# Patient Record
Sex: Female | Born: 1965 | Race: White | Hispanic: No | Marital: Married | Smoking: Former smoker
Health system: Southern US, Community
[De-identification: ages and names within clinical notes are randomized; demographics above are authoritative.]

## PROBLEM LIST (undated history)

## (undated) DIAGNOSIS — M1990 Unspecified osteoarthritis, unspecified site: Secondary | ICD-10-CM

## (undated) DIAGNOSIS — E559 Vitamin D deficiency, unspecified: Secondary | ICD-10-CM

## (undated) DIAGNOSIS — R1900 Intra-abdominal and pelvic swelling, mass and lump, unspecified site: Secondary | ICD-10-CM

## (undated) DIAGNOSIS — C801 Malignant (primary) neoplasm, unspecified: Secondary | ICD-10-CM

## (undated) DIAGNOSIS — G43909 Migraine, unspecified, not intractable, without status migrainosus: Secondary | ICD-10-CM

## (undated) DIAGNOSIS — J302 Other seasonal allergic rhinitis: Secondary | ICD-10-CM

## (undated) DIAGNOSIS — R140 Abdominal distension (gaseous): Secondary | ICD-10-CM

## (undated) DIAGNOSIS — K5900 Constipation, unspecified: Secondary | ICD-10-CM

## (undated) DIAGNOSIS — N6310 Unspecified lump in the right breast, unspecified quadrant: Secondary | ICD-10-CM

## (undated) DIAGNOSIS — Z9889 Other specified postprocedural states: Secondary | ICD-10-CM

## (undated) DIAGNOSIS — T7840XA Allergy, unspecified, initial encounter: Secondary | ICD-10-CM

## (undated) DIAGNOSIS — R112 Nausea with vomiting, unspecified: Secondary | ICD-10-CM

## (undated) HISTORY — PX: AUGMENTATION MAMMAPLASTY: SUR837

## (undated) HISTORY — PX: BREAST SURGERY: SHX581

## (undated) HISTORY — DX: Unspecified lump in the right breast, unspecified quadrant: N63.10

## (undated) HISTORY — PX: APPENDECTOMY: SHX54

## (undated) HISTORY — DX: Allergy, unspecified, initial encounter: T78.40XA

## (undated) HISTORY — DX: Malignant (primary) neoplasm, unspecified: C80.1

---

## 2012-04-23 HISTORY — PX: BREAST BIOPSY: SHX20

## 2012-07-29 ENCOUNTER — Ambulatory Visit: Payer: Self-pay | Admitting: Obstetrics & Gynecology

## 2012-07-31 ENCOUNTER — Encounter: Payer: Self-pay | Admitting: General Surgery

## 2012-07-31 ENCOUNTER — Ambulatory Visit (INDEPENDENT_AMBULATORY_CARE_PROVIDER_SITE_OTHER): Payer: PRIVATE HEALTH INSURANCE | Admitting: General Surgery

## 2012-07-31 VITALS — BP 100/60 | HR 76 | Resp 16 | Ht 68.0 in | Wt 127.0 lb

## 2012-07-31 DIAGNOSIS — N63 Unspecified lump in unspecified breast: Secondary | ICD-10-CM

## 2012-07-31 DIAGNOSIS — R920 Mammographic microcalcification found on diagnostic imaging of breast: Secondary | ICD-10-CM

## 2012-07-31 NOTE — Patient Instructions (Addendum)
Advised to have biopsy of right breast in our office at a later date. Pt had another appointment today

## 2012-07-31 NOTE — Progress Notes (Signed)
Patient ID: Sheryl Pratt, female   DOB: 09-16-1965, 47 y.o.   MRN: [id number]  Chief Complaint  Patient presents with  . Other    new patient mammogram follow up     HPI Sheryl Pratt is a 47 y.o. female who presents as a new patient to our office. She is following up from a mammogram that was done at [facility] on 07/23/12 with additional views at [facility] of the right breast on 07/29/12 with birad category 4. No prior problems with her breasts. Had breast argumentation surgery 18 years ago. Has bilateral saline implants  HPI  History reviewed. No pertinent past medical history.  Past Surgical History  Procedure Laterality Date  . Appendectomy    . Breast surgery  18 years ago    augmentation.    Family History  Problem Relation Age of Onset  . Cancer Brother     pancreatic    Social History History  Substance Use Topics  . Smoking status: Current Every Day Smoker -- 1.00 packs/day  . Smokeless tobacco: Never Used  . Alcohol Use: No    No Known Allergies  No current outpatient prescriptions on file.   No current facility-administered medications for this visit.    Review of Systems Review of Systems  Constitutional: Negative.   Respiratory: Negative.   Cardiovascular: Negative.     Blood pressure 100/60, pulse 76, resp. rate 16, height 5\' 8"  (1.727 m), weight 127 lb (57.607 kg), last menstrual period 07/02/2012.  Physical Exam Physical Exam  Constitutional: She appears well-developed and well-nourished.  Eyes: Conjunctivae are normal. No scleral icterus.  Neck: Trachea normal. No mass and no thyromegaly present.  Cardiovascular: Normal rate, regular rhythm, normal heart sounds and normal pulses.   No murmur heard. Pulmonary/Chest: Effort normal and breath sounds normal. Right breast exhibits no inverted nipple, no mass, no nipple discharge, no skin change and no tenderness. Left breast exhibits no inverted nipple, no mass, no nipple discharge, no skin change and no  tenderness. Breasts are symmetrical.  Both implants appear to be intact on physical exam.   Abdominal: Soft. Normal appearance and bowel sounds are normal. There is no tenderness. A hernia is present.  Small umbilical hernia noted on exam. Partially reducible.   Lymphadenopathy:    She has no cervical adenopathy.    She has no axillary adenopathy.    Data Reviewed Mammogram shoiws  Cluster of clacifications in right breast uoq and a nodule lateral right breast. US showed a cyst lateral right breast and a lobulated mass at 11 o'cl.  Assessment    Feel core biopsy of the mas in right breast with clip placement and subsequent mammogram would be reasonable. Location of calcifications and the mass seen on [place] appears to be same.     Plan    Core biopsy right breast mass.        Koraima Albertsen G 08/01/2012, 7:06 AM

## 2012-08-01 ENCOUNTER — Encounter: Payer: Self-pay | Admitting: General Surgery

## 2012-08-06 ENCOUNTER — Ambulatory Visit (INDEPENDENT_AMBULATORY_CARE_PROVIDER_SITE_OTHER): Payer: PRIVATE HEALTH INSURANCE | Admitting: General Surgery

## 2012-08-06 ENCOUNTER — Encounter: Payer: Self-pay | Admitting: General Surgery

## 2012-08-06 VITALS — BP 98/64 | HR 78 | Resp 16 | Ht 68.0 in | Wt 128.0 lb

## 2012-08-06 DIAGNOSIS — N63 Unspecified lump in unspecified breast: Secondary | ICD-10-CM

## 2012-08-06 DIAGNOSIS — N6310 Unspecified lump in the right breast, unspecified quadrant: Secondary | ICD-10-CM | POA: Insufficient documentation

## 2012-08-06 NOTE — Progress Notes (Signed)
Patient ID: Sheryl Pratt, female   DOB: 07-30-1965, 47 y.o.   MRN: [id number]  Chief Complaint  Patient presents with  . Procedure    right breast core biopsy    HPI Sheryl Pratt is a 47 y.o. female who presents today for a right breast core biopsy.  HPI  Past Medical History  Diagnosis Date  . Breast mass, right     Past Surgical History  Procedure Laterality Date  . Appendectomy    . Breast surgery  18 years ago    augmentation.    Family History  Problem Relation Age of Onset  . Cancer Brother     pancreatic    Social History History  Substance Use Topics  . Smoking status: Current Every Day Smoker -- 1.00 packs/day  . Smokeless tobacco: Never Used  . Alcohol Use: No    No Known Allergies  No current outpatient prescriptions on file.   No current facility-administered medications for this visit.    Review of Systems Review of Systems  Constitutional: Negative.   Respiratory: Negative.   Cardiovascular: Negative.     Blood pressure 98/64, pulse 78, resp. rate 16, height 5\' 8"  (1.727 m), weight 128 lb (58.06 kg), last menstrual period 07/02/2012.  Physical Exam Physical Exam  Data Reviewed    Assessment    Right breast mass     Plan    Biopsy performed.        Sheryl Pratt G 08/06/2012, 1:02 PM

## 2012-08-06 NOTE — Patient Instructions (Addendum)
CARE AFTER BREAST BIOPSY  1. Leave the dressing on that your doctor applied after surgery. It is waterproof. You may bathe, shower and/or swim. The dressing will probably remain intact until your return office visit. If the dressing comes off, you will see small strips of tape against your skin on the incision. Do not remove these strips.  2. You may want to use a gauze,cloth or similar protection in your bra to prevent rubbing against your dressing and incision. This is not necessary, but you may feel more comfortable doing so.  3. It is recommended that you wear a bra day and night to give support to the breast. This will prevent the weight of the breast from pulling on the incision.  4. Your breast will feel hard and lumpy under the incision. Do not be alarmed. This is the underlying stitching of tissue. Softening of this tissue will occur in time.  5. Make sure you call the office and schedule an appointment in one week after your surgery. The office phone number is [phone]. The nurses at [facility] may have already done this for you.  6. You will notice about a week after your office visit that the strips of the tape on your incision will begin to loosen. These may then be removed.  7. Report to your doctor any of the following:  * Severe pain not relieved by your pain medication  *Redness of the incision  * Drainage from the incision  *Fever greater than 101 degrees  Patient to have a unilateral right breast diagnostic mammogram in one month. This has been arranged at [facility] for 09-09-12 at 10 am. She is aware of date, time, and instructions.

## 2012-08-07 ENCOUNTER — Encounter: Payer: Self-pay | Admitting: General Surgery

## 2012-08-07 ENCOUNTER — Telehealth: Payer: Self-pay | Admitting: *Deleted

## 2012-08-07 LAB — PATHOLOGY

## 2012-08-07 NOTE — Telephone Encounter (Signed)
Left message on cell  Number.

## 2012-08-07 NOTE — Telephone Encounter (Signed)
Message copied by Currie Paris on Thu Aug 07, 2012  5:21 PM ------      Message from: Kieth Brightly      Created: Thu Aug 07, 2012  3:49 PM       Please let pt pt know the pathology was normal. Arrange for 2 month f/u ------

## 2012-08-11 ENCOUNTER — Encounter: Payer: Self-pay | Admitting: General Surgery

## 2012-08-20 ENCOUNTER — Telehealth: Payer: Self-pay | Admitting: *Deleted

## 2012-08-20 NOTE — Telephone Encounter (Signed)
Patient called stating insurance will not pay for mammogram. This patient wants to know if she can cancel mammogram (scheduled for 09-09-12) if it was not necessary. Patient's office visit follow up is scheduled for 09-17-12. Please advise.

## 2012-08-25 ENCOUNTER — Encounter: Payer: Self-pay | Admitting: General Surgery

## 2012-09-17 ENCOUNTER — Ambulatory Visit: Payer: PRIVATE HEALTH INSURANCE | Admitting: General Surgery

## 2012-09-24 ENCOUNTER — Encounter: Payer: Self-pay | Admitting: *Deleted

## 2013-04-07 ENCOUNTER — Ambulatory Visit: Payer: Self-pay | Admitting: Obstetrics & Gynecology

## 2013-04-07 LAB — CBC
MCH: 32.5 pg (ref 26.0–34.0)
MCHC: 33.9 g/dL (ref 32.0–36.0)
MCV: 96 fL (ref 80–100)
RDW: 13.3 % (ref 11.5–14.5)
WBC: 7.6 10*3/uL (ref 3.6–11.0)

## 2013-04-14 ENCOUNTER — Ambulatory Visit: Payer: Self-pay | Admitting: Obstetrics & Gynecology

## 2013-04-23 HISTORY — PX: VAGINAL HYSTERECTOMY: SUR661

## 2013-05-27 LAB — PATHOLOGY REPORT

## 2013-09-08 ENCOUNTER — Telehealth: Payer: Self-pay | Admitting: *Deleted

## 2013-09-08 NOTE — Telephone Encounter (Signed)
Called pt and left message to notify of balance after insurance payment

## 2014-02-22 ENCOUNTER — Encounter: Payer: Self-pay | Admitting: General Surgery

## 2014-08-14 NOTE — Op Note (Signed)
PATIENT NAME:  Sheryl Pratt, Sheryl Pratt MR#:  [id number] DATE OF BIRTH:  Jan 02, 1966  DATE OF PROCEDURE:  04/14/2013  PREOPERATIVE DIAGNOSIS:  Pelvic organ prolapse with uterine prolapse.  POSTOPERATIVE DIAGNOSIS:  Pelvic organ prolapse with uterine prolapse.  PROCEDURES PERFORMED: 1.  Total vaginal hysterectomy.  2.  Modified McCall culdoplasty.   SURGEON: Glean Salen, MD  ANESTHESIA: Spinal.   ESTIMATED BLOOD LOSS: 50 mL  COMPLICATIONS: None.   FINDINGS: Uterine prolapse with normal ovaries.   DISPOSITION: Recovery room stable.   TECHNIQUE: The patient is prepped and draped in the usual sterile fashion after adequate anesthesia is obtained in the dorsal lithotomy position. Foley catheter is inserted. The tenaculum is used to grasp the cervix with gentle traction applied. The circumference of the cervix is infiltrated with 1% lidocaine with epinephrine and then is incised with Bovie electrocautery. Posterior peritoneum is then dissected and penetrated and a long weighted speculum is placed. The uterosacral ligaments are clamped, transected and suture ligated and then sutured to the vaginal cuff. The uterine arteries are clamped, transected and suture ligated. Anterior peritoneum is penetrated with a retractor placed. The remaining portion of the broad ligament complex and adnexa is then sequentially clamped, transected and suture ligated until the uterus is completely amputated. Excellent hemostasis is noted and there are no apparent injuries to ureter or bowel or bladder.   The peritoneum is closed with a 1 Vicryl suture in a pursestring fashion incorporating the anterior peritoneum, posterior peritoneum and peritoneum along the sidewall near the adnexa. An additional Ethibond suture is placed in the posterior peritoneum to plicate the uterosacral ligaments. The vaginal mucosa is then closed using a 2-0 Vicryl suture in a running locking fashion. The vaginal cavity is irrigated and a sponge with  AVC cream applied, is placed vaginally and for overnight use. Foley catheter is also left in. The patient goes to the recovery room in stable condition. All sponge, instrument and needle counts are correct.   ____________________________ R. Barnett Applebaum, MD rph:ce D: 04/14/2013 15:16:10 ET T: 04/14/2013 15:29:50 ET JOB#: [id number]  cc: Glean Salen, MD, <Dictator> Gae Dry MD ELECTRONICALLY SIGNED 04/29/2013 7:17

## 2019-02-04 ENCOUNTER — Encounter: Payer: Self-pay | Admitting: Family Medicine

## 2019-02-04 ENCOUNTER — Ambulatory Visit (INDEPENDENT_AMBULATORY_CARE_PROVIDER_SITE_OTHER): Payer: 59 | Admitting: Family Medicine

## 2019-02-04 ENCOUNTER — Telehealth: Payer: Self-pay | Admitting: Family Medicine

## 2019-02-04 ENCOUNTER — Other Ambulatory Visit: Payer: Self-pay

## 2019-02-04 VITALS — BP 115/70 | HR 67 | Temp 97.8°F | Resp 16 | Ht 67.0 in | Wt 144.6 lb

## 2019-02-04 DIAGNOSIS — Z90710 Acquired absence of both cervix and uterus: Secondary | ICD-10-CM | POA: Diagnosis not present

## 2019-02-04 DIAGNOSIS — F17200 Nicotine dependence, unspecified, uncomplicated: Secondary | ICD-10-CM | POA: Insufficient documentation

## 2019-02-04 DIAGNOSIS — Z7689 Persons encountering health services in other specified circumstances: Secondary | ICD-10-CM | POA: Diagnosis not present

## 2019-02-04 DIAGNOSIS — Z789 Other specified health status: Secondary | ICD-10-CM | POA: Diagnosis not present

## 2019-02-04 DIAGNOSIS — Z23 Encounter for immunization: Secondary | ICD-10-CM

## 2019-02-04 DIAGNOSIS — Z9049 Acquired absence of other specified parts of digestive tract: Secondary | ICD-10-CM

## 2019-02-04 MED ORDER — ZOSTER VAC RECOMB ADJUVANTED 50 MCG/0.5ML IM SUSR: 0.5000 mL | Freq: Once | INTRAMUSCULAR | 0 refills | Status: AC

## 2019-02-04 NOTE — Telephone Encounter (Signed)
Error

## 2019-02-04 NOTE — Progress Notes (Signed)
New patient office visit note:  Impression and Recommendations:    1. Establishing care with new doctor, encounter for   2. S/P hysterectomy-without oophrectomy   3. Hx of appendectomy   4. Vegan diet   5. Smoker- 10 pack yr or less   6. Need for viral immunization     Encounter to Establish Care with New Doctor - Extensive discussion held with patient regarding establishing as a new patient.  Discussed policies and practices here at the clinic, and answered all questions about care team and health management during appointment.  - Discussed need for patient to continue to obtain management and screenings with all established specialists.  Educated patient at length about the critical importance of keeping health maintenance up to date.  - Discussed need to obtain baseline fasting lab work.  - Discussed need for immunizations with patient during appointment today. - Influenza vaccination declined in office today, but patient indicates interest in obtaining elsewhere.  - Extensive education provided regarding immunizations and screenings.  - Participated in lengthy conversation and all questions were answered.   Female Health -  recommended keeping follow-up with OBGYN. - Patient understands if she is interested in pursuing hormone replacement- she will need to obtain from GYN    Smoking Cessation Counseling - Current Smoker, 10 Pack-Years - Told pt to think seriously about quitting smoking completely!   - Told pt it is very important for his/her health and well being.   - Smoking cessation instruction/ counseling given of at least 5 minutes:  counseled patient on the dangers of tobacco use and reviewed strategies to maximize success  - Discussed with patient that there are multiple treatments to aid in quitting smoking, however I explained none will work unless pt really wants to quit  - Told to call 1-800-QUIT-NOW 347-210-3997) for free smoking cessation  counseling and support, or pt can go online to www.heart.org - the American Heart Association website and search "quit smoking ".   - To replace smoking habit with other stress relief activities, encouraged patient to engage in meditation, and utilize assistance such as free meditation apps available for the phone.   Health Counseling & Preventative Health Maintenance - Advised patient to continue working toward exercising to improve overall mental, physical, and emotional health.    - Reviewed the "spokes of the wheel" of mood and health management.  Stressed the importance of ongoing prudent habits, including regular exercise, appropriate sleep hygiene, healthful dietary habits, and prayer/meditation to relax.  - Encouraged patient to engage in daily physical activity, especially a formal exercise routine.  Recommended that the patient eventually strive for at least 150 minutes of moderate cardiovascular activity per week according to guidelines established by the [facility].   - Healthy dietary habits encouraged, including low-carb, and high amounts of lean protein in diet.   - Patient should also consume adequate amounts of water.  - Health counseling performed.  All questions answered.   Education and routine counseling performed. Handouts provided.   Recommendations - Return in near future for CPE and full fasting lab work same day. - Discussed that all historical records should be sent to clinic as advised.   Meds ordered this encounter  Medications  . Zoster Vaccine Adjuvanted [facility]) injection    Sig: Inject 0.5 mLs into the muscle once for 1 dose.    Dispense:  0.5 mL    Refill:  0   Gross side effects, risk and benefits, and  alternatives of medications discussed with patient.  Patient is aware that all medications have potential side effects and we are unable to predict every side effect or drug-drug interaction that may occur.  Expresses verbal understanding and consents to  current therapy plan and treatment regimen.  Return for f/up for CPE and FBW same day next available .  Please see AVS handed out to patient at the end of our visit for further patient instructions/ counseling done pertaining to today's office visit.    Note:  This document was prepared using Dragon voice recognition software and may include unintentional dictation errors.   This document serves as a record of services personally performed by [name], DO. It was created on her behalf by Toni Amend, a trained medical scribe. The creation of this record is based on the scribe's personal observations and the provider's statements to them.   I have reviewed the above medical documentation for accuracy and completeness and I concur.  [name], DO 02/07/2019 2:46 PM       ---------------------------------------------------------------------------------------------------------------------------------------------------------------------------------------------    Subjective:    Chief complaint:   Chief Complaint  Patient presents with  . New Patient (Initial Visit)     HPI: Sheryl Pratt is a pleasant 53 y.o. female who presents to [facility] today to review their medical history with me and establish care.   I asked the patient to review their chronic problem list with me to ensure everything was updated and accurate.    All recent office visits with other providers, any medical records that patient brought in etc  - I reviewed today.     We asked pt to get [place] their medical records from [facility] providers/ specialists that they had seen within the past 3-5 years- if they are in private practice and/or do not work for [employer], [facility], [facility], [facility] or [employer] owned practice.  Told them to call their specialists to clarify this if they are not sure.    Reason for Establishing Care: notes doesn't normally go to the doctor.  Indicates  she's never really felt like she needed a doctor.  Social History Recently moved from [place].  Married to husband [name], monogamous. Married for 31 years, together prior to that. 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  Worked outside the home until her sister "got really sick, then sister's husband got myasthenia gravis and also got really sick."  Patient states she stays with them a lot now.  "They didn't have children, so I'm kind of all they've got."  Notes has been taking care of them for four years and worries about her sister and BIL a lot.  "They rely on me quite a bit.  They live in a huge [employer], with all this stuff they never use, and I don't think they want to face how serious it is."  "They don't want to listen to me and that's my biggest stress.  I mean I don't have to do it, but you know."  States her sister is 34 years older than her and "kind of like a mother figure to me and raised me."  "My children are like her grandchildren."  Has two kids, son and daughter. No grandchildren, but has "grand puppies."  Diet Is a vegan. Notes when she's at home, she's 100% vegan. Says she does not consume dairy.  Says "I'm an outside person,  I garden all the time, in sunlight a lot." Has a vegetable garden and is into flowers, "I do it all."  Took caffeine out of her diet but still doesn't sleep that well.  Cigarette Smoking - has quit many times Current smoker; started at age 29. Notes "I've been an on and off smoker all these years." Thinks she's smoking two packs per week these days. Thinks she smokes six cigarettes per day, quarter pack. 10 pack-year history.  Smoked a pack per day when she was "really young."  Quits for years at a time and starts back "because I love 'em." Mostly a quarter pack per day for many years.  Has used several methods to quit, including Wellbutrin, the patch. Only took  Wellbutrin 2-3 days and quit because she didn't sleep. Has not tried Chantix in the past.  Alcohol Use Drinks occasionally.  Family History Has a sister with PSP (progressive supranuclear palsy). Sister is wheelchair bound, has high blood pressure now. Is completely in a wheelchair and requires people to feed her now. Notes "was more of a health nut than me, a beautiful woman with a lot of energy."  Notes several siblings; "there are four girls, and there were three boys, but one has passed away of pancreatic cancer in 1993-08-10."  Notes brother who died at age 30 was a Insurance underwriter.  Past Medical History Never been told she has diabetes, high blood pressure, high cholesterol.  Notes "I've been fortunate, I just haven't been sick."  Occasionally takes ibuprofen or tylenol for headache.  Has seasonal allergies and "sometimes takes a benadryl, but that's all I take."  Has not been having regular lab work.  Hasn't been to the doctor since 2015-08-11.  Notes codeine has made her nauseous in the past.  - Female Health Had hysterectomy but still has ovaries.  Notes she wanted to keep her ovaries because "I really didn't want to have to take hormone replacement."  "I really don't like taking medicine."  Used to follow up with OBGYN. Last pap smear obtained in 2015-08-11.   - Sleep & Mood Says "I've never slept okay."  Notes typically can go to sleep fine, but only sleeps two hours at a time.  Says "if I slept six hours, that would be a good night for me; but I've always been that way in my adult life, I don't remember anything different."  Notes "my mother was that way, not a big sleeper either."  Confirms she does feel some anxiety and depression about her sister's health.  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, "I'm pretty physical," and "sometimes if I'm in the house for a couple of days and not getting  exercise, I feel a little stressed, but I do Pilates then."  Says she doesn't exercise "unless I have to."  - Thinning Hair Notes she's losing hair and states "I'm getting like, spots on my head."  States "it's really thin."  Says she takes Biotin but "I don't think it's helping much."    Wt Readings from Last 3 Encounters:  02/04/19 144 lb 9.6 oz (65.6 kg)  08/06/12 128 lb (58.1 kg)  07/31/12 127 lb (57.6 kg)   BP Readings from Last 3 Encounters:  02/04/19 115/70  08/06/12 98/64  07/31/12 100/60   Pulse Readings from Last 3 Encounters:  02/04/19 67  08/06/12 78  07/31/12 76   BMI Readings  from Last 3 Encounters:  02/04/19 22.65 kg/m  08/06/12 19.46 kg/m  07/31/12 19.31 kg/m    Patient Care Team    Relationship Specialty Notifications Start End  [name], DO PCP - General Family Medicine  02/04/19   Christene Lye, MD  General Surgery  07/31/12   [facility]    02/04/19     Patient Active Problem List   Diagnosis Date Noted  . Hx of appendectomy 02/04/2019  . Status post hysterectomy with oophorectomy-2015 02/04/2019  . Vegan diet 02/04/2019  . Smoker- 10 pack yr or less 02/04/2019  . Breast mass, right   . Breast microcalcification, mammographic 07/31/2012  . Lump or mass in breast 07/31/2012       As reported by pt:  Past Medical History:  Diagnosis Date  . Allergy   . Breast mass, right      Past Surgical History:  Procedure Laterality Date  . ABDOMINAL HYSTERECTOMY  2015  . APPENDECTOMY    . BREAST SURGERY  18 years ago   augmentation.     Family History  Problem Relation Age of Onset  . Cancer Brother        pancreatic     Social History   Substance and Sexual Activity  Drug Use No     Social History   Substance and Sexual Activity  Alcohol Use No     Social History   Tobacco Use  Smoking Status Current Every Day Smoker  . Packs/day: 1.00  Smokeless Tobacco Never Used     Current  Meds  Medication Sig  . Ascorbic Acid (VITAMIN C) 100 MG tablet Take 100 mg by mouth daily.  . Biotin 10 MG CAPS Take 1 capsule by mouth daily.  . cyanocobalamin 100 MCG tablet Take 100 mcg by mouth daily.  . Multiple Vitamin (MULTIVITAMIN) tablet Take 1 tablet by mouth daily.    Allergies: Codeine and Wellbutrin [bupropion]   Review of Systems  Constitutional: Negative for chills, diaphoresis, fever, malaise/fatigue and weight loss.  HENT: Negative for congestion, sore throat and tinnitus.   Eyes: Negative for blurred vision, double vision and photophobia.  Respiratory: Negative for cough and wheezing.   Cardiovascular: Negative for chest pain and palpitations.  Gastrointestinal: Negative for blood in stool, diarrhea, nausea and vomiting.  Genitourinary: Negative for dysuria, frequency and urgency.  Musculoskeletal: Negative for joint pain and myalgias.  Skin: Negative for itching and rash.  Neurological: Negative for dizziness, focal weakness, weakness and headaches.  Endo/Heme/Allergies: Negative for environmental allergies and polydipsia. Does not bruise/bleed easily.  Psychiatric/Behavioral: Negative for depression and memory loss. The patient is not nervous/anxious and does not have insomnia.         Objective:   Blood pressure 115/70, pulse 67, temperature 97.8 F (36.6 C), resp. rate 16, height 5\' 7"  (1.702 m), weight 144 lb 9.6 oz (65.6 kg), last menstrual period 07/02/2012, SpO2 99 %. Body mass index is 22.65 kg/m. General: Well Developed, well nourished, and in no acute distress.  Neuro: Alert and oriented x3, extra-ocular muscles intact, sensation grossly intact.  HEENT:[place]/AT, PERRLA, neck supple, No carotid bruits Skin: no gross rashes  Cardiac: Regular rate and rhythm Respiratory: Essentially clear to auscultation bilaterally. Not using accessory muscles, speaking in full sentences.  Abdominal: not grossly distended Musculoskeletal: Ambulates w/o diff, FROM *  4 ext.  Vasc: less 2 sec cap RF, warm and pink  Psych:  No HI/SI, judgement and insight good, Euthymic mood. Full Affect.  No results found for this or any previous visit (from the past 2160 hour(s)).

## 2019-02-04 NOTE — Patient Instructions (Signed)
For meditation, look into the Calm App and Headspace App for the phone. You may also search for stress relief meditations on YouTube and online.  Please realize, EXERCISE IS MEDICINE!  -  American Heart Association [facility]) guidelines for exercise : If you are in good health, without any medical conditions, you should engage in 150-300 minutes of moderate intensity aerobic activity per week.  This means you should be huffing and puffing throughout your workout.   Engaging in regular exercise will improve brain function and memory, as well as improve mood, boost immune system and help with weight management.  As well as the other, more well-known effects of exercise such as decreasing blood sugar levels, decreasing blood pressure,  and decreasing bad cholesterol levels/ increasing good cholesterol levels.     -  The AHA strongly endorses consumption of a diet that contains a variety of foods from all the food categories with an emphasis on fruits and vegetables; fat-free and low-fat dairy products; cereal and grain products; legumes and nuts; and fish, poultry, and/or extra lean meats.    Excessive food intake, especially of foods high in saturated and trans fats, sugar, and salt, should be avoided.    Adequate water intake of roughly 1/2 of your weight in pounds, should equal the ounces of water per day you should drink.  So for instance, if you're 200 pounds, that would be 100 ounces of water per day.         Mediterranean Diet  Why follow it? Research shows  Those who follow the Mediterranean diet have a reduced risk of heart disease   The diet is associated with a reduced incidence of Parkinson's and Alzheimer's diseases  People following the diet may have longer life expectancies and lower rates of chronic diseases   The Dietary Guidelines for Americans recommends the Mediterranean diet as an eating plan to promote health and prevent disease  What Is the Mediterranean Diet?   Healthy  eating plan based on typical foods and recipes of Mediterranean-style cooking  The diet is primarily a plant based diet; these foods should make up a majority of meals   Starches - Plant based foods should make up a majority of meals - They are an important sources of vitamins, minerals, energy, antioxidants, and fiber - Choose whole grains, foods high in fiber and minimally processed items  - Typical grain sources include wheat, oats, barley, corn, Shan rice, bulgar, farro, millet, polenta, couscous  - Various types of beans include chickpeas, lentils, fava beans, black beans, white beans   Fruits  Veggies - Large quantities of antioxidant rich fruits & veggies; 6 or more servings  - Vegetables can be eaten raw or lightly drizzled with oil and cooked  - Vegetables common to the traditional Mediterranean Diet include: artichokes, arugula, beets, broccoli, brussel sprouts, cabbage, carrots, celery, collard greens, cucumbers, eggplant, kale, leeks, lemons, lettuce, mushrooms, okra, onions, peas, peppers, potatoes, pumpkin, radishes, rutabaga, shallots, spinach, sweet potatoes, turnips, zucchini - Fruits common to the Mediterranean Diet include: apples, apricots, avocados, cherries, clementines, dates, figs, grapefruits, grapes, melons, nectarines, oranges, peaches, pears, pomegranates, strawberries, tangerines  Fats - Replace butter and margarine with healthy oils, such as olive oil, canola oil, and tahini  - Limit nuts to no more than a handful a day  - Nuts include walnuts, almonds, pecans, pistachios, pine nuts  - Limit or avoid candied, honey roasted or heavily salted nuts - Olives are central to the Mediterranean diet - can  be eaten whole or used in a variety of dishes   Meats Protein - Limiting red meat: no more than a few times a month - When eating red meat: choose lean cuts and keep the portion to the size of deck of cards - Eggs: approx. 0 to 4 times a week  - Fish and lean poultry: at  least 2 a week  - Healthy protein sources include, chicken, [place], lean beef, lamb - Increase intake of seafood such as tuna, salmon, trout, mackerel, shrimp, scallops - Avoid or limit high fat processed meats such as sausage and bacon  Dairy - Include moderate amounts of low fat dairy products  - Focus on healthy dairy such as fat free yogurt, skim milk, low or reduced fat cheese - Limit dairy products higher in fat such as whole or 2% milk, cheese, ice cream  Alcohol - Moderate amounts of red wine is ok  - No more than 5 oz daily for women (all ages) and men older than age 80  - No more than 10 oz of wine daily for men younger than 5  Other - Limit sweets and other desserts  - Use herbs and spices instead of salt to flavor foods  - Herbs and spices common to the traditional Mediterranean Diet include: basil, bay leaves, chives, cloves, cumin, fennel, garlic, lavender, marjoram, mint, oregano, parsley, pepper, rosemary, sage, savory, sumac, tarragon, thyme   Its not just a diet, its a lifestyle:   The Mediterranean diet includes lifestyle factors typical of those in the region   Foods, drinks and meals are best eaten with others and savored  Daily physical activity is important for overall good health  This could be strenuous exercise like running and aerobics  This could also be more leisurely activities such as walking, housework, yard-work, or taking the stairs  Moderation is the key; a balanced and healthy diet accommodates most foods and drinks  Consider portion sizes and frequency of consumption of certain foods   Meal Ideas & Options:   Breakfast:  o Whole wheat toast or whole wheat English muffins with peanut butter & hard boiled egg o Steel cut oats topped with apples & cinnamon and skim milk  o Fresh fruit: banana, strawberries, melon, berries, peaches  o Smoothies: strawberries, bananas, greek yogurt, peanut butter o Low fat greek yogurt with blueberries and  granola  o Egg white omelet with spinach and mushrooms o Breakfast couscous: whole wheat couscous, apricots, skim milk, cranberries   Sandwiches:  o Hummus and grilled vegetables (peppers, zucchini, squash) on whole wheat bread   o Grilled chicken on whole wheat pita with lettuce, tomatoes, cucumbers or tzatziki  o Tuna salad on whole wheat bread: tuna salad made with greek yogurt, olives, red peppers, capers, green onions o Garlic rosemary lamb pita: lamb sauted with garlic, rosemary, salt & pepper; add lettuce, cucumber, greek yogurt to pita - flavor with lemon juice and black pepper   Seafood:  o Mediterranean grilled salmon, seasoned with garlic, basil, parsley, lemon juice and black pepper o Shrimp, lemon, and spinach whole-grain pasta salad made with low fat greek yogurt  o Seared scallops with lemon orzo  o Seared tuna steaks seasoned salt, pepper, coriander topped with tomato mixture of olives, tomatoes, olive oil, minced garlic, parsley, green onions and cappers   Meats:  o Herbed greek chicken salad with kalamata olives, cucumber, feta  o Red bell peppers stuffed with spinach, bulgur, lean ground beef (or lentils) &  topped with feta   o Kebabs: skewers of chicken, tomatoes, onions, zucchini, squash  o [place] burgers: made with red onions, mint, dill, lemon juice, feta cheese topped with roasted red peppers  Vegetarian o Cucumber salad: cucumbers, artichoke hearts, celery, red onion, feta cheese, tossed in olive oil & lemon juice  o Hummus and whole grain pita points with a greek salad (lettuce, tomato, feta, olives, cucumbers, red onion) o Lentil soup with celery, carrots made with vegetable broth, garlic, salt and pepper  o Tabouli salad: parsley, bulgur, mint, scallions, cucumbers, tomato, radishes, lemon juice, olive oil, salt and pepper.

## 2019-03-10 ENCOUNTER — Other Ambulatory Visit: Payer: 59

## 2019-03-10 ENCOUNTER — Ambulatory Visit (INDEPENDENT_AMBULATORY_CARE_PROVIDER_SITE_OTHER): Payer: 59 | Admitting: Family Medicine

## 2019-03-10 ENCOUNTER — Other Ambulatory Visit: Payer: Self-pay

## 2019-03-10 ENCOUNTER — Encounter: Payer: Self-pay | Admitting: Family Medicine

## 2019-03-10 VITALS — BP 96/66 | HR 76 | Temp 97.7°F | Resp 10 | Ht 67.0 in | Wt 140.7 lb

## 2019-03-10 DIAGNOSIS — Z0000 Encounter for general adult medical examination without abnormal findings: Secondary | ICD-10-CM | POA: Diagnosis not present

## 2019-03-10 DIAGNOSIS — Z1231 Encounter for screening mammogram for malignant neoplasm of breast: Secondary | ICD-10-CM | POA: Diagnosis not present

## 2019-03-10 DIAGNOSIS — L659 Nonscarring hair loss, unspecified: Secondary | ICD-10-CM

## 2019-03-10 DIAGNOSIS — Z719 Counseling, unspecified: Secondary | ICD-10-CM

## 2019-03-10 DIAGNOSIS — Z23 Encounter for immunization: Secondary | ICD-10-CM

## 2019-03-10 DIAGNOSIS — Z1211 Encounter for screening for malignant neoplasm of colon: Secondary | ICD-10-CM

## 2019-03-10 NOTE — Progress Notes (Signed)
Impression and Recommendations:    1. Encounter for wellness examination   2. Health education/counseling   3. Encounter for screening mammogram for malignant neoplasm of breast   4. Need for influenza vaccination   5. Screening for colon cancer   6. Hair loss disorder      1) Anticipatory Guidance: Discussed importance of wearing a seatbelt while driving, not texting while driving; sunscreen when outside along with yearly skin surveillance; eating a well balanced and modest diet; physical activity at least 25 minutes per day or 150 min/ week of moderate to intense activity.  - Prudent home skin surveillance discussed with patient today.  Advised patient to watch for the A,B,C,D's of skin surveillance. - Ambulatory referral to dermatology placed today due to concerns of hair loss.  Patient desires dermatology referral for treatment of balding spots.  2) Immunizations / Screenings / Labs:   All immunizations and screenings that patient agrees to, are up-to-date per recommendations or will be updated today.  Patient understands the needs for q 42mo dental and yearly vision screens which pt will schedule independently. Obtain CBC, CMP, HgA1c, Lipid panel, TSH and vit D when fasting if not already done recently.   - Fasting blood work obtained today.  - Per patient, last mammogram likely in 2014. - Need for mammogram.  Referral placed today. - Reviewed recommendation for yearly mammogram.  - Per patient, has never had a colonoscopy. - Reviewed recommendations with patient today. - Discussed colonoscopy as gold standard, vs option of ColoGuard. - Patient prefers ColoGuard.  Order placed today and information provided.  - Need for TDAP vaccination.  Per pt, has been 22 years since last time she obtained the TDAP vaccination because "I got a high fever and I ached and felt so sick." - TDAP information sheet provided to patient today.  - Per patient, obtained shingles vaccination  recently, Thursday. - Advised patient to obtain second round of vaccination as scheduled.  - Reviewed need for low-risk Hep C/HIV screen.  - Extensive education provided today regarding need for immunizations and screenings.  Lengthy conversation held and all questions answered.  - Need for influenza vaccination today.  3) Weight:   Discussed goal of losing even 5-10% of current body weight which would improve overall feelings of well being and improve objective health data significantly.   Improve nutrient density of diet through increasing intake of fruits and vegetables and decreasing saturated/trans fats, white flour products and refined sugar products.    Orders Placed This Encounter  Procedures  . MM Digital Diagnostic Bilat  . Flu Vaccine QUAD 6+ mos PF IM (Fluarix Quad PF)  . Cologuard  . Ambulatory referral to Dermatology    Return for f/up near future for discussion of labs via [facility] 1st time per pt preference.   Gross side effects, risk and benefits, and alternatives of medications discussed with patient.  Patient is aware that all medications have potential side effects and we are unable to predict every side effect or drug-drug interaction that may occur.  Expresses verbal understanding and consents to current therapy plan and treatment regimen.  F-up preventative CPE in 1 year- reminded pt again, this is in addition to any chronic care visits.   Please see orders placed and AVS handed out to patient at the end of our visit for further patient instructions/ counseling done pertaining to today's office visit.  This document serves as a record of services personally performed by Sheryl Dance, DO.  It was created on her behalf by Sheryl Pratt, a trained medical scribe. The creation of this record is based on the scribe's personal observations and the provider's statements to them.   This case required medical decision making of at least moderate complexity. The above  documentation has been reviewed to be accurate and was completed by Sheryl Pratt, D.O.    Subjective:    I, Sheryl Pratt, am serving as scribe for Dr. Mellody Pratt.  Chief Complaint  Patient presents with  . Annual Exam    HPI: Sheryl Pratt is a 53 y.o. female who presents to [facility] today a yearly health maintenance exam.  Health Maintenance Summary Reviewed and updated, unless pt declines services.  Colonoscopy:  Has not had a colonoscopy; states "I don't like it."  "I just don't like being put to sleep." Tobacco History Reviewed:   Y; current every day smoker 0.5 ppd.  CT scan for screening lung CA:   n/a Alcohol:    No concerns, no excessive use Exercise Habits:  meeting AHA guidelines STD concerns:   none Drug Use:   None Birth control method:  S/p hysterectomy. Menses regular:  S/p hysterectomy. Lumps or breast concerns:      No, has breat implants b/l Breast or Colon Cancer Family History:      No No h/o melanoma in family  - Eye Health Had her eyes checked a little over a year ago.  - Dental Health Last had her teeth cleaned a couple of weeks ago.  - Female Health Followed up with OBGYN in [place].  Notes still has her ovaries, but does not have a cervix.  Says "he told me once every five years that I should go get checked."  Thinks her last mammogram was 2014.  Notes "I haven't had a doctor and haven't been to a doctor in about seven years."  - Dermatological Health Notes gets skin tags sometimes and "puts Vitamin C on them and they fall off."  Notes she orders a naked brand liquid Vitamin C online.  Says she puts it on scratches and other skin concerns to reduce/eliminate them.  Per patient, her hair is falling out in "kinda like patches," and she is concerned about this.    Immunization History  Administered Date(s) Administered  . Influenza,inj,Quad PF,6+ Mos 03/10/2019  . Zoster Recombinat (Shingrix)  03/04/2019    Health Maintenance  Topic Date Due  . Hepatitis C Screening  1965/05/21  . HIV Screening  10/27/1978  . TETANUS/TDAP  10/27/1982  . PAP SMEAR-Modifier  10/26/1984  . MAMMOGRAM  07/30/2014  . INFLUENZA VACCINE  11/22/2018  . COLONOSCOPY  Discontinued     Wt Readings from Last 3 Encounters:  03/10/19 140 lb 11.2 oz (63.8 kg)  02/04/19 144 lb 9.6 oz (65.6 kg)  08/06/12 128 lb (58.1 kg)   BP Readings from Last 3 Encounters:  03/10/19 96/66  02/04/19 115/70  08/06/12 98/64   Pulse Readings from Last 3 Encounters:  03/10/19 76  02/04/19 67  08/06/12 78     Past Medical History:  Diagnosis Date  . Allergy   . Breast mass, right       Past Surgical History:  Procedure Laterality Date  . ABDOMINAL HYSTERECTOMY  2015  . APPENDECTOMY    . BREAST SURGERY  18 years ago   augmentation.      Family History  Problem Relation Age of Onset  . Cancer Brother  pancreatic      Social History   Substance and Sexual Activity  Drug Use No  ,   Social History   Substance and Sexual Activity  Alcohol Use No  ,   Social History   Tobacco Use  Smoking Status Current Every Day Smoker  . Packs/day: 0.50  Smokeless Tobacco Never Used  ,   Social History   Substance and Sexual Activity  Sexual Activity Yes  . Birth control/protection: None    Current Outpatient Medications on File Prior to Visit  Medication Sig Dispense Refill  . Ascorbic Acid (VITAMIN C) 100 MG tablet Take 100 mg by mouth daily.    . Biotin 10 MG CAPS Take 1 capsule by mouth daily.    . cyanocobalamin 100 MCG tablet Take 100 mcg by mouth daily.    . Multiple Vitamin (MULTIVITAMIN) tablet Take 1 tablet by mouth daily.     No current facility-administered medications on file prior to visit.     Allergies: Codeine and Wellbutrin [bupropion]  Review of Systems: General:   Denies fever, chills, unexplained weight loss.  Optho/Auditory:   Denies visual changes, blurred  vision/LOV Respiratory:   Denies SOB, DOE more than baseline levels.   Cardiovascular:   Denies chest pain, palpitations, new onset peripheral edema  Gastrointestinal:   Denies nausea, vomiting, diarrhea.  Genitourinary: Denies dysuria, freq/ urgency, flank pain or discharge from genitals.  Endocrine:     Denies hot or cold intolerance, polyuria, polydipsia. Musculoskeletal:   Denies unexplained myalgias, joint swelling, unexplained arthralgias, gait problems.  Skin:  Denies rash, suspicious lesions Neurological:     Denies dizziness, unexplained weakness, numbness  Psychiatric/Behavioral:   Denies mood changes, suicidal or homicidal ideations, hallucinations    Objective:    Blood pressure 96/66, pulse 76, temperature 97.7 F (36.5 C), temperature source Oral, resp. rate 10, height 5\' 7"  (1.702 m), weight 140 lb 11.2 oz (63.8 kg), last menstrual period 07/02/2012, SpO2 99 %. Body mass index is 22.04 kg/m. General Appearance:    Alert, cooperative, no distress, appears stated age  Head:    Normocephalic, without obvious abnormality, atraumatic  Eyes:    PERRL, conjunctiva/corneas clear, EOM's intact, fundi    benign, both eyes  Ears:    Normal TM's and external ear canals, both ears  Nose:   Nares normal, septum midline, mucosa normal, no drainage    or sinus tenderness  Throat:   Lips w/o lesion, mucosa moist, and tongue normal; teeth and   gums normal  Neck:   Supple, symmetrical, trachea midline, no adenopathy;    thyroid:  no enlargement/tenderness/nodules; no carotid   bruit or JVD  Back:     Symmetric, no curvature, ROM normal, no CVA tenderness  Lungs:     Clear to auscultation bilaterally, respirations unlabored, no       Wh/ R/ R  Chest Wall:    No tenderness or gross deformity; normal excursion   Heart:    Regular rate and rhythm, S1 and S2 normal, no murmur, rub   or gallop  Breast Exam:     Bilateral implants present but no masses appreciated, normal palpation; no  tenderness, masses, or nipple abnormality b/l; no d/c  Abdomen:     Soft, non-tender, bowel sounds active all four quadrants, NO   G/R/R, no masses, no organomegaly  Genitalia:    Ext genitalia: without lesion, no rash or discharge, No         tenderness;  Cervix: s/p  hysterectomy, no cervix present;        Adnexa:  No tenderness or palpable masses   Rectal:     Deferred- cologuard this yr  Extremities:   Extremities normal, atraumatic, no cyanosis or gross edema  Pulses:   2+ and symmetric all extremities  Skin:   Warm, dry, Skin color, texture, turgor normal, no obvious rashes or lesions Psych: No HI/SI, judgement and insight good, Euthymic mood. Full Affect.  Neurologic:   CNII-XII intact, normal strength, sensation and reflexes    Throughout

## 2019-03-10 NOTE — Patient Instructions (Addendum)
Preventive Care for Adults, Female  A healthy lifestyle and preventive care can promote health and wellness. Preventive health guidelines for women include the following key practices.   A routine yearly physical is a good way to check with your health care provider about your health and preventive screening. It is a chance to share any concerns and updates on your health and to receive a thorough exam.   Visit your dentist for a routine exam and preventive care every 6 months. Brush your teeth twice a day and floss once a day. Good oral hygiene prevents tooth decay and gum disease.   The frequency of eye exams is based on your age, health, family medical history, use of contact lenses, and other factors. Follow your health care provider's recommendations for frequency of eye exams.   Eat a healthy diet. Foods like vegetables, fruits, whole grains, low-fat dairy products, and lean protein foods contain the nutrients you need without too many calories. Decrease your intake of foods high in solid fats, added sugars, and salt. Eat the right amount of calories for you.Get information about a proper diet from your health care provider, if necessary.   Regular physical exercise is one of the most important things you can do for your health. Most adults should get at least 150 minutes of moderate-intensity exercise (any activity that increases your heart rate and causes you to sweat) each week. In addition, most adults need muscle-strengthening exercises on 2 or more days a week.   Maintain a healthy weight. The body mass index (BMI) is a screening tool to identify possible weight problems. It provides an estimate of body fat based on height and weight. Your health care provider can find your BMI, and can help you achieve or maintain a healthy weight.For adults 20 years and older:   - A BMI below 18.5 is considered underweight.   - A BMI of 18.5 to 24.9 is normal.   - A BMI of 25 to 29.9 is  considered overweight.   - A BMI of 30 and above is considered obese.   Maintain normal blood lipids and cholesterol levels by exercising and minimizing your intake of trans and saturated fats.  Eat a balanced diet with plenty of fruit and vegetables. Blood tests for lipids and cholesterol should begin at age 20 and be repeated every 5 years minimum.  If your lipid or cholesterol levels are high, you are over 40, or you are at high risk for heart disease, you may need your cholesterol levels checked more frequently.Ongoing high lipid and cholesterol levels should be treated with medicines if diet and exercise are not working.   If you smoke, find out from your health care provider how to quit. If you do not use tobacco, do not start.   Lung cancer screening is recommended for adults aged 55-80 years who are at high risk for developing lung cancer because of a history of smoking. A yearly low-dose CT scan of the lungs is recommended for people who have at least a 30-pack-year history of smoking and are a current smoker or have quit within the past 15 years. A pack year of smoking is smoking an average of 1 pack of cigarettes a day for 1 year (for example: 1 pack a day for 30 years or 2 packs a day for 15 years). Yearly screening should continue until the smoker has stopped smoking for at least 15 years. Yearly screening should be stopped for people who develop a   health problem that would prevent them from having lung cancer treatment.   If you are pregnant, do not drink alcohol. If you are breastfeeding, be very cautious about drinking alcohol. If you are not pregnant and choose to drink alcohol, do not have more than 1 drink per day. One drink is considered to be 12 ounces (355 mL) of beer, 5 ounces (148 mL) of wine, or 1.5 ounces (44 mL) of liquor.   Avoid use of street drugs. Do not share needles with anyone. Ask for help if you need support or instructions about stopping the use of  drugs.   High blood pressure causes heart disease and increases the risk of stroke. Your blood pressure should be checked at least yearly.  Ongoing high blood pressure should be treated with medicines if weight loss and exercise do not work.   If you are 55-79 years old, ask your health care provider if you should take aspirin to prevent strokes.   Diabetes screening involves taking a blood sample to check your fasting blood sugar level. This should be done once every 3 years, after age 45, if you are within normal weight and without risk factors for diabetes. Testing should be considered at a younger age or be carried out more frequently if you are overweight and have at least 1 risk factor for diabetes.   Breast cancer screening is essential preventive care for women. You should practice "breast self-awareness."  This means understanding the normal appearance and feel of your breasts and may include breast self-examination.  Any changes detected, no matter how small, should be reported to a health care provider.  Women in their 20s and 30s should have a clinical breast exam (CBE) by a health care provider as part of a regular health exam every 1 to 3 years.  After age 40, women should have a CBE every year.  Starting at age 40, women should consider having a mammogram (breast X-ray test) every year.  Women who have a family history of breast cancer should talk to their health care provider about genetic screening.  Women at a high risk of breast cancer should talk to their health care providers about having an MRI and a mammogram every year.   -Breast cancer gene (BRCA)-related cancer risk assessment is recommended for women who have family members with BRCA-related cancers. BRCA-related cancers include breast, ovarian, tubal, and peritoneal cancers. Having family members with these cancers may be associated with an increased risk for harmful changes (mutations) in the breast cancer genes BRCA1 and  BRCA2. Results of the assessment will determine the need for genetic counseling and BRCA1 and BRCA2 testing.   The Pap test is a screening test for cervical cancer. A Pap test can show cell changes on the cervix that might become cervical cancer if left untreated. A Pap test is a procedure in which cells are obtained and examined from the lower end of the uterus (cervix).   - Women should have a Pap test starting at age 21.   - Between ages 21 and 29, Pap tests should be repeated every 2 years.   - Beginning at age 30, you should have a Pap test every 3 years as long as the past 3 Pap tests have been normal.   - Some women have medical problems that increase the chance of getting cervical cancer. Talk to your health care provider about these problems. It is especially important to talk to your health care provider if a   new problem develops soon after your last Pap test. In these cases, your health care provider may recommend more frequent screening and Pap tests.   - The above recommendations are the same for women who have or have not gotten the vaccine for human papillomavirus (HPV).   - If you had a hysterectomy for a problem that was not cancer or a condition that could lead to cancer, then you no longer need Pap tests. Even if you no longer need a Pap test, a regular exam is a good idea to make sure no other problems are starting.   - If you are between ages 65 and 70 years, and you have had normal Pap tests going back 10 years, you no longer need Pap tests. Even if you no longer need a Pap test, a regular exam is a good idea to make sure no other problems are starting.   - If you have had past treatment for cervical cancer or a condition that could lead to cancer, you need Pap tests and screening for cancer for at least 20 years after your treatment.   - If Pap tests have been discontinued, risk factors (such as a new sexual partner) need to be reassessed to determine if screening should  be resumed.   - The HPV test is an additional test that may be used for cervical cancer screening. The HPV test looks for the virus that can cause the cell changes on the cervix. The cells collected during the Pap test can be tested for HPV. The HPV test could be used to screen women aged 30 years and older, and should be used in women of any age who have unclear Pap test results. After the age of 30, women should have HPV testing at the same frequency as a Pap test.   Colorectal cancer can be detected and often prevented. Most routine colorectal cancer screening begins at the age of 50 years and continues through age 75 years. However, your health care provider may recommend screening at an earlier age if you have risk factors for colon cancer. On a yearly basis, your health care provider may provide home test kits to check for hidden blood in the stool.  Use of a small camera at the end of a tube, to directly examine the colon (sigmoidoscopy or colonoscopy), can detect the earliest forms of colorectal cancer. Talk to your health care provider about this at age 50, when routine screening begins. Direct exam of the colon should be repeated every 5 -10 years through age 75 years, unless early forms of pre-cancerous polyps or small growths are found.   People who are at an increased risk for hepatitis B should be screened for this virus. You are considered at high risk for hepatitis B if:  -You were born in a country where hepatitis B occurs often. Talk with your health care provider about which countries are considered high risk.  - Your parents were born in a high-risk country and you have not received a shot to protect against hepatitis B (hepatitis B vaccine).  - You have HIV or AIDS.  - You use needles to inject street drugs.  - You live with, or have sex with, someone who has Hepatitis B.  - You get hemodialysis treatment.  - You take certain medicines for conditions like cancer, organ  transplantation, and autoimmune conditions.   Hepatitis C blood testing is recommended for all people born from 1945 through 1965 and any individual   with known risks for hepatitis C.   Practice safe sex. Use condoms and avoid high-risk sexual practices to reduce the spread of sexually transmitted infections (STIs). STIs include gonorrhea, chlamydia, syphilis, trichomonas, herpes, HPV, and human immunodeficiency virus (HIV). Herpes, HIV, and HPV are viral illnesses that have no cure. They can result in disability, cancer, and death. Sexually active women aged 25 years and younger should be checked for chlamydia. Older women with new or multiple partners should also be tested for chlamydia. Testing for other STIs is recommended if you are sexually active and at increased risk.   Osteoporosis is a disease in which the bones lose minerals and strength with aging. This can result in serious bone fractures or breaks. The risk of osteoporosis can be identified using a bone density scan. Women ages 65 years and over and women at risk for fractures or osteoporosis should discuss screening with their health care providers. Ask your health care provider whether you should take a calcium supplement or vitamin D to There are also several preventive steps women can take to avoid osteoporosis and resulting fractures or to keep osteoporosis from worsening. -->Recommendations include:  Eat a balanced diet high in fruits, vegetables, calcium, and vitamins.  Get enough calcium. The recommended total intake of is 1,200 mg daily; for best absorption, if taking supplements, divide doses into 250-500 mg doses throughout the day. Of the two types of calcium, calcium carbonate is best absorbed when taken with food but calcium citrate can be taken on an empty stomach.  Get enough vitamin D. NAMS and the National Osteoporosis Foundation recommend at least 1,000 IU per day for women age 50 and over who are at risk of vitamin D  deficiency. Vitamin D deficiency can be caused by inadequate sun exposure (for example, those who live in northern latitudes).  Avoid alcohol and smoking. Heavy alcohol intake (more than 7 drinks per week) increases the risk of falls and hip fracture and women smokers tend to lose bone more rapidly and have lower bone mass than nonsmokers. Stopping smoking is one of the most important changes women can make to improve their health and decrease risk for disease.  Be physically active every day. Weight-bearing exercise (for example, fast walking, hiking, jogging, and weight training) may strengthen bones or slow the rate of bone loss that comes with aging. Balancing and muscle-strengthening exercises can reduce the risk of falling and fracture.  Consider therapeutic medications. Currently, several types of effective drugs are available. Healthcare providers can recommend the type most appropriate for each woman.  Eliminate environmental factors that may contribute to accidents. Falls cause nearly 90% of all osteoporotic fractures, so reducing this risk is an important bone-health strategy. Measures include ample lighting, removing obstructions to walking, using nonskid rugs on floors, and placing mats and/or grab bars in showers.  Be aware of medication side effects. Some common medicines make bones weaker. These include a type of steroid drug called glucocorticoids used for arthritis and asthma, some antiseizure drugs, certain sleeping pills, treatments for endometriosis, and some cancer drugs. An overactive thyroid gland or using too much thyroid hormone for an underactive thyroid can also be a problem. If you are taking these medicines, talk to your doctor about what you can do to help protect your bones.reduce the rate of osteoporosis.    Menopause can be associated with physical symptoms and risks. Hormone replacement therapy is available to decrease symptoms and risks. You should talk to your  health care provider   about whether hormone replacement therapy is right for you.   Use sunscreen. Apply sunscreen liberally and repeatedly throughout the day. You should seek shade when your shadow is shorter than you. Protect yourself by wearing long sleeves, pants, a wide-brimmed hat, and sunglasses year round, whenever you are outdoors.   Once a month, do a whole body skin exam, using a mirror to look at the skin on your back. Tell your health care provider of new moles, moles that have irregular borders, moles that are larger than a pencil eraser, or moles that have changed in shape or color.   -Stay current with required vaccines (immunizations).   Influenza vaccine. All adults should be immunized every year.  Tetanus, diphtheria, and acellular pertussis (Td, Tdap) vaccine. Pregnant women should receive 1 dose of Tdap vaccine during each pregnancy. The dose should be obtained regardless of the length of time since the last dose. Immunization is preferred during the 27th 36th week of gestation. An adult who has not previously received Tdap or who does not know her vaccine status should receive 1 dose of Tdap. This initial dose should be followed by tetanus and diphtheria toxoids (Td) booster doses every 10 years. Adults with an unknown or incomplete history of completing a 3-dose immunization series with Td-containing vaccines should begin or complete a primary immunization series including a Tdap dose. Adults should receive a Td booster every 10 years.  Varicella vaccine. An adult without evidence of immunity to varicella should receive 2 doses or a second dose if she has previously received 1 dose. Pregnant females who do not have evidence of immunity should receive the first dose after pregnancy. This first dose should be obtained before leaving the health care facility. The second dose should be obtained 4 8 weeks after the first dose.  Human papillomavirus (HPV) vaccine. Females aged 13 26  years who have not received the vaccine previously should obtain the 3-dose series. The vaccine is not recommended for use in pregnant females. However, pregnancy testing is not needed before receiving a dose. If a female is found to be pregnant after receiving a dose, no treatment is needed. In that case, the remaining doses should be delayed until after the pregnancy. Immunization is recommended for any person with an immunocompromised condition through the age of 26 years if she did not get any or all doses earlier. During the 3-dose series, the second dose should be obtained 4 8 weeks after the first dose. The third dose should be obtained 24 weeks after the first dose and 16 weeks after the second dose.  Zoster vaccine. One dose is recommended for adults aged 60 years or older unless certain conditions are present.  Measles, mumps, and rubella (MMR) vaccine. Adults born before 1957 generally are considered immune to measles and mumps. Adults born in 1957 or later should have 1 or more doses of MMR vaccine unless there is a contraindication to the vaccine or there is laboratory evidence of immunity to each of the three diseases. A routine second dose of MMR vaccine should be obtained at least 28 days after the first dose for students attending postsecondary schools, health care workers, or international travelers. People who received inactivated measles vaccine or an unknown type of measles vaccine during 1963 1967 should receive 2 doses of MMR vaccine. People who received inactivated mumps vaccine or an unknown type of mumps vaccine before 1979 and are at high risk for mumps infection should consider immunization with 2 doses of   MMR vaccine. For females of childbearing age, rubella immunity should be determined. If there is no evidence of immunity, females who are not pregnant should be vaccinated. If there is no evidence of immunity, females who are pregnant should delay immunization until after pregnancy.  Unvaccinated health care workers born before 1957 who lack laboratory evidence of measles, mumps, or rubella immunity or laboratory confirmation of disease should consider measles and mumps immunization with 2 doses of MMR vaccine or rubella immunization with 1 dose of MMR vaccine.  Pneumococcal 13-valent conjugate (PCV13) vaccine. When indicated, a person who is uncertain of her immunization history and has no record of immunization should receive the PCV13 vaccine. An adult aged 19 years or older who has certain medical conditions and has not been previously immunized should receive 1 dose of PCV13 vaccine. This PCV13 should be followed with a dose of pneumococcal polysaccharide (PPSV23) vaccine. The PPSV23 vaccine dose should be obtained at least 8 weeks after the dose of PCV13 vaccine. An adult aged 19 years or older who has certain medical conditions and previously received 1 or more doses of PPSV23 vaccine should receive 1 dose of PCV13. The PCV13 vaccine dose should be obtained 1 or more years after the last PPSV23 vaccine dose.  Pneumococcal polysaccharide (PPSV23) vaccine. When PCV13 is also indicated, PCV13 should be obtained first. All adults aged 65 years and older should be immunized. An adult younger than age 65 years who has certain medical conditions should be immunized. Any person who resides in a nursing home or long-term care facility should be immunized. An adult smoker should be immunized. People with an immunocompromised condition and certain other conditions should receive both PCV13 and PPSV23 vaccines. People with human immunodeficiency virus (HIV) infection should be immunized as soon as possible after diagnosis. Immunization during chemotherapy or radiation therapy should be avoided. Routine use of PPSV23 vaccine is not recommended for American Indians, [place] Natives, or people younger than 65 years unless there are medical conditions that require PPSV23 vaccine. When indicated,  people who have unknown immunization and have no record of immunization should receive PPSV23 vaccine. One-time revaccination 5 years after the first dose of PPSV23 is recommended for people aged 19 64 years who have chronic kidney failure, nephrotic syndrome, asplenia, or immunocompromised conditions. People who received 1 2 doses of PPSV23 before age 65 years should receive another dose of PPSV23 vaccine at age 65 years or later if at least 5 years have passed since the previous dose. Doses of PPSV23 are not needed for people immunized with PPSV23 at or after age 65 years.  Meningococcal vaccine. Adults with asplenia or persistent complement component deficiencies should receive 2 doses of quadrivalent meningococcal conjugate (MenACWY-D) vaccine. The doses should be obtained at least 2 months apart. Microbiologists working with certain meningococcal bacteria, military recruits, people at risk during an outbreak, and people who travel to or live in countries with a high rate of meningitis should be immunized. A first-year college student up through age 21 years who is living in a residence hall should receive a dose if she did not receive a dose on or after her 16th birthday. Adults who have certain high-risk conditions should receive one or more doses of vaccine.  Hepatitis A vaccine. Adults who wish to be protected from this disease, have certain high-risk conditions, work with hepatitis A-infected animals, work in hepatitis A research labs, or travel to or work in countries with a high rate of hepatitis A should be   immunized. Adults who were previously unvaccinated and who anticipate close contact with an international adoptee during the first 60 days after arrival in the [place] from a country with a high rate of hepatitis A should be immunized.  Hepatitis B vaccine.  Adults who wish to be protected from this disease, have certain high-risk conditions, may be exposed to blood or other infectious  body fluids, are household contacts or sex partners of hepatitis B positive people, are clients or workers in certain care facilities, or travel to or work in countries with a high rate of hepatitis B should be immunized.  Haemophilus influenzae type b (Hib) vaccine. A previously unvaccinated person with asplenia or sickle cell disease or having a scheduled splenectomy should receive 1 dose of Hib vaccine. Regardless of previous immunization, a recipient of a hematopoietic stem cell transplant should receive a 3-dose series 6 12 months after her successful transplant. Hib vaccine is not recommended for adults with HIV infection.  Preventive Services / Frequency Ages 19 to 39years  Blood pressure check.** / Every 1 to 2 years.  Lipid and cholesterol check.** / Every 5 years beginning at age 20.  Clinical breast exam.** / Every 3 years for women in their 20s and 30s.  BRCA-related cancer risk assessment.** / For women who have family members with a BRCA-related cancer (breast, ovarian, tubal, or peritoneal cancers).  Pap test.** / Every 2 years from ages 21 through 29. Every 3 years starting at age 30 through age 65 or 70 with a history of 3 consecutive normal Pap tests.  HPV screening.** / Every 3 years from ages 30 through ages 65 to 70 with a history of 3 consecutive normal Pap tests.  Hepatitis C blood test.** / For any individual with known risks for hepatitis C.  Skin self-exam. / Monthly.  Influenza vaccine. / Every year.  Tetanus, diphtheria, and acellular pertussis (Tdap, Td) vaccine.** / Consult your health care provider. Pregnant women should receive 1 dose of Tdap vaccine during each pregnancy. 1 dose of Td every 10 years.  Varicella vaccine.** / Consult your health care provider. Pregnant females who do not have evidence of immunity should receive the first dose after pregnancy.  HPV vaccine. / 3 doses over 6 months, if 26 and younger. The vaccine is not recommended for use in  pregnant females. However, pregnancy testing is not needed before receiving a dose.  Measles, mumps, rubella (MMR) vaccine.** / You need at least 1 dose of MMR if you were born in 1957 or later. You may also need a 2nd dose. For females of childbearing age, rubella immunity should be determined. If there is no evidence of immunity, females who are not pregnant should be vaccinated. If there is no evidence of immunity, females who are pregnant should delay immunization until after pregnancy.  Pneumococcal 13-valent conjugate (PCV13) vaccine.** / Consult your health care provider.  Pneumococcal polysaccharide (PPSV23) vaccine.** / 1 to 2 doses if you smoke cigarettes or if you have certain conditions.  Meningococcal vaccine.** / 1 dose if you are age 19 to 19 years and a first-year college student living in a residence hall, or have one of several medical conditions, you need to get vaccinated against meningococcal disease. You may also need additional booster doses.  Hepatitis A vaccine.** / Consult your health care provider.  Hepatitis B vaccine.** / Consult your health care provider.  Haemophilus influenzae type b (Hib) vaccine.** / Consult your health care provider.  Ages 40 to 64years    Blood pressure check.** / Every 1 to 2 years.  Lipid and cholesterol check.** / Every 5 years beginning at age 20 years.  Lung cancer screening. / Every year if you are aged 55 80 years and have a 30-pack-year history of smoking and currently smoke or have quit within the past 15 years. Yearly screening is stopped once you have quit smoking for at least 15 years or develop a health problem that would prevent you from having lung cancer treatment.  Clinical breast exam.** / Every year after age 40 years.  BRCA-related cancer risk assessment.** / For women who have family members with a BRCA-related cancer (breast, ovarian, tubal, or peritoneal cancers).  Mammogram.** / Every year beginning at age 40  years and continuing for as long as you are in good health. Consult with your health care provider.  Pap test.** / Every 3 years starting at age 30 years through age 65 or 70 years with a history of 3 consecutive normal Pap tests.  HPV screening.** / Every 3 years from ages 30 years through ages 65 to 70 years with a history of 3 consecutive normal Pap tests.  Fecal occult blood test (FOBT) of stool. / Every year beginning at age 50 years and continuing until age 75 years. You may not need to do this test if you get a colonoscopy every 10 years.  Flexible sigmoidoscopy or colonoscopy.** / Every 5 years for a flexible sigmoidoscopy or every 10 years for a colonoscopy beginning at age 50 years and continuing until age 75 years.  Hepatitis C blood test.** / For all people born from 1945 through 1965 and any individual with known risks for hepatitis C.  Skin self-exam. / Monthly.  Influenza vaccine. / Every year.  Tetanus, diphtheria, and acellular pertussis (Tdap/Td) vaccine.** / Consult your health care provider. Pregnant women should receive 1 dose of Tdap vaccine during each pregnancy. 1 dose of Td every 10 years.  Varicella vaccine.** / Consult your health care provider. Pregnant females who do not have evidence of immunity should receive the first dose after pregnancy.  Zoster vaccine.** / 1 dose for adults aged 60 years or older.  Measles, mumps, rubella (MMR) vaccine.** / You need at least 1 dose of MMR if you were born in 1957 or later. You may also need a 2nd dose. For females of childbearing age, rubella immunity should be determined. If there is no evidence of immunity, females who are not pregnant should be vaccinated. If there is no evidence of immunity, females who are pregnant should delay immunization until after pregnancy.  Pneumococcal 13-valent conjugate (PCV13) vaccine.** / Consult your health care provider.  Pneumococcal polysaccharide (PPSV23) vaccine.** / 1 to 2 doses if  you smoke cigarettes or if you have certain conditions.  Meningococcal vaccine.** / Consult your health care provider.  Hepatitis A vaccine.** / Consult your health care provider.  Hepatitis B vaccine.** / Consult your health care provider.  Haemophilus influenzae type b (Hib) vaccine.** / Consult your health care provider.  Ages 65 years and over  Blood pressure check.** / Every 1 to 2 years.  Lipid and cholesterol check.** / Every 5 years beginning at age 20 years.  Lung cancer screening. / Every year if you are aged 55 80 years and have a 30-pack-year history of smoking and currently smoke or have quit within the past 15 years. Yearly screening is stopped once you have quit smoking for at least 15 years or develop a health problem that   would prevent you from having lung cancer treatment.  Clinical breast exam.** / Every year after age 40 years.  BRCA-related cancer risk assessment.** / For women who have family members with a BRCA-related cancer (breast, ovarian, tubal, or peritoneal cancers).  Mammogram.** / Every year beginning at age 40 years and continuing for as long as you are in good health. Consult with your health care provider.  Pap test.** / Every 3 years starting at age 30 years through age 65 or 70 years with 3 consecutive normal Pap tests. Testing can be stopped between 65 and 70 years with 3 consecutive normal Pap tests and no abnormal Pap or HPV tests in the past 10 years.  HPV screening.** / Every 3 years from ages 30 years through ages 65 or 70 years with a history of 3 consecutive normal Pap tests. Testing can be stopped between 65 and 70 years with 3 consecutive normal Pap tests and no abnormal Pap or HPV tests in the past 10 years.  Fecal occult blood test (FOBT) of stool. / Every year beginning at age 50 years and continuing until age 75 years. You may not need to do this test if you get a colonoscopy every 10 years.  Flexible sigmoidoscopy or colonoscopy.** /  Every 5 years for a flexible sigmoidoscopy or every 10 years for a colonoscopy beginning at age 50 years and continuing until age 75 years.  Hepatitis C blood test.** / For all people born from 1945 through 1965 and any individual with known risks for hepatitis C.  Osteoporosis screening.** / A one-time screening for women ages 65 years and over and women at risk for fractures or osteoporosis.  Skin self-exam. / Monthly.  Influenza vaccine. / Every year.  Tetanus, diphtheria, and acellular pertussis (Tdap/Td) vaccine.** / 1 dose of Td every 10 years.  Varicella vaccine.** / Consult your health care provider.  Zoster vaccine.** / 1 dose for adults aged 60 years or older.  Pneumococcal 13-valent conjugate (PCV13) vaccine.** / Consult your health care provider.  Pneumococcal polysaccharide (PPSV23) vaccine.** / 1 dose for all adults aged 65 years and older.  Meningococcal vaccine.** / Consult your health care provider.  Hepatitis A vaccine.** / Consult your health care provider.  Hepatitis B vaccine.** / Consult your health care provider.  Haemophilus influenzae type b (Hib) vaccine.** / Consult your health care provider. ** Family history and personal history of risk and conditions may change your health care provider's recommendations. Document Released: 06/05/2001 Document Revised: 01/28/2013  [employer] Patient Information 2014 [employer]   EXERCISE AND DIET:  We recommended that you start or continue a regular exercise program for good health. Regular exercise means any activity that makes your heart beat faster and makes you sweat.  We recommend exercising at least 30 minutes per day at least 3 days a week, preferably 5.  We also recommend a diet low in fat and sugar / carbohydrates.  Inactivity, poor dietary choices and obesity can cause diabetes, heart attack, stroke, and kidney damage, among others.     ALCOHOL AND SMOKING:  Women should limit their alcohol intake to no  more than 7 drinks/beers/glasses of wine (combined, not each!) per week. Moderation of alcohol intake to this level decreases your risk of breast cancer and liver damage.  ( And of course, no recreational drugs are part of a healthy lifestyle.)  Also, you should not be smoking at all or even being exposed to second hand smoke. Most people know smoking can   smoking can cause cancer, and various heart and lung diseases, but did you know it also contributes to weakening of your bones?  Aging of your skin?  Yellowing of your teeth and nails?   CALCIUM AND VITAMIN D:  Adequate intake of calcium and Vitamin D are recommended.  The recommendations for exact amounts of these supplements seem to change often, but generally speaking 600 mg of calcium (either carbonate or citrate) and 800 units of Vitamin D per day seems prudent. Certain women may benefit from higher intake of Vitamin D.  If you are among these women, your doctor will have told you during your visit.     PAP SMEARS:  Pap smears, to check for cervical cancer or precancers,  have traditionally been done yearly, although recent scientific advances have shown that most women can have pap smears less often.  However, every woman still should have a physical exam from her gynecologist or primary care physician every year. It will include a breast check, inspection of the vulva and vagina to check for abnormal growths or skin changes, a visual exam of the cervix, and then an exam to evaluate the size and shape of the uterus and ovaries.  And after 55 years of age, a rectal exam is indicated to check for rectal cancers. We will also provide age appropriate advice regarding health maintenance, like when you should have certain vaccines, screening for sexually transmitted diseases, bone density testing, colonoscopy, mammograms, etc.    MAMMOGRAMS:  All women over 45 years old should have a yearly mammogram. Many facilities now offer a "3D" mammogram, which may cost  around $50 extra out of pocket. If possible,  we recommend you accept the option to have the 3D mammogram performed.  It both reduces the number of women who will be called back for extra views which then turn out to be normal, and it is better than the routine mammogram at detecting truly abnormal areas.     COLONOSCOPY:  Colonoscopy to screen for colon cancer is recommended for all women at age 37.  We know, you hate the idea of the prep.  We agree, BUT, having colon cancer and not knowing it is worse!!  Colon cancer so often starts as a polyp that can be seen and removed at colonscopy, which can quite literally save your life!  And if your first colonoscopy is normal and you have no family history of colon cancer, most women don't have to have it again for 10 years.  Once every ten years, you can do something that may end up saving your life, right?  We will be happy to help you get it scheduled when you are ready.  Be sure to check your insurance coverage so you understand how much it will cost.  It may be covered as a preventative service at no cost, but you should check your particular policy.      Factors that increase risk for colorectal cancer:  - Family history of colorectal cancer - Having colorectal cancer in a family member increases your risk of cancer if the family member is a first-degree relative (a parent, brother or sister, or child), if several family members are affected, or if the cancers occurred at an early age (eg, before age 70 years).  - Prior colorectal cancer or polyps - People who have previously had colorectal cancer have an increased risk of developing a new colorectal cancer. People who have had adenomatous polyps before the age of  82 years are also at increased risk for developing colorectal cancer.   - Increasing age - Although the average person has a 4.5 percent lifetime risk of developing colorectal cancer, 90 percent of these cancers occur in people older than 55  years of age. Risk increases with age throughout life.  - Lifestyle factors - Several lifestyle factors increase the risk of colorectal cancer, including:  A diet high in fat and red or processed meat and low in fiber  A sedentary lifestyle  Cigarette smoking  Alcohol use  Obesity   Large increase in risk -- Some conditions greatly increase the risk of colorectal cancer. Such as: Familial adenomatous polyposis -- Familial adenomatous polyposis (FAP) is an uncommon inherited condition. Nearly 100 percent of people with this condition will develop colorectal cancer during their lifetime, and most of these cancers occur before the age of 89 years. FAP causes hundreds of polyps to develop throughout the colon beginning in adolescence.   Hereditary nonpolyposis colon cancer -- Hereditary nonpolyposis colon cancer (HNPCC, also called Lynch syndrome) is another inherited condition associated with an increased risk of colorectal cancer. It is slightly more common than FAP, but is still uncommon, accounting for less than 1 in 20 cases of colorectal cancer.  About 29 percent of people with HNPCC will experience colorectal cancer by the age of 39. Cancer also tends to occur at younger ages. People with HNPCC are also at risk for other types of cancer, including cancer of the uterus, stomach, bladder, kidney, and ovary.   There are other rarer inherited conditions that increase risk of colorectal cancer, including MUTYH-associated polyposis, hamartomatous polyposis, Peutz-Jeghers syndrome, and juvenile polyposis syndrome.  Inflammatory bowel disease -- People with Crohn disease of the colon or ulcerative colitis have an increased risk of colorectal cancer. The amount of increased risk depends upon the amount of inflamed colon and the duration of disease; pancolitis (inflammation of the entire colon) and colitis of 10 years' duration or longer are associated with the greatest risk for colorectal cancer. The  risk of colon cancer is not increased in people with irritable bowel disease.  Factors that may decrease risk -- Aspirin, ibuprofen, and related nonsteroidal antiinflammatory medications may decrease the risk of developing colorectal cancer.     COLON CANCER SCREENING TESTS  Several tests available for colorectal cancer screening can detect precancerous polyps (adenomas) and can lead to cancer prevention and/or detect cancers at an early, more treatable stage.  Guidelines from expert groups recommend that you and your health care provider discuss the available options and choose a testing strategy that makes sense for you. Some experts believe that tests that detect precancerous polyps are preferable; these include colonoscopy, computed tomography colonography (CTC), flexible sigmoidoscopy, and the newer stool DNA test [1]. Other experts believe that being screened with any test, including stool tests that detect blood, is more important than which test is used.  Colonoscopy -- Colonoscopy allows a clinician to see the lining of the rectum and the entire colon   Procedure - Colonoscopy requires that you prepare by cleaning out your entire colon and rectum. This usually involves consuming a liquid medication that causes temporary diarrhea. You are usually given a mild sedative drug before the procedure. During colonoscopy, a thin, lighted tube is used to directly view the lining of the rectum and the entire colon. Biopsies (small samples of tissue) can be taken during the procedure. Polyps and some cancers can be removed during this procedure.  Effectiveness -  Colonoscopy detects most small polyps and almost all large polyps and cancers and substantially reduces the risk of developing and dying from colorectal cancer [2].  Risks and disadvantages - The risks of colonoscopy, while small, are greater than those of other screening tests. Colonoscopy may lead to serious bleeding or a tear of the intestinal  wall in some individuals (about 1 in 1,000). Because the procedure usually requires sedation, you must be accompanied home after the procedure and you should not return to work or other activities on the same day.   Sigmoidoscopy -- Sigmoidoscopy allows a clinician to directly view the lining of the rectum and the lower part of the colon (the descending colon). This area accounts for about one-half of the total area of the rectum and colon.   Procedure - Sigmoidoscopy requires that you prepare by cleaning out the lower bowel. This usually involves consuming a clear liquid diet and using an enema shortly before the examination. Most people do not need sedative drugs and are able to return to work or other activities the same day. During the procedure, a thin, lighted tube is advanced into the rectum and through the left side of the colon to check for polyps and cancer; the procedure may cause mild cramping. Biopsies (small samples of tissue) can be taken during sigmoidoscopy. Sigmoidoscopy may be performed in a doctor's office.  Effectiveness - Sigmoidoscopy can identify polyps and cancers in the descending colon and rectum with a high degree of accuracy. Studies have shown that sigmoidoscopy reduces the incidence of colorectal cancer and overall mortality.   Risks and disadvantages - The risks of sigmoidoscopy are small. The procedure could create a small tear in the intestinal wall in about 2 per every 10,000 people; death from this complication is rare. A major disadvantage of sigmoidoscopy is that it cannot detect polyps or cancers that are located in the right side (eg, the cecum, ascending colon hepatic flexure, or some of the transverse colon), which are more common in older women.  Additional testing - Having polyps or cancers in the lower colon increases the likelihood that there are polyps or cancer in the remaining part of the colon. Thus, if sigmoidoscopy reveals polyps or cancer, colonoscopy is  recommended to view the entire length of the colon.   CT colonography ("virtual colonoscopy") -- Computed tomography colonography (CTC, sometimes called "virtual colonoscopy") is a test that uses a CT scanner to take images of the entire bowel. These images are in two- and three-dimensions and are reconstructed to allow a radiologist to determine if polyps or cancers are present (picture   1). The major advantages of CTC are that it does not require sedation, it is noninvasive, the entire bowel can be examined, and abnormal areas (adenomas) can be detected about as well as with traditional (optical) colonoscopy.  2) There are several disadvantages of CTC:   - Like traditional colonoscopy, CTC usually it requires a bowel prep to clean out the colon.   - If an abnormal area is found with CTC, a traditional colonoscopy will be needed to see the area and take a tissue sample (biopsy).  - CTC may detect abnormalities other than polyps or cancer in the colon/rectum. Many of these incidental findings will require further testing that could lead to unnecessary harm.  --->  CTC is usually not covered by health insurance plans in the [place].  - CTC, like many other imaging tests, exposes patients to radiation which may have long-term risks.  Stool tests -- Colorectal cancers often release microscopic amounts of blood and abnormal DNA into the stool. Stool tests can detect blood or abnormal DNA markers.   Two types of tests, called guaiac tests (Hemoccult SENSA) and immunochemical tests, evaluate the stool for blood, which may be present if there is bleeding from a colon cancer (or other source of blood).   With guaiac testing, you collect two samples of stool from three consecutive bowel movements, which you apply to home collection cards. You mail the cards back to the health care provider. You should avoid drugs that irritate the stomach, such as aspirin and nonsteroidal antiinflammatory drugs  (NSAIDs), before collecting the stool.  With immunochemical testing, you use a long-handled tool to collect the specimen according to the manufacturer's instructions. You apply the brush to a card, and then mail the card to a laboratory. You do not have to change your diet or stop any medications with this test. Immunochemical testing is more convenient and somewhat better able to find cancer than guaiac testing, but the test kit is a bit more expensive.   Stool testing for blood, when performed once per year, reduces the risk of dying from colorectal cancer by one-third or more [4]. However, because polyps seldom bleed, stool testing for blood is less likely to detect polyps than other screening tests. In addition, only 2 to 5 percent of people with a positive stool test actually have colorectal cancer. If the stool test is positive, your entire colon should be examined with colonoscopy.   A DNA test ( Cologuard test)  is yet another option and is done every three years. This test looks for specific DNA markers that may signify the presence of a colon cancer, and it also looks for blood in the stool. An entire bowel movement needs to be collected and shipped to the laboratory for testing. An abnormal test should be followed up by colonoscopy.  Fecal occult blood test and sigmoidoscopy -- Combined screening with a fecal immunochemical test and sigmoidoscopy is a possible screening strategy and may be more effective than either test done alone. The sensitive fecal occult blood test can be used in place of the immunochemical test if necessary.    COLON CANCER SCREENING PLANS  The colon cancer screening plan that is right for you depends upon your risk of colorectal cancer.  Average risk of colorectal cancer -- People with an average risk of colorectal cancer should begin screening at age 56. Any one of the following screening strategies is recommended:  - Colonoscopy every 10 years - Computed  tomographic colonography (CTC) every five years - Flexible sigmoidoscopy every five years, with or without an immunochemical stool test - Stool testing every year (for guaiac and immunochemical occult blood tests) - Stool testing every three years using a DNA assay and a collection of a full bowel movement  - For those at increased risk of colorectal cancer -- Screening plans for people with an increased risk may entail screening at a younger age, more frequent screening, and/or the use of more sensitive screening tests (usually colonoscopy). The optimal screening plan depends upon the reason for increased risk.   Family history of colorectal cancer  - People who have one first-degree relative (parent, brother, sister, or child) with colorectal cancer or adenomatous polyps at a young age (before the age of 20 years), or two first-degree relatives diagnosed at any age, should begin screening for colon cancer earlier, typically at age 72, or 98  years younger than the earliest diagnosis in their family, whichever comes first.  Screening usually involves colonoscopy every five years if the test results are normal.  - People who have one first-degree relative (parent, brother, sister, or child) who has experienced colorectal cancer or adenomatous polyps at age 22 or later, or two or more second-degree relatives (grandparent, aunt, uncle) with colorectal cancer should begin screening by colonoscopy earlier at age 89, but screening should be repeated as for average-risk people.  - People with a second-degree relative (grandparent, aunt, or uncle) or third-degree relative (great-grandparent or cousin) with colorectal cancer are considered to have an average risk of colorectal cancer (see 'Average risk of colorectal cancer' above).  -Some people have known genetically-based colon cancer syndromes in their family, such as familial adenomatous polyposis (FAP) or hereditary nonpolyposis colon cancer (HNPCC).  These less common conditions require aggressive screening and preventive treatments, and individuals with these conditions in their family should be managed by a clinician with clinical expertise in these syndromes.  - Inflammatory bowel disease -- People with Ulcerative colitis or Crohn disease have an increased risk of colon cancer. The best screening plan depends upon how much of the colon is affected and how long you have had the disease.  This will be determined by your gastroenterologist.       https://www.cdc.gov/vaccines/hcp/vis/vis-statements/tdap.pdf">  Tdap Vaccine (Tetanus, Diphtheria and Pertussis): What You Need to Know 1. Why get vaccinated? Tetanus, diphtheria and pertussis are very serious diseases. Tdap vaccine can protect [place] from these diseases. And, Tdap vaccine given to pregnant women can protect newborn babies against pertussis.Marland Kitchen TETANUS (Lockjaw) is rare in the [place] today. It causes painful muscle tightening and stiffness, usually all over the body.  It can lead to tightening of muscles in the head and neck so you can't open your mouth, swallow, or sometimes even breathe. Tetanus kills about 1 out of 10 people who are infected even after receiving the best medical care. DIPHTHERIA is also rare in the [place] today. It can cause a thick coating to form in the back of the throat.  It can lead to breathing problems, heart failure, paralysis, and death. PERTUSSIS (Whooping Cough) causes severe coughing spells, which can cause difficulty breathing, vomiting and disturbed sleep.  It can also lead to weight loss, incontinence, and rib fractures. Up to 2 in 100 adolescents and 5 in 100 adults with pertussis are hospitalized or have complications, which could include pneumonia or death. These diseases are caused by bacteria. Diphtheria and pertussis are spread from person to person through secretions from coughing or sneezing. Tetanus enters the body through cuts,  scratches, or wounds. Before vaccines, as many as 200,000 cases of diphtheria, 200,000 cases of pertussis, and hundreds of cases of tetanus, were reported in the [place] each year. Since vaccination began, reports of cases for tetanus and diphtheria have dropped by about 99% and for pertussis by about 80%. 2. Tdap vaccine Tdap vaccine can protect adolescents and adults from tetanus, diphtheria, and pertussis. One dose of Tdap is routinely given at age 34 or 65. People who did not get Tdap at that age should get it as soon as possible. Tdap is especially important for healthcare professionals and anyone having close contact with a baby younger than 12 months. Pregnant women should get a dose of Tdap during every pregnancy, to protect the newborn from pertussis. Infants are most at risk for severe, life-threatening complications from pertussis. Another vaccine, called Td, protects against  tetanus and diphtheria, but not pertussis. A Td booster should be given every 10 years. Tdap may be given as one of these boosters if you have never gotten Tdap before. Tdap may also be given after a severe cut or burn to prevent tetanus infection. Your doctor or the person giving you the vaccine can give you more information. Tdap may safely be given at the same time as other vaccines. 3. Some people should not get this vaccine  A person who has ever had a life-threatening allergic reaction after a previous dose of any diphtheria, tetanus or pertussis containing vaccine, OR has a severe allergy to any part of this vaccine, should not get Tdap vaccine. Tell the person giving the vaccine about any severe allergies.  Anyone who had coma or long repeated seizures within 7 days after a childhood dose of DTP or DTaP, or a previous dose of Tdap, should not get Tdap, unless a cause other than the vaccine was found. They can still get Td.  Talk to your doctor if you: ? have seizures or another nervous system  problem, ? had severe pain or swelling after any vaccine containing diphtheria, tetanus or pertussis, ? ever had a condition called Guillain-Barr Syndrome (GBS), ? aren't feeling well on the day the shot is scheduled. 4. Risks With any medicine, including vaccines, there is a chance of side effects. These are usually mild and go away on their own. Serious reactions are also possible but are rare. Most people who get Tdap vaccine do not have any problems with it. Mild problems following Tdap (Did not interfere with activities)  Pain where the shot was given (about 3 in 4 adolescents or 2 in 3 adults)  Redness or swelling where the shot was given (about 1 person in 5)  Mild fever of at least 100.42F (up to about 1 in 25 adolescents or 1 in 100 adults)  Headache (about 3 or 4 people in 10)  Tiredness (about 1 person in 3 or 4)  Nausea, vomiting, diarrhea, stomach ache (up to 1 in 4 adolescents or 1 in 10 adults)  Chills, sore joints (about 1 person in 10)  Body aches (about 1 person in 3 or 4)  Rash, swollen glands (uncommon) Moderate problems following Tdap (Interfered with activities, but did not require medical attention)  Pain where the shot was given (up to 1 in 5 or 6)  Redness or swelling where the shot was given (up to about 1 in 16 adolescents or 1 in 12 adults)  Fever over 102F (about 1 in 100 adolescents or 1 in 250 adults)  Headache (about 1 in 7 adolescents or 1 in 10 adults)  Nausea, vomiting, diarrhea, stomach ache (up to 1 or 3 people in 100)  Swelling of the entire arm where the shot was given (up to about 1 in 500). Severe problems following Tdap (Unable to perform usual activities; required medical attention)  Swelling, severe pain, bleeding and redness in the arm where the shot was given (rare). Problems that could happen after any vaccine:  People sometimes faint after a medical procedure, including vaccination. Sitting or lying down for about 15  minutes can help prevent fainting, and injuries caused by a fall. Tell your doctor if you feel dizzy, or have vision changes or ringing in the ears.  Some people get severe pain in the shoulder and have difficulty moving the arm where a shot was given. This happens very rarely.  Any medication can cause  a severe allergic reaction. Such reactions from a vaccine are very rare, estimated at fewer than 1 in a million doses, and would happen within a few minutes to a few hours after the vaccination. As with any medicine, there is a very remote chance of a vaccine causing a serious injury or death. The safety of vaccines is always being monitored. For more information, visit: http://www.aguilar.org/ 5. What if there is a serious problem? What should I look for?  Look for anything that concerns you, such as signs of a severe allergic reaction, very high fever, or unusual behavior. Signs of a severe allergic reaction can include hives, swelling of the face and throat, difficulty breathing, a fast heartbeat, dizziness, and weakness. These would usually start a few minutes to a few hours after the vaccination. What should I do?  If you think it is a severe allergic reaction or other emergency that can't wait, call 9-1-1 or get the person to the nearest hospital. Otherwise, call your doctor.  Afterward, the reaction should be reported to the Vaccine Adverse Event Reporting System (VAERS). Your doctor might file this report, or you can do it yourself through the VAERS web site at www.vaers.SamedayNews.es, or by calling 657-790-2205. VAERS does not give medical advice. 6. The National Vaccine Injury Compensation Program The [employer] Vaccine Injury Compensation Program (VICP) is a federal program that was created to compensate people who may have been injured by certain vaccines. Persons who believe they may have been injured by a vaccine can learn about the program and about filing a claim by calling  8656460682 or visiting the [place] website at GoldCloset.com.ee. There is a time limit to file a claim for compensation. 7. How can I learn more?  Ask your doctor. He or she can give you the vaccine package insert or suggest other sources of information.  Call your local or state health department.  Contact the Centers for Disease Control and Prevention (CDC): ? Call (585)738-1454 (1-800-CDC-INFO) or ? Visit CDC's website at http://hunter.com/ Vaccine Information Statement Tdap Vaccine (06/16/2013) This information is not intended to replace advice given to you by your health care provider. Make sure you discuss any questions you have with your health care provider. Document Released: 10/09/2011 Document Revised: 11/25/2017 Document Reviewed: 11/25/2017 Elsevier Interactive Patient Education  [employer].

## 2019-03-11 LAB — CBC WITH DIFFERENTIAL/PLATELET
Basophils Absolute: 0 10*3/uL (ref 0.0–0.2)
Basos: 1 %
EOS (ABSOLUTE): 0.1 10*3/uL (ref 0.0–0.4)
Eos: 1 %
Hematocrit: 40.1 % (ref 34.0–46.6)
Hemoglobin: 13.8 g/dL (ref 11.1–15.9)
Immature Grans (Abs): 0 10*3/uL (ref 0.0–0.1)
Immature Granulocytes: 0 %
Lymphocytes Absolute: 2.6 10*3/uL (ref 0.7–3.1)
Lymphs: 45 %
MCH: 32.5 pg (ref 26.6–33.0)
MCHC: 34.4 g/dL (ref 31.5–35.7)
MCV: 94 fL (ref 79–97)
Monocytes Absolute: 0.4 10*3/uL (ref 0.1–0.9)
Monocytes: 6 %
Neutrophils Absolute: 2.7 10*3/uL (ref 1.4–7.0)
Neutrophils: 47 %
Platelets: 252 10*3/uL (ref 150–450)
RBC: 4.25 x10E6/uL (ref 3.77–5.28)
RDW: 12.2 % (ref 11.7–15.4)
WBC: 5.8 10*3/uL (ref 3.4–10.8)

## 2019-03-11 LAB — HEMOGLOBIN A1C
Est. average glucose Bld gHb Est-mCnc: 105 mg/dL
Hgb A1c MFr Bld: 5.3 % (ref 4.8–5.6)

## 2019-03-11 LAB — COMPREHENSIVE METABOLIC PANEL
ALT: 9 IU/L (ref 0–32)
AST: 16 IU/L (ref 0–40)
Albumin/Globulin Ratio: 1.7 (ref 1.2–2.2)
Albumin: 4.3 g/dL (ref 3.8–4.9)
Alkaline Phosphatase: 72 IU/L (ref 39–117)
BUN/Creatinine Ratio: 14 (ref 9–23)
BUN: 8 mg/dL (ref 6–24)
Bilirubin Total: 0.4 mg/dL (ref 0.0–1.2)
CO2: 26 mmol/L (ref 20–29)
Calcium: 9.5 mg/dL (ref 8.7–10.2)
Chloride: 106 mmol/L (ref 96–106)
Creatinine, Ser: 0.59 mg/dL (ref 0.57–1.00)
GFR calc Af Amer: 119 mL/min/{1.73_m2} (ref >59)
GFR calc non Af Amer: 104 mL/min/{1.73_m2} (ref >59)
Globulin, Total: 2.6 g/dL (ref 1.5–4.5)
Glucose: 88 mg/dL (ref 65–99)
Potassium: 4.5 mmol/L (ref 3.5–5.2)
Sodium: 142 mmol/L (ref 134–144)
Total Protein: 6.9 g/dL (ref 6.0–8.5)

## 2019-03-11 LAB — LIPID PANEL
Chol/HDL Ratio: 4.5 ratio — ABNORMAL HIGH (ref 0.0–4.4)
Cholesterol, Total: 214 mg/dL — ABNORMAL HIGH (ref 100–199)
HDL: 48 mg/dL (ref >39)
LDL Chol Calc (NIH): 136 mg/dL — ABNORMAL HIGH (ref 0–99)
Triglycerides: 166 mg/dL — ABNORMAL HIGH (ref 0–149)
VLDL Cholesterol Cal: 30 mg/dL (ref 5–40)

## 2019-03-11 LAB — T4, FREE: Free T4: 1.3 ng/dL (ref 0.82–1.77)

## 2019-03-11 LAB — TSH: TSH: 1.42 u[IU]/mL (ref 0.450–4.500)

## 2019-03-11 LAB — VITAMIN D 25 HYDROXY (VIT D DEFICIENCY, FRACTURES): Vit D, 25-Hydroxy: 25.2 ng/mL — ABNORMAL LOW (ref 30.0–100.0)

## 2019-03-11 LAB — T3: T3, Total: 124 ng/dL (ref 71–180)

## 2019-03-18 ENCOUNTER — Telehealth: Payer: Self-pay | Admitting: Family Medicine

## 2019-03-18 NOTE — Telephone Encounter (Signed)
Patient called states she went to her Dermatologist appt & was ask if several labs were drawn @ her 11/17 appt   (TSH),  (Vit D) advised these were on 11/17 lab report (did " NOT " disclose status to patient) & Ferritin (sp) did NOT see this particular lab.   --Forwarding message to med asst to review w/ provider & advise patient if lab test necessary.  -glh

## 2019-03-18 NOTE — Telephone Encounter (Signed)
LVM for pt to call to discuss.  T. Nelson, CMA  

## 2019-03-24 NOTE — Telephone Encounter (Signed)
Attempted to call pt.  However, there was no answer.  Charyl Bigger, CMA

## 2019-03-29 LAB — SPECIMEN STATUS REPORT

## 2019-03-29 LAB — HEPATITIS C ANTIBODY: Hep C Virus Ab: 0.1 s/co ratio (ref 0.0–0.9)

## 2019-04-02 ENCOUNTER — Other Ambulatory Visit: Payer: 59

## 2019-04-02 ENCOUNTER — Other Ambulatory Visit: Payer: Self-pay

## 2019-04-02 DIAGNOSIS — L658 Other specified nonscarring hair loss: Secondary | ICD-10-CM

## 2019-04-02 NOTE — Progress Notes (Signed)
Pt brought in order from Dr. Allyn Kenner for ferritin level with DX L65.8.  Unable to order labs under Dr. Juel Burrow name d/t he is not part of [facility].  Advised Athena Summerlin to ensure that lab results are routed to Dr. Allyn Kenner.  Charyl Bigger, CMA

## 2019-04-03 LAB — FERRITIN: Ferritin: 65 ng/mL (ref 15–150)

## 2019-04-14 ENCOUNTER — Other Ambulatory Visit: Payer: Self-pay

## 2019-04-14 ENCOUNTER — Encounter: Payer: Self-pay | Admitting: Family Medicine

## 2019-04-14 ENCOUNTER — Ambulatory Visit (INDEPENDENT_AMBULATORY_CARE_PROVIDER_SITE_OTHER): Payer: 59 | Admitting: Family Medicine

## 2019-04-14 VITALS — Ht 67.0 in | Wt 144.0 lb

## 2019-04-14 DIAGNOSIS — E782 Mixed hyperlipidemia: Secondary | ICD-10-CM

## 2019-04-14 DIAGNOSIS — F17200 Nicotine dependence, unspecified, uncomplicated: Secondary | ICD-10-CM | POA: Diagnosis not present

## 2019-04-14 DIAGNOSIS — E559 Vitamin D deficiency, unspecified: Secondary | ICD-10-CM

## 2019-04-14 DIAGNOSIS — Z789 Other specified health status: Secondary | ICD-10-CM | POA: Diagnosis not present

## 2019-04-14 DIAGNOSIS — E781 Pure hyperglyceridemia: Secondary | ICD-10-CM

## 2019-04-14 DIAGNOSIS — Z83438 Family history of other disorder of lipoprotein metabolism and other lipidemia: Secondary | ICD-10-CM

## 2019-04-14 MED ORDER — VITAMIN D (ERGOCALCIFEROL) 1.25 MG (50000 UNIT) PO CAPS: ORAL_CAPSULE | ORAL | 3 refills | Status: DC

## 2019-04-14 NOTE — Progress Notes (Signed)
Virtual / live video office visit note for [employer], D.O- Primary Care Physician at [facility]   I connected with current patient today and beyond visually recognizing the correct individual, I verified that I am speaking with the correct person using two identifiers.  . Location of the patient: Home . Location of the provider: Office Only the patient (+/- their family members at pt's discretion) and myself were participating in the encounter    - This visit type was conducted due to national recommendations for restrictions regarding the COVID-19 Pandemic (e.g. social distancing) in an effort to limit this patient's exposure and mitigate transmission in our community.  This format is felt to be most appropriate for this patient at this time.   - The patient did have access to video technology today  - No physical exam could be performed with this format, beyond that communicated to [place] by the patient/ family members as noted.   - Additionally my office staff/ schedulers discussed with the patient that there may be a monetary charge related to this service, depending on patient's medical insurance.   The patient expressed understanding, and agreed to proceed.      History of Present Illness: I, Toni Amend, am serving as scribe for Dr. [name].  Labs Only (discuss lab results)   Notes she's been doing great.  - Vegan Diet With her vegan diet, she consumes milk and cheese substitutes.  - Current Smoker Smokes about 7-8 cigarettes per day.  She has quit for years at a time.  Notes "this last time that I started back, I had quit and then last winter/last spring I felt stuck at home with COVID and picked it up again.  I hadn't smoked for years before that."  She is interested in smoking cessation.  HPI:  Hyperlipidemia:  53 y.o. female here for cholesterol follow-up.  She is not aware of extensive family history of hyperlipidemia, however notes she has a  sister who is very thin and has high cholesterol.  The patient follows a vegan diet and drinks occasionally; "sometimes I'll go months and not drink anything."  Confirms she is not a regular drinker.  Notes she does eat a lot of carbs.  - She denies new onset of: myalgias, arthralgias, increased fatigue more than normal, chest pains, exercise intolerance, shortness of breath, dizziness, visual changes, headache, lower extremity swelling or claudication.   Most recent cholesterol panel was:  Lab Results  Component Value Date   CHOL 214 (H) 03/10/2019   HDL 48 03/10/2019   LDLCALC 136 (H) 03/10/2019   TRIG 166 (H) 03/10/2019   CHOLHDL 4.5 (H) 03/10/2019   Hepatic Function Latest Ref Rng & Units 03/10/2019  Total Protein 6.0 - 8.5 g/dL 6.9  Albumin 3.8 - 4.9 g/dL 4.3  AST 0 - 40 IU/L 16  ALT 0 - 32 IU/L 9  Alk Phosphatase 39 - 117 IU/L 72  Total Bilirubin 0.0 - 1.2 mg/dL 0.4     Depression screen [facility] 2/9 03/10/2019 02/04/2019  Decreased Interest 0 0  Down, Depressed, Hopeless 0 1  PHQ - 2 Score 0 1  Altered sleeping 2 3  Tired, decreased energy 0 0  Change in appetite 0 0  Feeling bad or failure about yourself  0 0  Trouble concentrating 0 0  Moving slowly or fidgety/restless 0 0  Suicidal thoughts 0 0  PHQ-9 Score 2 4  Difficult doing work/chores Not difficult at  all Not difficult at all    No flowsheet data found.  Recent Results (from the past 2160 hour(s))  T3     Status: None   Collection Time: 03/10/19  9:44 AM  Result Value Ref Range   T3, Total 124 71 - 180 ng/dL  T4, free     Status: None   Collection Time: 03/10/19  9:44 AM  Result Value Ref Range   Free T4 1.30 0.82 - 1.77 ng/dL  TSH     Status: None   Collection Time: 03/10/19  9:44 AM  Result Value Ref Range   TSH 1.420 0.450 - 4.500 uIU/mL  VITAMIN D 25 Hydroxy (Vit-D Deficiency, Fractures)     Status: Abnormal   Collection Time: 03/10/19  9:44 AM  Result Value Ref Range   Vit D, 25-Hydroxy 25.2  (L) 30.0 - 100.0 ng/mL    Comment: Vitamin D deficiency has been defined by the [place] and an Endocrine Society practice guideline as a level of serum 25-OH vitamin D less than 20 ng/mL (1,2). The Endocrine Society went on to further define vitamin D insufficiency as a level between 21 and 29 ng/mL (2). 1. IOM (Institute of Medicine). 2010. Dietary reference    intakes for calcium and D. [place]: The    [employer]. 2. Holick MF, Binkley [place], Bischoff-Ferrari HA, et al.    Evaluation, treatment, and prevention of vitamin D    deficiency: an Endocrine Society clinical practice    guideline. JCEM. 2011 Jul; 96(7):1911-30.   Lipid panel     Status: Abnormal   Collection Time: 03/10/19  9:44 AM  Result Value Ref Range   Cholesterol, Total 214 (H) 100 - 199 mg/dL   Triglycerides 166 (H) 0 - 149 mg/dL   HDL 48 >39 mg/dL   VLDL Cholesterol Cal 30 5 - 40 mg/dL   LDL Chol Calc (NIH) 136 (H) 0 - 99 mg/dL   Chol/HDL Ratio 4.5 (H) 0.0 - 4.4 ratio    Comment:                                   T. Chol/HDL Ratio                                             Men  Women                               1/2 Avg.Risk  3.4    3.3                                   Avg.Risk  5.0    4.4                                2X Avg.Risk  9.6    7.1                                3X Avg.Risk 23.4   11.0   Hemoglobin A1c     Status: None   Collection  Time: 03/10/19  9:44 AM  Result Value Ref Range   Hgb A1c MFr Bld 5.3 4.8 - 5.6 %    Comment:          Prediabetes: 5.7 - 6.4          Diabetes: >6.4          Glycemic control for adults with diabetes: <7.0    Est. average glucose Bld gHb Est-mCnc 105 mg/dL  Comprehensive metabolic panel     Status: None   Collection Time: 03/10/19  9:44 AM  Result Value Ref Range   Glucose 88 65 - 99 mg/dL   BUN 8 6 - 24 mg/dL   Creatinine, Ser 0.59 0.57 - 1.00 mg/dL   GFR calc non Af Amer 104 >59 mL/min/1.73   GFR calc Af Amer 119 >59  mL/min/1.73   BUN/Creatinine Ratio 14 9 - 23   Sodium 142 134 - 144 mmol/L   Potassium 4.5 3.5 - 5.2 mmol/L   Chloride 106 96 - 106 mmol/L   CO2 26 20 - 29 mmol/L   Calcium 9.5 8.7 - 10.2 mg/dL   Total Protein 6.9 6.0 - 8.5 g/dL   Albumin 4.3 3.8 - 4.9 g/dL   Globulin, Total 2.6 1.5 - 4.5 g/dL   Albumin/Globulin Ratio 1.7 1.2 - 2.2   Bilirubin Total 0.4 0.0 - 1.2 mg/dL   Alkaline Phosphatase 72 39 - 117 IU/L   AST 16 0 - 40 IU/L   ALT 9 0 - 32 IU/L  CBC with Differential/Platelet     Status: None   Collection Time: 03/10/19  9:44 AM  Result Value Ref Range   WBC 5.8 3.4 - 10.8 x10E3/uL   RBC 4.25 3.77 - 5.28 x10E6/uL   Hemoglobin 13.8 11.1 - 15.9 g/dL   Hematocrit 40.1 34.0 - 46.6 %   MCV 94 79 - 97 fL   MCH 32.5 26.6 - 33.0 pg   MCHC 34.4 31.5 - 35.7 g/dL   RDW 12.2 11.7 - 15.4 %   Platelets 252 150 - 450 x10E3/uL   Neutrophils 47 Not Estab. %   Lymphs 45 Not Estab. %   Monocytes 6 Not Estab. %   Eos 1 Not Estab. %   Basos 1 Not Estab. %   Neutrophils Absolute 2.7 1.4 - 7.0 x10E3/uL   Lymphocytes Absolute 2.6 0.7 - 3.1 x10E3/uL   Monocytes Absolute 0.4 0.1 - 0.9 x10E3/uL   EOS (ABSOLUTE) 0.1 0.0 - 0.4 x10E3/uL   Basophils Absolute 0.0 0.0 - 0.2 x10E3/uL   Immature Granulocytes 0 Not Estab. %   Immature Grans (Abs) 0.0 0.0 - 0.1 x10E3/uL  Hepatitis C antibody     Status: None   Collection Time: 03/10/19  9:44 AM  Result Value Ref Range   Hep C Virus Ab 0.1 0.0 - 0.9 s/co ratio    Comment:                                   Negative:     < 0.8                              Indeterminate: 0.8 - 0.9  Positive:     > 0.9  The CDC recommends that a positive HCV antibody result  be followed up with a HCV Nucleic Acid Amplification  test ([id number]).   Specimen status report     Status: None   Collection Time: 03/10/19  9:44 AM  Result Value Ref Range   specimen status report Comment     Comment: Written Authorization Written  Authorization Written Authorization Received. Authorization received from [facility]na Summerlin 03-28-2019 Logged by Sarah Baxendale   Ferritin     Status: None   Collection Time: 04/02/19 10:21 AM  Result Value Ref Range   Ferritin 65 15 - 150 ng/mL     Impression and Recommendations:    1. Vegan diet   2. Smoker- 10 pack yr or less   3. Mixed hyperlipidemia   4. Hypertriglyceridemia   5. Vitamin D deficiency   6. Family history of combined hyperlipidemia     - Per patient, she has not heard about her ColoGuard order yet.  Advised patient to call front desk for further information.  - Reviewed recent lab work (03/10/2019) in depth with patient today.  All lab work within normal limits unless otherwise noted.  Extensive education provided and all questions answered.  Vitamin D Deficiency - Last measured at 25.2, low. - Reviewed goal of 40-60 range. - Discussed beginning once-weekly Vitamin D.  See med list. - Patient may also incorporate multivitamin daily. - Encouraged patient to increase prudent sun exposure.  - Re-check in 3 months as discussed.  Vegan Diet - Mixed Hyperlipidemia, Hypertriglyceridemia; Family Hx Combined HLD Last fasting lipid panel: - HDL = 48 - Triglycerides = 166, elevated. - LDL = 136, elevated.  - The 10-year ASCVD risk score (Goff DC Jr., et al., 2013) is: 3.4%  - Discussed that patient's cholesterol levels are elevated.  - Prudent dietary changes such as low saturated & trans fat diets for hyperlipidemia and low carb diets for hypertriglyceridemia discussed with patient.  Told patient to pay close attention to nutrition labels and incorporate fish oil to diet if desired.  - Encouraged patient to follow AHA guidelines for regular exercise, 30-45 minutes daily, and also engage in weight loss if BMI above 25.   - Educational handouts provided at patient's desire and/ or told to look online at the American Heart Association website for further  information.  - We will continue to monitor and re-check as dicussed.  Current Smoker, 10 pk year - Tobacco Abuse, Smoking Cessation Counseling - Told pt to think seriously about quitting smoking!  Told pt it is very important for his/her health and well being.  - Smoking cessation instruction/ counseling given of at least 5 minutes:  counseled patient on the dangers of tobacco use and reviewed strategies to maximize success - Discussed with patient that there are multiple treatments to aid in quitting smoking, however I explained none will work unless pt really wants to quit - Told to call 1-800-QUIT-NOW (1-800-7848-669) for free smoking cessation counseling and support, or pt can go online to www.heart.org - the American Heart Association website and search "quit smoking ".   Recommendations - Return in 3 months for re-check Vitamin D. - Patient will call if she desires assistance with smoking cessation. - Otherwise, return for lab f/up in a year.   - As part of my medical decision making, I reviewed the following data within the electronic medical record:  History obtained from pt /family, CMA notes reviewed and incorporated if applicable,   Labs reviewed, Radiograph/ tests reviewed if applicable and OV notes from prior OV's with me, as well as other specialists she/he has seen since seeing me last, were all reviewed and used in my medical decision making process today.   - Additionally, discussion had with patient regarding txmnt plan, their biases about that plan etc were used in my medical decision making today.   - The patient agreed with the plan and demonstrated an understanding of the instructions.   No barriers to understanding were identified.    - Red flag symptoms and signs discussed in detail.  Patient expressed understanding regarding what to do in case of emergency\ urgent symptoms.  The patient was advised to call back or seek an in-person evaluation if the symptoms worsen or if  the condition fails to improve as anticipated.   Return for re-check Vitamin D in 3 months, f/up OV 6 mo, sooner if desires smoking cessation.    Orders Placed This Encounter  Procedures  . VITAMIN D 25 Hydroxy (Vit-D Deficiency, Fractures)    Meds ordered this encounter  Medications  . Vitamin D, Ergocalciferol, (DRISDOL) 1.25 MG (50000 UT) CAPS capsule    Sig: Take one tablet wkly    Dispense:  12 capsule    Refill:  3    There are no discontinued medications.    Note:  This note was prepared with assistance of Dragon voice recognition software. Occasional wrong-word or sound-a-like substitutions may have occurred due to the inherent limitations of voice recognition software.  This document serves as a record of services personally performed by Deborah Opalski, DO. It was created on her behalf by Katherine Galloway, a trained medical scribe. The creation of this record is based on the scribe's personal observations and the provider's statements to them.   This case required medical decision making of at least moderate complexity. The above documentation has been reviewed to be accurate and was completed by Deborah J. Opalski, D.O.        Patient Care Team    Relationship Specialty Notifications Start End  Opalski, Deborah, DO PCP - General Family Medicine  02/04/19   Sankar, Seeplaputhur G, MD  General Surgery  07/31/12   [facility]    02/04/19     -Vitals obtained; medications/ allergies reconciled;  personal medical, social, Sx etc.histories were updated by CMA, reviewed by me and are reflected in chart  Patient Active Problem List   Diagnosis Date Noted  . Mixed hyperlipidemia 04/14/2019  . Hypertriglyceridemia 04/14/2019  . Vitamin D deficiency 04/14/2019  . Family history of combined hyperlipidemia 04/14/2019  . Hx of appendectomy 02/04/2019  . Status post hysterectomy with oophorectomy-2015 02/04/2019  . Vegan diet 02/04/2019  . Smoker- 10  pack yr or less 02/04/2019  . Breast mass, right   . Breast microcalcification, mammographic 07/31/2012  . Lump or mass in breast 07/31/2012     Current Meds  Medication Sig  . Ascorbic Acid (VITAMIN C) 100 MG tablet Take 100 mg by mouth daily.  . Biotin 10 MG CAPS Take 1 capsule by mouth daily.  . cyanocobalamin 100 MCG tablet Take 100 mcg by mouth daily.  . Multiple Vitamin (MULTIVITAMIN) tablet Take 1 tablet by mouth daily.     Allergies  Allergen Reactions  . Codeine Nausea Only  . Wellbutrin [Bupropion]      ROS:  See above HPI for pertinent positives and negatives   Objective:   Height 5' 7" (1.702 m), weight 144   lb (65.3 kg), last menstrual period 07/02/2012.  (if some vitals are omitted, this means that patient was UNABLE to obtain them even though they were asked to get them prior to OV today.  They were asked to call us at their earliest convenience with these once obtained.)  General: A & O * 3; visually in no acute distress; in usual state of health.  Skin: Visible skin appears normal and pt's usual skin color HEENT:  EOMI, head is normocephalic and atraumatic.  Sclera are anicteric. Neck has a good range of motion.  Lips are noncyanotic Chest: normal chest excursion and movement Respiratory: speaking in full sentences, no conversational dyspnea; no use of accessory muscles Psych: insight good, mood- appears full    

## 2019-05-06 ENCOUNTER — Other Ambulatory Visit: Payer: Self-pay

## 2019-05-06 ENCOUNTER — Ambulatory Visit
Admission: RE | Admit: 2019-05-06 | Discharge: 2019-05-06 | Disposition: A | Discharge status: Home / Self Care | Payer: 59 | Source: Ambulatory Visit | Attending: Family Medicine | Admitting: Family Medicine

## 2019-05-06 DIAGNOSIS — Z1231 Encounter for screening mammogram for malignant neoplasm of breast: Secondary | ICD-10-CM

## 2019-05-08 ENCOUNTER — Other Ambulatory Visit: Payer: Self-pay | Admitting: Family Medicine

## 2019-05-08 DIAGNOSIS — R921 Mammographic calcification found on diagnostic imaging of breast: Secondary | ICD-10-CM

## 2019-05-08 DIAGNOSIS — N63 Unspecified lump in unspecified breast: Secondary | ICD-10-CM

## 2019-12-30 ENCOUNTER — Other Ambulatory Visit: Payer: Self-pay

## 2019-12-30 ENCOUNTER — Other Ambulatory Visit: Payer: Self-pay | Admitting: Critical Care Medicine

## 2019-12-30 ENCOUNTER — Other Ambulatory Visit: Payer: 59

## 2019-12-30 DIAGNOSIS — Z20822 Contact with and (suspected) exposure to covid-19: Secondary | ICD-10-CM

## 2020-01-01 ENCOUNTER — Telehealth: Payer: Self-pay | Admitting: Physician Assistant

## 2020-01-01 LAB — NOVEL CORONAVIRUS, NAA: SARS-CoV-2, NAA: NOT DETECTED

## 2020-01-01 LAB — SARS-COV-2, NAA 2 DAY TAT

## 2020-01-01 NOTE — Telephone Encounter (Signed)
Patient is calling to receive her negative COVID test results. Patient expressed understanding.

## 2020-01-08 NOTE — Telephone Encounter (Signed)
Patient called into office to get the whooping cough vaccine.  Please Advise patient if she is able to get it.

## 2020-01-08 NOTE — Telephone Encounter (Signed)
Patient is able to receive tdap vaccine. Patient scheduled to come in Monday for vaccine. AS, CMA

## 2020-01-11 ENCOUNTER — Other Ambulatory Visit: Payer: Self-pay

## 2020-01-11 ENCOUNTER — Ambulatory Visit (INDEPENDENT_AMBULATORY_CARE_PROVIDER_SITE_OTHER): Payer: 59 | Admitting: Physician Assistant

## 2020-01-11 VITALS — BP 112/71 | HR 78 | Temp 97.6°F | Ht 68.0 in | Wt 127.0 lb

## 2020-01-11 DIAGNOSIS — Z23 Encounter for immunization: Secondary | ICD-10-CM | POA: Diagnosis not present

## 2020-01-11 NOTE — Progress Notes (Signed)
Patient in clinic today for Tdap vaccine. Patient states last vaccine about 20 yrs ago. Patient does report localized reaction and some fever and sickness x 3 days with last injection. VIS given. I advised patient to call office should symptoms start. Patient is aware to go to ED if she experiences SOB or difficulty breathing. Patient verbalized understanding.   Patient tolerated vaccine well. AS, CMA  Agree with recommendations above. Patient is UTD on CPE.  Lorrene Reid, PA-C

## 2020-03-29 ENCOUNTER — Other Ambulatory Visit: Payer: Self-pay | Admitting: Family Medicine

## 2020-03-29 DIAGNOSIS — E559 Vitamin D deficiency, unspecified: Secondary | ICD-10-CM

## 2020-05-25 DIAGNOSIS — H00015 Hordeolum externum left lower eyelid: Secondary | ICD-10-CM | POA: Insufficient documentation

## 2020-06-27 ENCOUNTER — Ambulatory Visit (INDEPENDENT_AMBULATORY_CARE_PROVIDER_SITE_OTHER): Payer: 59 | Admitting: Physician Assistant

## 2020-06-27 ENCOUNTER — Encounter: Payer: Self-pay | Admitting: Physician Assistant

## 2020-06-27 ENCOUNTER — Other Ambulatory Visit: Payer: Self-pay

## 2020-06-27 VITALS — BP 98/65 | HR 86 | Temp 98.4°F | Wt 125.5 lb

## 2020-06-27 DIAGNOSIS — J302 Other seasonal allergic rhinitis: Secondary | ICD-10-CM | POA: Diagnosis not present

## 2020-06-27 DIAGNOSIS — R3915 Urgency of urination: Secondary | ICD-10-CM | POA: Diagnosis not present

## 2020-06-27 DIAGNOSIS — E559 Vitamin D deficiency, unspecified: Secondary | ICD-10-CM

## 2020-06-27 DIAGNOSIS — R102 Pelvic and perineal pain: Secondary | ICD-10-CM

## 2020-06-27 DIAGNOSIS — R251 Tremor, unspecified: Secondary | ICD-10-CM

## 2020-06-27 MED ORDER — IPRATROPIUM BROMIDE 0.03 % NA SOLN: 2.0000 | Freq: Two times a day (BID) | NASAL | 0 refills | Status: AC

## 2020-06-27 NOTE — Patient Instructions (Signed)

## 2020-06-27 NOTE — Progress Notes (Signed)
Established Patient Office Visit  Subjective:  Patient ID: Sheryl Pratt, female    DOB: 06/01/1965  Age: 55 y.o. MRN: [id number]  CC:  Chief Complaint  Patient presents with  . vitamin D    HPI FERGIE SHERBERT presents for follow up on vitamin D deficiency. Needs medication refill. Also has c/o of intermittent hand shaking, especially when eating. No FHx of Parkinson's but does have a sister with PSP. Denies muscle weakness, confusion, unsteady balance, dizziness, or cognition changes. Has eliminated caffeine. Also reports seasonal allergies with head congestion, postnasal drainage, ear fullness, and runny nose. Takes non-drowsy benadryl which provides some relief. Has tried Claritin in the past which was ineffective. Also reports the las couple of months has been experiencing urinary urgency and lower abdomen pressure. Denies dysuria, malodorous urine or back pain.  Past Medical History:  Diagnosis Date  . Allergy   . Breast mass, right     Past Surgical History:  Procedure Laterality Date  . ABDOMINAL HYSTERECTOMY  2015  . APPENDECTOMY    . AUGMENTATION MAMMAPLASTY Bilateral   . BREAST BIOPSY Right 2014  . BREAST SURGERY  18 years ago   augmentation.    Family History  Problem Relation Age of Onset  . Cancer Brother        pancreatic    Social History   Socioeconomic History  . Marital status: Married    Spouse name: Shontelle Muska  . Number of children: Not on file  . Years of education: Not on file  . Highest education level: Not on file  Occupational History  . Not on file  Tobacco Use  . Smoking status: Current Every Day Smoker    Packs/day: 0.25    Years: 10.00    Pack years: 2.50    Types: Cigarettes  . Smokeless tobacco: Never Used  Vaping Use  . Vaping Use: Never used  Substance and Sexual Activity  . Alcohol use: No  . Drug use: No  . Sexual activity: Yes    Birth control/protection: None  Other Topics Concern  . Not on file  Social History  Narrative  . Not on file   Social Determinants of Health   Financial Resource Strain: Not on file  Food Insecurity: Not on file  Transportation Needs: Not on file  Physical Activity: Not on file  Stress: Not on file  Social Connections: Not on file  Intimate Partner Violence: Not on file    Outpatient Medications Prior to Visit  Medication Sig Dispense Refill  . Ascorbic Acid (VITAMIN C) 100 MG tablet Take 100 mg by mouth daily.    . Biotin 10 MG CAPS Take 1 capsule by mouth daily.    . cyanocobalamin 100 MCG tablet Take 100 mcg by mouth daily.    . Multiple Vitamin (MULTIVITAMIN) tablet Take 1 tablet by mouth daily.    . Vitamin D, Ergocalciferol, (DRISDOL) 1.25 MG (50000 UT) CAPS capsule Take one tablet wkly (Patient not taking: Reported on 06/27/2020) 12 capsule 3   No facility-administered medications prior to visit.    Allergies  Allergen Reactions  . Codeine Nausea Only  . Wellbutrin [Bupropion]     ROS Review of Systems A fourteen system review of systems was performed and found to be positive as per HPI.   Objective:    Physical Exam General:  Well Developed, well nourished, appropriate for stated age.  Neuro:  Alert and oriented,  extra-ocular muscles intact, CN II-XII intact, no tremor  at rest, tremor against gravity and with finger-nose-finger test noted, +Archimedes spiral, normal heel-to-shin test, normal gait  HEENT:  Normocephalic, atraumatic, no TTP of frontal or maxillary sinus, normal TM's with minimal effusion noted of both ears, normal external canal of both ears, slightly boggy turbinates, mildly erythematous posterior oropharynx without exudates, no cervical adenopathy   Skin:  no gross rash, warm, pink. Cardiac:  RRR, S1 S2 wnl's Respiratory:  ECTA B/L and A/P, Not using accessory muscles, speaking in full sentences- unlabored. Vascular:  Ext warm, no cyanosis apprec.; cap RF less 2 sec. Psych:  No HI/SI, judgement and insight good, Euthymic mood.  Full Affect.   BP 98/65   Pulse 86   Temp 98.4 F (36.9 C)   Wt 125 lb 8 oz (56.9 kg)   LMP 07/02/2012   SpO2 97%   BMI 19.08 kg/m  Wt Readings from Last 3 Encounters:  06/27/20 125 lb 8 oz (56.9 kg)  01/11/20 127 lb (57.6 kg)  04/14/19 144 lb (65.3 kg)     Health Maintenance Due  Topic Date Due  . COVID-19 Vaccine (1) Never done  . HIV Screening  Never done  . PAP SMEAR-Modifier  Never done  . MAMMOGRAM  07/30/2014    There are no preventive care reminders to display for this patient.  Lab Results  Component Value Date   TSH 1.420 03/10/2019   Lab Results  Component Value Date   WBC 5.8 03/10/2019   HGB 13.8 03/10/2019   HCT 40.1 03/10/2019   MCV 94 03/10/2019   PLT 252 03/10/2019   Lab Results  Component Value Date   NA 142 03/10/2019   K 4.5 03/10/2019   CO2 26 03/10/2019   GLUCOSE 88 03/10/2019   BUN 8 03/10/2019   CREATININE 0.59 03/10/2019   BILITOT 0.4 03/10/2019   ALKPHOS 72 03/10/2019   AST 16 03/10/2019   ALT 9 03/10/2019   PROT 6.9 03/10/2019   ALBUMIN 4.3 03/10/2019   CALCIUM 9.5 03/10/2019   Lab Results  Component Value Date   CHOL 214 (H) 03/10/2019   Lab Results  Component Value Date   HDL 48 03/10/2019   Lab Results  Component Value Date   LDLCALC 136 (H) 03/10/2019   Lab Results  Component Value Date   TRIG 166 (H) 03/10/2019   Lab Results  Component Value Date   CHOLHDL 4.5 (H) 03/10/2019   Lab Results  Component Value Date   HGBA1C 5.3 03/10/2019      Assessment & Plan:   Problem List Items Addressed This Visit      Other   Vitamin D deficiency - Primary    Other Visit Diagnoses    Seasonal allergic rhinitis, unspecified trigger       Relevant Medications   ipratropium (ATROVENT) 0.03 % nasal spray   Pelvic pressure in female       Urinary urgency       Tremor of both hands         Vitamin D deficiency: -Last Vit D 25.2, advised patient to schedule lab visit for FBW including Vit D. Pending results  will send additional refills or adjust treatment plan if indicated.  Tremor of both hands: -Discussed with patient various etiologies, no ataxia and no resting tremor noted on exam so less likely PD. Tremor has mixture characteristics of intention and essential tremor. Will place lab orders to r/o endocrine metabolic etiology.  -If tremor worsens or fails to improve recommend trial low  dose beta blocker or further evaluation with imaging (head CT or brain MRI).  Seasonal allergic rhinitis, unspecified trigger:  -Recommend to take oral antihistamine such as Xyzal or Zyrtec and will send nasal spray Atrovent to use before bedtime. Recommend to use OTC Flonase if Atrovent is ineffective.  Pelvic pressure in female, Urinary urgency: -Recommend to avoid diuretic beverages. -If symptoms fail to improve or worsen recommend referral to Urology. Patient will monitor symptoms at this time.   Meds ordered this encounter  Medications  . ipratropium (ATROVENT) 0.03 % nasal spray    Sig: Place 2 sprays into both nostrils every 12 (twelve) hours.    Dispense:  30 mL    Refill:  0    Follow-up: Return in about 3 months (around 09/27/2020) for CPE with lab work in 1 week for FBW.   Note:  This note was prepared with assistance of Dragon voice recognition software. Occasional wrong-word or sound-a-like substitutions may have occurred due to the inherent limitations of voice recognition software.  Lorrene Reid, PA-C

## 2020-06-30 ENCOUNTER — Other Ambulatory Visit: Payer: 59

## 2020-07-30 ENCOUNTER — Other Ambulatory Visit: Payer: Self-pay

## 2020-07-30 ENCOUNTER — Emergency Department (HOSPITAL_BASED_OUTPATIENT_CLINIC_OR_DEPARTMENT_OTHER)
Admission: EM | Admit: 2020-07-30 | Discharge: 2020-07-30 | Disposition: A | Discharge status: Home / Self Care | Payer: 59 | Attending: Emergency Medicine | Admitting: Emergency Medicine

## 2020-07-30 ENCOUNTER — Encounter (HOSPITAL_BASED_OUTPATIENT_CLINIC_OR_DEPARTMENT_OTHER): Payer: Self-pay

## 2020-07-30 ENCOUNTER — Emergency Department (HOSPITAL_BASED_OUTPATIENT_CLINIC_OR_DEPARTMENT_OTHER): Payer: 59

## 2020-07-30 DIAGNOSIS — R1031 Right lower quadrant pain: Secondary | ICD-10-CM | POA: Diagnosis present

## 2020-07-30 DIAGNOSIS — R1909 Other intra-abdominal and pelvic swelling, mass and lump: Secondary | ICD-10-CM | POA: Diagnosis not present

## 2020-07-30 DIAGNOSIS — F17210 Nicotine dependence, cigarettes, uncomplicated: Secondary | ICD-10-CM | POA: Diagnosis not present

## 2020-07-30 DIAGNOSIS — K5900 Constipation, unspecified: Secondary | ICD-10-CM | POA: Diagnosis not present

## 2020-07-30 DIAGNOSIS — R1900 Intra-abdominal and pelvic swelling, mass and lump, unspecified site: Secondary | ICD-10-CM

## 2020-07-30 LAB — CBC WITH DIFFERENTIAL/PLATELET
Abs Immature Granulocytes: 0.01 10*3/uL (ref 0.00–0.07)
Basophils Absolute: 0 10*3/uL (ref 0.0–0.1)
Basophils Relative: 0 %
Eosinophils Absolute: 0.1 10*3/uL (ref 0.0–0.5)
Eosinophils Relative: 1 %
HCT: 37.2 % (ref 36.0–46.0)
Hemoglobin: 12.2 g/dL (ref 12.0–15.0)
Immature Granulocytes: 0 %
Lymphocytes Relative: 34 %
Lymphs Abs: 2.4 10*3/uL (ref 0.7–4.0)
MCH: 31.4 pg (ref 26.0–34.0)
MCHC: 32.8 g/dL (ref 30.0–36.0)
MCV: 95.9 fL (ref 80.0–100.0)
Monocytes Absolute: 0.5 10*3/uL (ref 0.1–1.0)
Monocytes Relative: 7 %
Neutro Abs: 4.1 10*3/uL (ref 1.7–7.7)
Neutrophils Relative %: 58 %
Platelets: 260 10*3/uL (ref 150–400)
RBC: 3.88 MIL/uL (ref 3.87–5.11)
RDW: 12.3 % (ref 11.5–15.5)
WBC: 7.2 10*3/uL (ref 4.0–10.5)
nRBC: 0 % (ref 0.0–0.2)

## 2020-07-30 LAB — COMPREHENSIVE METABOLIC PANEL
ALT: 9 U/L (ref 0–44)
AST: 15 U/L (ref 15–41)
Albumin: 3.6 g/dL (ref 3.5–5.0)
Alkaline Phosphatase: 60 U/L (ref 38–126)
Anion gap: 7 (ref 5–15)
BUN: 11 mg/dL (ref 6–20)
CO2: 22 mmol/L (ref 22–32)
Calcium: 8.8 mg/dL — ABNORMAL LOW (ref 8.9–10.3)
Chloride: 108 mmol/L (ref 98–111)
Creatinine, Ser: 0.48 mg/dL (ref 0.44–1.00)
GFR, Estimated: >60 mL/min (ref >60)
Glucose, Bld: 96 mg/dL (ref 70–99)
Potassium: 3.8 mmol/L (ref 3.5–5.1)
Sodium: 137 mmol/L (ref 135–145)
Total Bilirubin: 0.2 mg/dL — ABNORMAL LOW (ref 0.3–1.2)
Total Protein: 6.6 g/dL (ref 6.5–8.1)

## 2020-07-30 MED ORDER — IOHEXOL 300 MG/ML  SOLN
100.0000 mL | Freq: Once | INTRAMUSCULAR | Status: AC | PRN
  Administered 2020-07-30: 100 mL via INTRAVENOUS

## 2020-07-30 NOTE — ED Provider Notes (Signed)
[facility] EMERGENCY DEPARTMENT Provider Note   CSN: [id number] Arrival date & time: 07/30/20  1142     History Chief Complaint  Patient presents with  . Abdominal Pain    Sheryl Pratt is a 54 y.o. female.  Patient presents for evaluation of RLQ abdominal pain. She reports diffuse "abdominal soreness" off and on over the past month. No nausea, vomiting, fever. She reports daily bowel movements but states they are far less volume than her normal. No melena or hard stool. No urinary symptoms. History of appendectomy and hysterectomy. Today she felt a mass in the RLQ that is firm, tender. Seen at Urgent Care and presents to the ED for further evaluation.   The history is provided by the patient. No language interpreter was used.  Abdominal Pain Associated symptoms: constipation   Associated symptoms: no chest pain, no chills, no fever, no nausea, no shortness of breath and no vomiting        Past Medical History:  Diagnosis Date  . Allergy   . Breast mass, right     Patient Active Problem List   Diagnosis Date Noted  . Mixed hyperlipidemia 04/14/2019  . Hypertriglyceridemia 04/14/2019  . Vitamin D deficiency 04/14/2019  . Family history of combined hyperlipidemia 04/14/2019  . Hx of appendectomy 02/04/2019  . Status post hysterectomy with oophorectomy-2015 02/04/2019  . Vegan diet 02/04/2019  . Smoker- 10 pack yr or less 02/04/2019  . Breast mass, right   . Breast microcalcification, mammographic 07/31/2012  . Lump or mass in breast 07/31/2012    Past Surgical History:  Procedure Laterality Date  . ABDOMINAL HYSTERECTOMY  2015  . APPENDECTOMY    . AUGMENTATION MAMMAPLASTY Bilateral   . BREAST BIOPSY Right 2014  . BREAST SURGERY  18 years ago   augmentation.     OB History    Gravida  4   Para  2   Term      Preterm      AB  2   Living        SAB  2   IAB      Ectopic      Multiple      Live Births           Obstetric Comments   1st Menstrual Cycle:  12 1st Pregnancy: 34        Family History  Problem Relation Age of Onset  . Cancer Brother        pancreatic    Social History   Tobacco Use  . Smoking status: Current Every Day Smoker    Packs/day: 0.25    Years: 10.00    Pack years: 2.50    Types: Cigarettes  . Smokeless tobacco: Never Used  Vaping Use  . Vaping Use: Never used  Substance Use Topics  . Alcohol use: No  . Drug use: No    Home Medications Prior to Admission medications   Medication Sig Start Date End Date Taking? Authorizing Provider  Ascorbic Acid (VITAMIN C) 100 MG tablet Take 100 mg by mouth daily.    [provider]  Biotin 10 MG CAPS Take 1 capsule by mouth daily.    [provider]  cyanocobalamin 100 MCG tablet Take 100 mcg by mouth daily.    [provider]  ipratropium (ATROVENT) 0.03 % nasal spray Place 2 sprays into both nostrils every 12 (twelve) hours. 06/27/20   Lorrene Reid, PA-C  Multiple Vitamin (MULTIVITAMIN) tablet Take 1  tablet by mouth daily.    [provider]  Vitamin D, Ergocalciferol, (DRISDOL) 1.25 MG (50000 UT) CAPS capsule Take one tablet wkly Patient not taking: Reported on 06/27/2020 04/14/19   [name], DO    Allergies    Codeine and Wellbutrin [bupropion]  Review of Systems   Review of Systems  Constitutional: Negative for chills and fever.  Respiratory: Negative.  Negative for shortness of breath.   Cardiovascular: Negative.  Negative for chest pain.  Gastrointestinal: Positive for abdominal pain and constipation. Negative for blood in stool, nausea and vomiting.  Genitourinary: Negative.   Musculoskeletal: Negative.  Negative for back pain.  Skin: Negative.   Neurological: Negative.     Physical Exam Updated Vital Signs BP 119/84   Pulse 78   Temp 98.2 F (36.8 C) (Oral)   Resp 17   Ht 5\' 8"  (1.727 m)   Wt 54.4 kg   LMP 07/02/2012   SpO2 100%   BMI 18.25 kg/m   Physical  Exam Constitutional:      Appearance: She is well-developed.  HENT:     Head: Normocephalic.  Cardiovascular:     Rate and Rhythm: Normal rate and regular rhythm.  Pulmonary:     Effort: Pulmonary effort is normal.     Breath sounds: Normal breath sounds.  Abdominal:     General: There is no distension.     Palpations: Abdomen is soft. There is mass.     Tenderness: There is abdominal tenderness in the right lower quadrant. There is no guarding or rebound.       Comments: Firm, nonmobile mass  Musculoskeletal:        General: Normal range of motion.     Cervical back: Normal range of motion and neck supple.  Skin:    General: Skin is warm and dry.     Findings: No rash.  Neurological:     Mental Status: She is alert.     Cranial Nerves: No cranial nerve deficit.     ED Results / Procedures / Treatments   Labs (all labs ordered are listed, but only abnormal results are displayed) Labs Reviewed - No data to display  EKG None  Radiology No results found.  Procedures Procedures   Medications Ordered in ED Medications - No data to display  ED Course  I have reviewed the triage vital signs and the nursing notes.  Pertinent labs & imaging results that were available during my care of the patient were reviewed by me and considered in my medical decision making (see chart for details).    MDM Rules/Calculators/A&P                          Patient to ED from Urgent Care for CT evaluation of suspected right inguinal hernia.   She is well appearing and in NAD. She declines anything for pain and appears comfortable. Labs pending, CT ordered.   CT reviewed and shows a large pelvic mass. Ovaries are not visualized. There is a small spot on the left adrenal gland that, given the new find of pelvic mass, is concerning for metastatic disease.   These results were discussed with the patient and all questions were answered. An MRI is recommended and this was offered to the  patient, but she prefers to follow up outpatient for further work up.   Dr. Delsa Sale, GYN oncology, was consulted to facilitate further evaluation who states her office will contact  the patient on Monday to schedule an appointment. Labs requested - CEA, CA125, CA19-9, were collected. Appreciate her help with this patient's care.     Final Clinical Impression(s) / ED Diagnoses Final diagnoses:  None   1. Pelvic Mass  Rx / DC Orders ED Discharge Orders    None       Charlann Lange, PA-C 07/30/20 [place], [place], DO 07/30/20 1449

## 2020-07-30 NOTE — Discharge Instructions (Signed)
The office of Dr. Lahoma Crocker, Gynecologic Oncology, will be contacting you on Monday to schedule an appointment. Labs that she requested to review at that time have been collected and sent today.   Please return to the ED if you develop any symptoms of concern.

## 2020-07-30 NOTE — ED Triage Notes (Signed)
Patient reports a 'lump' in the RLQ of abdomen x1 month. Was lying flat rubbing stomach and discovered a lump. Went to urgent care today and was sent here. Had an X-Ray done and there is suspicion for a hernia. 7/10 intermittent pain when it is at its worst, and 3/10 now while resting.

## 2020-08-01 LAB — CANCER ANTIGEN 19-9: CA 19-9: 5 U/mL (ref 0–35)

## 2020-08-01 LAB — CA 125: Cancer Antigen (CA) 125: 34.9 U/mL (ref 0.0–38.1)

## 2020-08-01 LAB — CEA: CEA: 2.3 ng/mL (ref 0.0–4.7)

## 2020-08-02 ENCOUNTER — Encounter: Payer: Self-pay | Admitting: Gynecologic Oncology

## 2020-08-02 ENCOUNTER — Other Ambulatory Visit: Payer: Self-pay | Admitting: Gynecologic Oncology

## 2020-08-02 ENCOUNTER — Inpatient Hospital Stay: Payer: 59 | Attending: Gynecologic Oncology | Admitting: Gynecologic Oncology

## 2020-08-02 ENCOUNTER — Other Ambulatory Visit: Payer: Self-pay

## 2020-08-02 VITALS — BP 103/76 | HR 82 | Temp 96.8°F | Resp 20 | Wt 124.0 lb

## 2020-08-02 DIAGNOSIS — K5900 Constipation, unspecified: Secondary | ICD-10-CM | POA: Diagnosis not present

## 2020-08-02 DIAGNOSIS — R1900 Intra-abdominal and pelvic swelling, mass and lump, unspecified site: Secondary | ICD-10-CM | POA: Diagnosis not present

## 2020-08-02 DIAGNOSIS — F17210 Nicotine dependence, cigarettes, uncomplicated: Secondary | ICD-10-CM | POA: Insufficient documentation

## 2020-08-02 MED ORDER — IBUPROFEN 800 MG PO TABS: 800.0000 mg | ORAL_TABLET | Freq: Three times a day (TID) | ORAL | 0 refills | Status: AC | PRN

## 2020-08-02 MED ORDER — SENNOSIDES-DOCUSATE SODIUM 8.6-50 MG PO TABS: 2.0000 | ORAL_TABLET | Freq: Every day | ORAL | 0 refills | Status: AC

## 2020-08-02 MED ORDER — TRAMADOL HCL 50 MG PO TABS: 50.0000 mg | ORAL_TABLET | Freq: Four times a day (QID) | ORAL | 0 refills | Status: DC | PRN

## 2020-08-02 NOTE — Progress Notes (Signed)
GYNECOLOGIC ONCOLOGY NEW PATIENT CONSULTATION   Patient Name: Sheryl Pratt  Patient Age: 55 y.o. Date of Service: 08/02/20 Referring Provider: Charlann Lange PA  Primary Care Provider: Lorrene Reid, PA-C Consulting Provider: Jeral Pinch, MD   Assessment/Plan:  Postmenopausal patient with a complex pelvic mass.  I discussed in detail patient's recent visit to the emergency department as well as her CT findings and lab testing.  I reviewed the CT images with the patient and her husband and discussed abnormal findings.  She has a large, complex appearing pelvic mass without definitive identification of her bilateral adnexa.  While the origin of this mass is unknown, my suspicion is that it is arising from one of her adnexa.  While her normal tumor markers are reassuring, we discussed that, for instance in the case of ovarian cancer, CA-125 can be within normal limits and up to 50% of early stage ovarian cancer.  The only way to make a definitive diagnosis and to help relieve her significant symptom burden is to proceed with surgical resection.  Patient is a good candidate for surgery and wishes to move forward with scheduling surgical resection.  Given the size and weight of the mass, I am recommending open surgery.  I would plan to start with a diagnostic laparoscopy to assure that the origin of this mass is in fact from the pelvic organs.  If confirmed, then plan would be for an exploratory laparotomy, excision of the mass, bilateral salpingo-oophorectomy.  We will plan to send the mass for intraoperative frozen section.  If malignancy identified, then the patient understands that she may need additional surgery including peritoneal biopsies, lymphadenectomy, omentectomy, and any other indicated procedures.  We discussed the risks of surgery including but not limited to bleeding, need for blood transfusion, infection, damage to surrounding structures requiring repair, unseen damage requiring  future hospitalization or surgery, medical complications related to surgery and anesthesia (including VTE, stroke, heart attack, and death).  If lymphadenectomy is necessary, we also discussed the risk of lymphedema and development of a lymphocyst or lymphocele.  Surgery was tentatively scheduled for April 26.  Given her constipation and stool burden on CT scan, I am recommending that she start a combination laxative stool softener until surgery.  Perioperative instructions were reviewed with her today and postoperative prescription sent to her pharmacy.  We discussed the importance of decreasing tobacco use between now and surgery if she is able to.  A copy of this note was sent to the patient's referring provider.  57 minutes of total time was spent for this patient encounter, including preparation, face-to-face counseling with the patient and coordination of care, and documentation of the encounter.   Jeral Pinch, MD  Division of Gynecologic Oncology  Department of Obstetrics and Gynecology  [facility]  ___________________________________________  Chief Complaint: Chief Complaint  Patient presents with  . Pelvic mass    History of Present Illness:  Sheryl Pratt is a 55 y.o. y.o. female who is seen in consultation at the request of Charlann Lange for an evaluation of newly diagnosed and symptomatic pelvic mass.  The patient's history is notable for a vaginal hysterectomy and modified McCall culdoplasty in 2014 in the setting of pelvic organ prolapse.  Pathology from that showed no evidence of hyperplasia, atypia, or malignancy.  Last summer, the patient noted increased urinary frequency.  More recently, about 4 weeks ago, she started noticing intermittent abdominal pain that she describes as sometimes being cramping in nature  and other times sharp.  She thought this was secondary to constipation, which she also developed around the same time.  She notes that  bowel movements are sometimes daily although sometimes she goes more than a day without a bowel movement.  She defecates only small amounts and often has significant pain with the bowel movement, noting relief as soon as she defecates.  Approximately 2 weeks ago, she has noticed increasing abdominal girth with a mass in her lower abdomen.  She endorses a good appetite without nausea or emesis.  She denies any early satiety or abdominal bloating.  She was seen recently in the emergency department for evaluation of right lower quadrant pain.  She underwent CT scan at that visit showing a complex pelvic mass.  Tumor markers at that time were within normal limits including CEA, CA 19-9, and CEA-125.  She had a primary care provider however between that person leaving the practice as well as the pandemic, she has not had frequent medical care the last couple of years.  She finally got into a new office and saw a physician's assistant last month.  She is scheduled for an annual exam in June.  Tumor markers: CEA 2.3 CA-125 34.9 CA 19-9 5  The patient was not pleasant garden with her husband, [name].  Her son and daughter live in [place] and she spends a fair amount of time there.  She had been caring for her sister.  PAST MEDICAL HISTORY:  Past Medical History:  Diagnosis Date  . Allergy   . Breast mass, right      PAST SURGICAL HISTORY:  Past Surgical History:  Procedure Laterality Date  . APPENDECTOMY    . AUGMENTATION MAMMAPLASTY Bilateral   . BREAST BIOPSY Right 2014   fibroadenoma  . BREAST SURGERY  18 years ago   augmentation.  Marland Kitchen VAGINAL HYSTERECTOMY  2015    OB/GYN HISTORY:  OB History  Gravida Para Term Preterm AB Living  4 2     2     SAB IAB Ectopic Multiple Live Births  2            # Outcome Date GA Lbr Len/2nd Weight Sex Delivery Anes PTL Lv  4 SAB           3 SAB           2 Para           1 Para             Obstetric Comments  1st Menstrual Cycle:  12  1st  Pregnancy: 16    Patient's last menstrual period was 07/02/2012.  Age at menarche: 43 Age at menopause: Hysterectomy at age 39, denies significant hot flashes Hx of HRT: Denies Hx of STDs: Denies Last pap: Unsure, thinks before her hysterectomy History of abnormal pap smears: Denies  SCREENING STUDIES:  Last mammogram: 2015  Last colonoscopy: N/A  MEDICATIONS: Outpatient Encounter Medications as of 08/02/2020  Medication Sig  . Ascorbic Acid (VITAMIN C) 100 MG tablet Take 100 mg by mouth daily.  . cyanocobalamin 100 MCG tablet Take 100 mcg by mouth daily.  . diphenhydrAMINE (BENADRYL ALLERGY) 25 mg capsule Take 25 mg by mouth at bedtime as needed for allergies.  Marland Kitchen ibuprofen (ADVIL) 200 MG tablet Take 400 mg by mouth every 6 (six) hours as needed for mild pain.  Marland Kitchen ibuprofen (ADVIL) 800 MG tablet Take 1 tablet (800 mg total) by mouth every 8 (eight) hours as needed for moderate  pain. For AFTER surgery only  . Ibuprofen-diphenhydrAMINE Cit (ADVIL PM PO) Take by mouth.  Marland Kitchen ipratropium (ATROVENT) 0.03 % nasal spray Place 2 sprays into both nostrils every 12 (twelve) hours.  . Multiple Vitamin (MULTIVITAMIN) tablet Take 1 tablet by mouth daily.  Marland Kitchen senna-docusate (SENOKOT-S) 8.6-50 MG tablet Take 2 tablets by mouth at bedtime. Do not take if having diarrhea  . traMADol (ULTRAM) 50 MG tablet Take 1 tablet (50 mg total) by mouth every 6 (six) hours as needed for severe pain. For AFTER surgery, do not take and drive  . Vitamin D, Ergocalciferol, (DRISDOL) 1.25 MG (50000 UT) CAPS capsule Take one tablet wkly  . Biotin 10 MG CAPS Take 1 capsule by mouth daily. (Patient not taking: Reported on 08/02/2020)   No facility-administered encounter medications on file as of 08/02/2020.    ALLERGIES:  Allergies  Allergen Reactions  . Codeine Nausea Only  . Wellbutrin [Bupropion]      FAMILY HISTORY:  Family History  Problem Relation Age of Onset  . Cancer Brother        pancreatic  . Ovarian  cancer Neg Hx   . Uterine cancer Neg Hx   . Breast cancer Neg Hx   . Colon cancer Neg Hx      SOCIAL HISTORY:  Social Connections: Not on file    REVIEW OF SYSTEMS:  Pertinent positives include abdominal distention and abdominal pain Denies appetite changes, fevers, chills, fatigue, unexplained weight changes. Denies hearing loss, neck lumps or masses, mouth sores, ringing in ears or voice changes. Denies cough or wheezing.  Denies shortness of breath. Denies chest pain or palpitations. Denies leg swelling. Denies blood in stools, diarrhea, nausea, vomiting, or early satiety. Denies pain with intercourse, dysuria, frequency, hematuria or incontinence. Denies hot flashes, pelvic pain, vaginal bleeding or vaginal discharge.   Denies joint pain, back pain or muscle pain/cramps. Denies itching, rash, or wounds. Denies dizziness, headaches, numbness or seizures. Denies swollen lymph nodes or glands, denies easy bruising or bleeding. Denies anxiety, depression, confusion, or decreased concentration.  Physical Exam:  Vital Signs for this encounter:  Blood pressure 103/76, pulse 82, temperature (!) 96.8 F (36 C), temperature source Tympanic, resp. rate 20, weight 124 lb (56.2 kg), last menstrual period 07/02/2012, SpO2 99 %. Body mass index is 18.85 kg/m. General: Alert, oriented, no acute distress.  HEENT: Normocephalic, atraumatic. Sclera anicteric.  Chest: Clear to auscultation bilaterally. No wheezes, rhonchi, or rales. Cardiovascular: Regular rate and rhythm, no murmurs, rubs, or gallops.  Abdomen: Normoactive bowel sounds. Soft in the upper abdomen, firm fullness within the lower abdomen, felling much of the abdomen up to approximately the level of the umbilicus.  This mass is mobile side to side and nontender to palpation. No hepatosplenomegaly appreciated. No palpable fluid wave.  Extremities: Grossly normal range of motion. Warm, well perfused. No edema bilaterally.  Skin: No  rashes or lesions.  Lymphatics: No cervical, supraclavicular, or inguinal adenopathy.  GU:  Normal external female genitalia. No lesions. No discharge or bleeding.             Bladder/urethra:  No lesions or masses, some anterior prolapse.             Vagina: Mildly atrophic, no lesions or masses.             Cervix/uterus: Surgically absent.  Bimanual exam: There is a firm and relatively immobile mass filling the pelvis that does not appear to be firmly attached to either the  vagina or the rectum.  The mass itself is smooth and the patient has moderate tenderness with exam and palpation of the mass.             Rectal: Large, and mobile mass again palpated, no nodularity.  LABORATORY AND RADIOLOGIC DATA:  Outside medical records were reviewed to synthesize the above history, along with the history and physical obtained during the visit.   Lab Results  Component Value Date   WBC 7.2 07/30/2020   HGB 12.2 07/30/2020   HCT 37.2 07/30/2020   PLT 260 07/30/2020   GLUCOSE 96 07/30/2020   CHOL 214 (H) 03/10/2019   TRIG 166 (H) 03/10/2019   HDL 48 03/10/2019   LDLCALC 136 (H) 03/10/2019   ALT 9 07/30/2020   AST 15 07/30/2020   NA 137 07/30/2020   K 3.8 07/30/2020   CL 108 07/30/2020   CREATININE 0.48 07/30/2020   BUN 11 07/30/2020   CO2 22 07/30/2020   TSH 1.420 03/10/2019   HGBA1C 5.3 03/10/2019   CT A/P on 07/30/20: IMPRESSION: 1. Large hypervascular soft tissue mass occupies the pelvis, compressing the urinary bladder and GI structures. No definite connection of this mass is seen to the vaginal cuff. The ovaries are not identified. 2. Enlarged left adrenal gland. Given the presence of soft tissue mass in the pelvis, this may represent metastatic disease to the adrenal gland. 3. Further evaluation with MRI of the abdomen and pelvis may be considered. Surgical consultation is also advised. 4. Right lower pole nonobstructive nephrolithiasis. 5. Aortic atherosclerosis.

## 2020-08-02 NOTE — H&P (View-Only) (Signed)
GYNECOLOGIC ONCOLOGY NEW PATIENT CONSULTATION   Patient Name: Sheryl Pratt  Patient Age: 55 y.o. Date of Service: 08/02/20 Referring Provider: Charlann Lange PA  Primary Care Provider: Lorrene Reid, PA-C Consulting Provider: Jeral Pinch, MD   Assessment/Plan:  Postmenopausal patient with a complex pelvic mass.  I discussed in detail patient's recent visit to the emergency department as well as her CT findings and lab testing.  I reviewed the CT images with the patient and her husband and discussed abnormal findings.  She has a large, complex appearing pelvic mass without definitive identification of her bilateral adnexa.  While the origin of this mass is unknown, my suspicion is that it is arising from one of her adnexa.  While her normal tumor markers are reassuring, we discussed that, for instance in the case of ovarian cancer, CA-125 can be within normal limits and up to 50% of early stage ovarian cancer.  The only way to make a definitive diagnosis and to help relieve her significant symptom burden is to proceed with surgical resection.  Patient is a good candidate for surgery and wishes to move forward with scheduling surgical resection.  Given the size and weight of the mass, I am recommending open surgery.  I would plan to start with a diagnostic laparoscopy to assure that the origin of this mass is in fact from the pelvic organs.  If confirmed, then plan would be for an exploratory laparotomy, excision of the mass, bilateral salpingo-oophorectomy.  We will plan to send the mass for intraoperative frozen section.  If malignancy identified, then the patient understands that she may need additional surgery including peritoneal biopsies, lymphadenectomy, omentectomy, and any other indicated procedures.  We discussed the risks of surgery including but not limited to bleeding, need for blood transfusion, infection, damage to surrounding structures requiring repair, unseen damage requiring  future hospitalization or surgery, medical complications related to surgery and anesthesia (including VTE, stroke, heart attack, and death).  If lymphadenectomy is necessary, we also discussed the risk of lymphedema and development of a lymphocyst or lymphocele.  Surgery was tentatively scheduled for April 26.  Given her constipation and stool burden on CT scan, I am recommending that she start a combination laxative stool softener until surgery.  Perioperative instructions were reviewed with her today and postoperative prescription sent to her pharmacy.  We discussed the importance of decreasing tobacco use between now and surgery if she is able to.  A copy of this note was sent to the patient's referring provider.  65 minutes of total time was spent for this patient encounter, including preparation, face-to-face counseling with the patient and coordination of care, and documentation of the encounter.   Sheryl Pinch, MD  Division of Gynecologic Oncology  Department of Obstetrics and Gynecology  [facility]  ___________________________________________  Chief Complaint: Chief Complaint  Patient presents with  . Pelvic mass    History of Present Illness:  Sheryl Pratt is a 55 y.o. y.o. female who is seen in consultation at the request of Sheryl Pratt for an evaluation of newly diagnosed and symptomatic pelvic mass.  The patient's history is notable for a vaginal hysterectomy and modified McCall culdoplasty in 2014 in the setting of pelvic organ prolapse.  Pathology from that showed no evidence of hyperplasia, atypia, or malignancy.  Last summer, the patient noted increased urinary frequency.  More recently, about 4 weeks ago, she started noticing intermittent abdominal pain that she describes as sometimes being cramping in nature  and other times sharp.  She thought this was secondary to constipation, which she also developed around the same time.  She notes that  bowel movements are sometimes daily although sometimes she goes more than a day without a bowel movement.  She defecates only small amounts and often has significant pain with the bowel movement, noting relief as soon as she defecates.  Approximately 2 weeks ago, she has noticed increasing abdominal girth with a mass in her lower abdomen.  She endorses a good appetite without nausea or emesis.  She denies any early satiety or abdominal bloating.  She was seen recently in the emergency department for evaluation of right lower quadrant pain.  She underwent CT scan at that visit showing a complex pelvic mass.  Tumor markers at that time were within normal limits including CEA, CA 19-9, and CEA-125.  She had a primary care provider however between that person leaving the practice as well as the pandemic, she has not had frequent medical care the last couple of years.  She finally got into a new office and saw a physician's assistant last month.  She is scheduled for an annual exam in June.  Tumor markers: CEA 2.3 CA-125 34.9 CA 19-9 5  The patient was not pleasant garden with her husband, Sheryl Pratt.  Her son and daughter live in [place] and she spends a fair amount of time there.  She had been caring for her sister.  PAST MEDICAL HISTORY:  Past Medical History:  Diagnosis Date  . Allergy   . Breast mass, right      PAST SURGICAL HISTORY:  Past Surgical History:  Procedure Laterality Date  . APPENDECTOMY    . AUGMENTATION MAMMAPLASTY Bilateral   . BREAST BIOPSY Right 2014   fibroadenoma  . BREAST SURGERY  18 years ago   augmentation.  Marland Kitchen VAGINAL HYSTERECTOMY  2015    OB/GYN HISTORY:  OB History  Gravida Para Term Preterm AB Living  4 2     2     SAB IAB Ectopic Multiple Live Births  2            # Outcome Date GA Lbr Len/2nd Weight Sex Delivery Anes PTL Lv  4 SAB           3 SAB           2 Para           1 Para             Obstetric Comments  1st Menstrual Cycle:  12  1st  Pregnancy: 16    Patient's last menstrual period was 07/02/2012.  Age at menarche: 63 Age at menopause: Hysterectomy at age 70, denies significant hot flashes Hx of HRT: Denies Hx of STDs: Denies Last pap: Unsure, thinks before her hysterectomy History of abnormal pap smears: Denies  SCREENING STUDIES:  Last mammogram: 2015  Last colonoscopy: N/A  MEDICATIONS: Outpatient Encounter Medications as of 08/02/2020  Medication Sig  . Ascorbic Acid (VITAMIN C) 100 MG tablet Take 100 mg by mouth daily.  . cyanocobalamin 100 MCG tablet Take 100 mcg by mouth daily.  . diphenhydrAMINE (BENADRYL ALLERGY) 25 mg capsule Take 25 mg by mouth at bedtime as needed for allergies.  Marland Kitchen ibuprofen (ADVIL) 200 MG tablet Take 400 mg by mouth every 6 (six) hours as needed for mild pain.  Marland Kitchen ibuprofen (ADVIL) 800 MG tablet Take 1 tablet (800 mg total) by mouth every 8 (eight) hours as needed for moderate  pain. For AFTER surgery only  . Ibuprofen-diphenhydrAMINE Cit (ADVIL PM PO) Take by mouth.  Marland Kitchen ipratropium (ATROVENT) 0.03 % nasal spray Place 2 sprays into both nostrils every 12 (twelve) hours.  . Multiple Vitamin (MULTIVITAMIN) tablet Take 1 tablet by mouth daily.  Marland Kitchen senna-docusate (SENOKOT-S) 8.6-50 MG tablet Take 2 tablets by mouth at bedtime. Do not take if having diarrhea  . traMADol (ULTRAM) 50 MG tablet Take 1 tablet (50 mg total) by mouth every 6 (six) hours as needed for severe pain. For AFTER surgery, do not take and drive  . Vitamin D, Ergocalciferol, (DRISDOL) 1.25 MG (50000 UT) CAPS capsule Take one tablet wkly  . Biotin 10 MG CAPS Take 1 capsule by mouth daily. (Patient not taking: Reported on 08/02/2020)   No facility-administered encounter medications on file as of 08/02/2020.    ALLERGIES:  Allergies  Allergen Reactions  . Codeine Nausea Only  . Wellbutrin [Bupropion]      FAMILY HISTORY:  Family History  Problem Relation Age of Onset  . Cancer Brother        pancreatic  . Ovarian  cancer Neg Hx   . Uterine cancer Neg Hx   . Breast cancer Neg Hx   . Colon cancer Neg Hx      SOCIAL HISTORY:  Social Connections: Not on file    REVIEW OF SYSTEMS:  Pertinent positives include abdominal distention and abdominal pain Denies appetite changes, fevers, chills, fatigue, unexplained weight changes. Denies hearing loss, neck lumps or masses, mouth sores, ringing in ears or voice changes. Denies cough or wheezing.  Denies shortness of breath. Denies chest pain or palpitations. Denies leg swelling. Denies blood in stools, diarrhea, nausea, vomiting, or early satiety. Denies pain with intercourse, dysuria, frequency, hematuria or incontinence. Denies hot flashes, pelvic pain, vaginal bleeding or vaginal discharge.   Denies joint pain, back pain or muscle pain/cramps. Denies itching, rash, or wounds. Denies dizziness, headaches, numbness or seizures. Denies swollen lymph nodes or glands, denies easy bruising or bleeding. Denies anxiety, depression, confusion, or decreased concentration.  Physical Exam:  Vital Signs for this encounter:  Blood pressure 103/76, pulse 82, temperature (!) 96.8 F (36 C), temperature source Tympanic, resp. rate 20, weight 124 lb (56.2 kg), last menstrual period 07/02/2012, SpO2 99 %. Body mass index is 18.85 kg/m. General: Alert, oriented, no acute distress.  HEENT: Normocephalic, atraumatic. Sclera anicteric.  Chest: Clear to auscultation bilaterally. No wheezes, rhonchi, or rales. Cardiovascular: Regular rate and rhythm, no murmurs, rubs, or gallops.  Abdomen: Normoactive bowel sounds. Soft in the upper abdomen, firm fullness within the lower abdomen, felling much of the abdomen up to approximately the level of the umbilicus.  This mass is mobile side to side and nontender to palpation. No hepatosplenomegaly appreciated. No palpable fluid wave.  Extremities: Grossly normal range of motion. Warm, well perfused. No edema bilaterally.  Skin: No  rashes or lesions.  Lymphatics: No cervical, supraclavicular, or inguinal adenopathy.  GU:  Normal external female genitalia. No lesions. No discharge or bleeding.             Bladder/urethra:  No lesions or masses, some anterior prolapse.             Vagina: Mildly atrophic, no lesions or masses.             Cervix/uterus: Surgically absent.  Bimanual exam: There is a firm and relatively immobile mass filling the pelvis that does not appear to be firmly attached to either the  vagina or the rectum.  The mass itself is smooth and the patient has moderate tenderness with exam and palpation of the mass.             Rectal: Large, and mobile mass again palpated, no nodularity.  LABORATORY AND RADIOLOGIC DATA:  Outside medical records were reviewed to synthesize the above history, along with the history and physical obtained during the visit.   Lab Results  Component Value Date   WBC 7.2 07/30/2020   HGB 12.2 07/30/2020   HCT 37.2 07/30/2020   PLT 260 07/30/2020   GLUCOSE 96 07/30/2020   CHOL 214 (H) 03/10/2019   TRIG 166 (H) 03/10/2019   HDL 48 03/10/2019   LDLCALC 136 (H) 03/10/2019   ALT 9 07/30/2020   AST 15 07/30/2020   NA 137 07/30/2020   K 3.8 07/30/2020   CL 108 07/30/2020   CREATININE 0.48 07/30/2020   BUN 11 07/30/2020   CO2 22 07/30/2020   TSH 1.420 03/10/2019   HGBA1C 5.3 03/10/2019   CT A/P on 07/30/20: IMPRESSION: 1. Large hypervascular soft tissue mass occupies the pelvis, compressing the urinary bladder and GI structures. No definite connection of this mass is seen to the vaginal cuff. The ovaries are not identified. 2. Enlarged left adrenal gland. Given the presence of soft tissue mass in the pelvis, this may represent metastatic disease to the adrenal gland. 3. Further evaluation with MRI of the abdomen and pelvis may be considered. Surgical consultation is also advised. 4. Right lower pole nonobstructive nephrolithiasis. 5. Aortic atherosclerosis.

## 2020-08-02 NOTE — Progress Notes (Signed)
DUE TO COVID-19 ONLY ONE VISITOR IS ALLOWED TO COME WITH YOU AND STAY IN THE WAITING ROOM ONLY DURING PRE OP AND PROCEDURE DAY OF SURGERY. THE 1 VISITOR  MAY VISIT WITH YOU AFTER SURGERY IN YOUR PRIVATE ROOM DURING VISITING HOURS ONLY!  YOU NEED TO HAVE A COVID 19 TEST ON___4/22/22____ @_______ , THIS TEST MUST BE DONE BEFORE SURGERY,  COVID TESTING SITE [address] [place] [address], IT IS ON THE RIGHT GOING OUT [address] APPROXIMATELY  2 MINUTES PAST [employer] ON THE RIGHT. ONCE YOUR COVID TEST IS COMPLETED,  PLEASE BEGIN THE QUARANTINE INSTRUCTIONS AS OUTLINED IN YOUR HANDOUT.                Sheryl Pratt  08/02/2020   Your procedure is scheduled on:  08/16/20  Report to [facility] Main  Entrance   Report to admitting at    [place] AM     Call this number if you have problems the morning of surgery [phone]    Remember: Do not eat food , candy gum or mints :After Midnight. You may have clear liquids from midnight until 0430am   EAt a light diet the day before surgery.  Avoid gas producing foods.   CLEAR LIQUID DIET   Foods Allowed                                                                       Coffee and tea, regular and decaf                              Plain Jell-O any favor except red or purple                                            Fruit ices (not with fruit pulp)                                      Iced Popsicles                                     Carbonated beverages, regular and diet                                    Cranberry, grape and apple juices Sports drinks like Gatorade Lightly seasoned clear broth or consume(fat free) Sugar, honey syrup   _____________________________________________________________________    BRUSH YOUR TEETH MORNING OF SURGERY AND RINSE YOUR MOUTH OUT, NO CHEWING GUM CANDY OR MINTS.     Take these medicines the morning of surgery with A SIP OF WATER: none   DO NOT TAKE ANY DIABETIC  MEDICATIONS DAY OF YOUR SURGERY  You may not have any metal on your body including hair pins and              piercings  Do not wear jewelry, make-up, lotions, powders or perfumes, deodorant             Do not wear nail polish on your fingernails.  Do not shave  48 hours prior to surgery.              Men may shave face and neck.   Do not bring valuables to the hospital. [place].  Contacts, dentures or bridgework may not be worn into surgery.  Leave suitcase in the car. After surgery it may be brought to your room.     Patients discharged the day of surgery will not be allowed to drive home. IF YOU ARE HAVING SURGERY AND GOING HOME THE SAME DAY, YOU MUST HAVE AN ADULT TO DRIVE YOU HOME AND BE WITH YOU FOR 24 HOURS. YOU MAY GO HOME BY TAXI OR UBER OR ORTHERWISE, BUT AN ADULT MUST ACCOMPANY YOU HOME AND STAY WITH YOU FOR 24 HOURS.  Name and phone number of your driver:  Special Instructions: N/A              Please read over the following fact sheets you were given: _____________________________________________________________________  [facility] - Preparing for Surgery Before surgery, you can play an important role.  Because skin is not sterile, your skin needs to be as free of germs as possible.  You can reduce the number of germs on your skin by washing with CHG (chlorahexidine gluconate) soap before surgery.  CHG is an antiseptic cleaner which kills germs and bonds with the skin to continue killing germs even after washing. Please DO NOT use if you have an allergy to CHG or antibacterial soaps.  If your skin becomes reddened/irritated stop using the CHG and inform your nurse when you arrive at Short Stay. Do not shave (including legs and underarms) for at least 48 hours prior to the first CHG shower.  You may shave your face/neck. Please follow these instructions carefully:  1.  Shower with CHG Soap the night  before surgery and the  morning of Surgery.  2.  If you choose to wash your hair, wash your hair first as usual with your  normal  shampoo.  3.  After you shampoo, rinse your hair and body thoroughly to remove the  shampoo.                           4.  Use CHG as you would any other liquid soap.  You can apply chg directly  to the skin and wash                       Gently with a scrungie or clean washcloth.  5.  Apply the CHG Soap to your body ONLY FROM THE NECK DOWN.   Do not use on face/ open                           Wound or open sores. Avoid contact with eyes, ears mouth and genitals (private parts).  Wash face,  Genitals (private parts) with your normal soap.             6.  Wash thoroughly, paying special attention to the area where your surgery  will be performed.  7.  Thoroughly rinse your body with warm water from the neck down.  8.  DO NOT shower/wash with your normal soap after using and rinsing off  the CHG Soap.                9.  Pat yourself dry with a clean towel.            10.  Wear clean pajamas.            11.  Place clean sheets on your bed the night of your first shower and do not  sleep with pets. Day of Surgery : Do not apply any lotions/deodorants the morning of surgery.  Please wear clean clothes to the hospital/surgery center.  FAILURE TO FOLLOW THESE INSTRUCTIONS MAY RESULT IN THE CANCELLATION OF YOUR SURGERY PATIENT SIGNATURE_________________________________  NURSE SIGNATURE__________________________________  ________________________________________________________________________

## 2020-08-02 NOTE — Patient Instructions (Addendum)
Preparing for your Surgery  Plan for surgery on August 16, 2020 with Dr. Jeral Pinch at [place] will be scheduled for a diagnostic laparoscopy (looking in the abdomen with a camera), open bilateral salpingo-oophorectomy (removal of both ovaries and fallopian tubes), mass resection, possible staging if a cancer is identified.   Pre-operative Testing -You will receive a phone call from presurgical testing at [facility] to arrange for a pre-operative appointment, labs, and COVID test. The COVID test normally happens 3 days prior to the surgery and they ask that you self quarantine after the test up until surgery to decrease chance of exposure.  -Bring your insurance card, copy of an advanced directive if applicable, medication list  -At that visit, you will be asked to sign a consent for a possible blood transfusion in case a transfusion becomes necessary during surgery.  The need for a blood transfusion is rare but having consent is a necessary part of your care.     -You should not be taking blood thinners or aspirin at least ten days prior to surgery unless instructed by your surgeon.  -Do not take supplements such as fish oil (omega 3), red yeast rice, turmeric before your surgery. You want to avoid medications with aspirin in them including headache powders such as BC or Goody's), Excedrin migraine.  Day Before Surgery at [place] will be asked to take in a light diet the day before surgery. You will be advised you can have clear liquids up until 3 hours before your surgery.    Eat a light diet the day before surgery.  Examples including soups, broths, toast, yogurt, mashed potatoes.  AVOID GAS PRODUCING FOODS. Things to avoid include carbonated beverages (fizzy beverages, sodas), raw fruits and raw vegetables (uncooked), or beans.   If your bowels are filled with gas, your surgeon will have difficulty visualizing your pelvic organs which increases your surgical  risks.  Your role in recovery Your role is to become active as soon as directed by your doctor, while still giving yourself time to heal.  Rest when you feel tired. You will be asked to do the following in order to speed your recovery:  - Cough and breathe deeply. This helps to clear and expand your lungs and can prevent pneumonia after surgery.  - [place]. Do mild physical activity. Walking or moving your legs help your circulation and body functions return to normal. Do not try to get up or walk alone the first time after surgery.   -If you develop swelling on one leg or the other, pain in the back of your leg, redness/warmth in one of your legs, please call the office or go to the Emergency Room to have a doppler to rule out a blood clot. For shortness of breath, chest pain-seek care in the Emergency Room as soon as possible. - Actively manage your pain. Managing your pain lets you move in comfort. We will ask you to rate your pain on a scale of zero to 10. It is your responsibility to tell your doctor or nurse where and how much you hurt so your pain can be treated.  Special Considerations -If you are diabetic, you may be placed on insulin after surgery to have closer control over your blood sugars to promote healing and recovery.  This does not mean that you will be discharged on insulin.  If applicable, your oral antidiabetics will be resumed when you are tolerating  a solid diet.  -Your final pathology results from surgery should be available around one week after surgery and the results will be relayed to you when available.  -Dr. Lahoma Crocker is the surgeon that assists your GYN Oncologist with surgery.  If you end up staying the night, the next day after your surgery you will either see Dr. Denman George, Dr. Berline Lopes, or Dr. Lahoma Crocker.  -FMLA forms can be faxed to [phone] and please allow 5-7 business days for completion.  Pain Management After  Surgery -You have been prescribed your pain medication and bowel regimen medications before surgery so that you can have these available when you are discharged from the hospital. The pain medication is for use ONLY AFTER surgery and a new prescription will not be given.   -Make sure that you have Tylenol and Ibuprofen at home to use on a regular basis after surgery for pain control. We recommend alternating the medications every hour to six hours since they work differently and are processed in the body differently for pain relief.  -Review the attached handout on narcotic use and their risks and side effects.   Bowel Regimen -You have been prescribed Sennakot-S to take nightly to prevent constipation especially if you are taking the narcotic pain medication intermittently.  It is important to prevent constipation and drink adequate amounts of liquids. You can stop taking this medication when you are not taking pain medication and you are back on your normal bowel routine.  YOU CAN START TAKING THE SENNAKOT-S NOW SO YOUR BOWELS WILL BE CLEANED OUT FOR SURGERY.  Risks of Surgery Risks of surgery are low but include bleeding, infection, damage to surrounding structures, re-operation, blood clots, and very rarely death.   Blood Transfusion Information (For the consent to be signed before surgery)  We will be checking your blood type before surgery so in case of emergencies, we will know what type of blood you would need.                                            WHAT IS A BLOOD TRANSFUSION?  A transfusion is the replacement of blood or some of its parts. Blood is made up of multiple cells which provide different functions.  Red blood cells carry oxygen and are used for blood loss replacement.  White blood cells fight against infection.  Platelets control bleeding.  Plasma helps clot blood.  Other blood products are available for specialized needs, such as hemophilia or other clotting  disorders. BEFORE THE TRANSFUSION  Who gives blood for transfusions?   You may be able to donate blood to be used at a later date on yourself (autologous donation).  Relatives can be asked to donate blood. This is generally not any safer than if you have received blood from a stranger. The same precautions are taken to ensure safety when a relative's blood is donated.  Healthy volunteers who are fully evaluated to make sure their blood is safe. This is blood bank blood. Transfusion therapy is the safest it has ever been in the practice of medicine. Before blood is taken from a donor, a complete history is taken to make sure that person has no history of diseases nor engages in risky social behavior (examples are intravenous drug use or sexual activity with multiple partners). The donor's travel history is screened to minimize risk of  transmitting infections, such as malaria. The donated blood is tested for signs of infectious diseases, such as HIV and hepatitis. The blood is then tested to be sure it is compatible with you in order to minimize the chance of a transfusion reaction. If you or a relative donates blood, this is often done in anticipation of surgery and is not appropriate for emergency situations. It takes many days to process the donated blood. RISKS AND COMPLICATIONS Although transfusion therapy is very safe and saves many lives, the main dangers of transfusion include:   Getting an infectious disease.  Developing a transfusion reaction. This is an allergic reaction to something in the blood you were given. Every precaution is taken to prevent this. The decision to have a blood transfusion has been considered carefully by your caregiver before blood is given. Blood is not given unless the benefits outweigh the risks.  AFTER SURGERY INSTRUCTIONS  Return to work: 6 weeks if applicable  Activity: 1. Be up and out of the bed during the day.  Take a nap if needed.  You may walk up  steps but be careful and use the hand rail.  Stair climbing will tire you more than you think, you may need to stop part way and rest.   2. No lifting or straining for 6 weeks over 10 pounds. No pushing, pulling, straining for 6 weeks.  3. No driving for 1-2 week(s).  Do not drive if you are taking narcotic pain medicine and make sure that your reaction time has returned.   4. You can shower as soon as the next day after surgery. Shower daily.  Use your regular soap and water (not directly on the incision) and pat your incision(s) dry afterwards; don't rub.  No tub baths or submerging your body in water until cleared by your surgeon. If you have the soap that was given to you by pre-surgical testing that was used before surgery, you do not need to use it afterwards because this can irritate your incisions.   5. No sexual activity and nothing in the vagina for 4 weeks.  6. You may experience a small amount of clear drainage from your incisions, which is normal.  If the drainage persists, increases, or changes color please call the office.  7. Do not use creams, lotions, or ointments such as neosporin on your incisions after surgery until advised by your surgeon because they can cause removal of the dermabond glue on your incisions.    8. Take Tylenol or ibuprofen first for pain and only use narcotic pain medication for severe pain not relieved by the Tylenol or Ibuprofen.  Monitor your Tylenol intake to a max of 4,000 mg in a 24 hour period. You can alternate these medications after surgery.  Diet: 1. Low sodium Heart Healthy Diet is recommended but you are cleared to resume your normal (before surgery) diet after your procedure.  2. It is safe to use a laxative, such as Miralax or Colace, if you have difficulty moving your bowels. You have been prescribed Sennakot at bedtime every evening to keep bowel movements regular and to prevent constipation.    Wound Care: 1. Keep clean and dry.  Shower  daily.  Reasons to call the Doctor:  Fever - Oral temperature greater than 100.4 degrees Fahrenheit  Foul-smelling vaginal discharge  Difficulty urinating  Nausea and vomiting  Increased pain at the site of the incision that is unrelieved with pain medicine.  Difficulty breathing with or without  chest pain  New calf pain especially if only on one side  Sudden, continuing increased vaginal bleeding with or without clots.   Contacts: For questions or concerns you should contact:  Dr. Jeral Pinch at [phone]  Joylene John, NP at [phone]  After Hours: call [phone] and have the GYN Oncologist paged/contacted (after 5 pm or on the weekends).  Messages sent via mychart are for non-urgent matters and are not responded to after hours so for urgent needs, please call the after hours number.

## 2020-08-03 ENCOUNTER — Other Ambulatory Visit: Payer: Self-pay

## 2020-08-03 ENCOUNTER — Telehealth: Payer: Self-pay | Admitting: Gynecologic Oncology

## 2020-08-03 ENCOUNTER — Encounter (HOSPITAL_COMMUNITY): Payer: Self-pay | Admitting: Gynecologic Oncology

## 2020-08-03 NOTE — Telephone Encounter (Signed)
Called patient this am and offered her an earlier surgical date of April 19. She would like to take this date. I discussed that with her insurance, [place], they can take up to 15 days to authorize the surgery but we were going to try to submit it expedited and would keep her posted. No concerns voiced.

## 2020-08-03 NOTE — Progress Notes (Addendum)
COVID Vaccine Completed: yes Date COVID Vaccine completed: x2 Has received booster: Yes COVID vaccine manufacturer:    Moderna   Date of COVID positive in last 90 days:  PCP - Lorrene Reid, PA-C waiting to establish care 09/2020 Cardiologist - N/A  Chest x-ray - N/A EKG - N/A Stress Test - N/A ECHO - N/A Cardiac Cath - N/A Pacemaker/ICD device last checked: N/A  Sleep Study - N/A CPAP - N/A  Fasting Blood Sugar - N/A Checks Blood Sugar __N/A___ times a day  Blood Thinner Instructions: N/A Aspirin Instructions: N/A Last Dose: N/A  Activity level:  Can go up a flight of stairs and activities of daily living without stopping and without symptoms    Anesthesia review: N/A  Patient denies shortness of breath, fever, cough and chest pain at PAT appointment   Patient verbalized understanding of instructions that were given to them at the PAT appointment. Patient was also instructed that they will need to review over the PAT instructions again at home before surgery.

## 2020-08-05 ENCOUNTER — Other Ambulatory Visit (HOSPITAL_COMMUNITY)
Admission: RE | Admit: 2020-08-05 | Discharge: 2020-08-05 | Disposition: A | Discharge status: Home / Self Care | Payer: 59 | Source: Ambulatory Visit | Attending: Gynecologic Oncology | Admitting: Gynecologic Oncology

## 2020-08-05 ENCOUNTER — Telehealth: Payer: Self-pay | Admitting: *Deleted

## 2020-08-05 DIAGNOSIS — Z20822 Contact with and (suspected) exposure to covid-19: Secondary | ICD-10-CM | POA: Diagnosis not present

## 2020-08-05 DIAGNOSIS — Z01812 Encounter for preprocedural laboratory examination: Secondary | ICD-10-CM | POA: Insufficient documentation

## 2020-08-05 LAB — SARS CORONAVIRUS 2 (TAT 6-24 HRS): SARS Coronavirus 2: NEGATIVE

## 2020-08-05 NOTE — Telephone Encounter (Signed)
Returned the patient's call and explained "the pre op services will either call her and see her Monday or draw the labs on Tuesday. We did send pre admission services a message when we moved up her appt. After your COVID test today and can still leave home to come Monday to the hospital if needed for labs." Patient verbalized understanding

## 2020-08-08 ENCOUNTER — Telehealth: Payer: Self-pay

## 2020-08-08 NOTE — Telephone Encounter (Signed)
Sheryl Pratt states that she understands her written pr-op instructions. She is to arrive at Admitting at 1100. No clear liquids after 1000 tomorrow. No solid food after midnight. Her allergies have acted up this weekend.   She has been taking benadryl 25 mg in am and hs to dry post nasal drip. Told her that Joylene John, NP. said that she can take the benadryl tonight but not in the am as it could interfere with anesthesia. Pt verbalized understanding.

## 2020-08-09 ENCOUNTER — Inpatient Hospital Stay (HOSPITAL_COMMUNITY): Payer: 59 | Admitting: Certified Registered Nurse Anesthetist

## 2020-08-09 ENCOUNTER — Inpatient Hospital Stay (HOSPITAL_COMMUNITY)
Admission: RE | Admit: 2020-08-09 | Discharge: 2020-08-10 | DRG: 737 | Disposition: A | Discharge status: Home / Self Care | Payer: 59 | Attending: Gynecologic Oncology | Admitting: Gynecologic Oncology

## 2020-08-09 ENCOUNTER — Encounter (HOSPITAL_COMMUNITY)
Admission: RE | Disposition: A | Discharge status: Home / Self Care | Payer: Self-pay | Source: Home / Self Care | Attending: Gynecologic Oncology

## 2020-08-09 ENCOUNTER — Encounter (HOSPITAL_COMMUNITY): Payer: Self-pay | Admitting: Gynecologic Oncology

## 2020-08-09 ENCOUNTER — Other Ambulatory Visit: Payer: Self-pay

## 2020-08-09 ENCOUNTER — Inpatient Hospital Stay (HOSPITAL_COMMUNITY)
Admission: RE | Admit: 2020-08-09 | Discharge: 2020-08-09 | Disposition: A | Discharge status: Home / Self Care | Payer: 59 | Source: Ambulatory Visit

## 2020-08-09 DIAGNOSIS — Z7951 Long term (current) use of inhaled steroids: Secondary | ICD-10-CM | POA: Diagnosis not present

## 2020-08-09 DIAGNOSIS — Z888 Allergy status to other drugs, medicaments and biological substances status: Secondary | ICD-10-CM

## 2020-08-09 DIAGNOSIS — D62 Acute posthemorrhagic anemia: Secondary | ICD-10-CM | POA: Diagnosis not present

## 2020-08-09 DIAGNOSIS — Z90710 Acquired absence of both cervix and uterus: Secondary | ICD-10-CM | POA: Diagnosis not present

## 2020-08-09 DIAGNOSIS — Z79899 Other long term (current) drug therapy: Secondary | ICD-10-CM | POA: Diagnosis not present

## 2020-08-09 DIAGNOSIS — Z885 Allergy status to narcotic agent status: Secondary | ICD-10-CM | POA: Diagnosis not present

## 2020-08-09 DIAGNOSIS — R1900 Intra-abdominal and pelvic swelling, mass and lump, unspecified site: Secondary | ICD-10-CM | POA: Diagnosis present

## 2020-08-09 DIAGNOSIS — F17200 Nicotine dependence, unspecified, uncomplicated: Secondary | ICD-10-CM | POA: Diagnosis present

## 2020-08-09 DIAGNOSIS — C561 Malignant neoplasm of right ovary: Secondary | ICD-10-CM | POA: Diagnosis not present

## 2020-08-09 DIAGNOSIS — R1909 Other intra-abdominal and pelvic swelling, mass and lump: Secondary | ICD-10-CM | POA: Diagnosis present

## 2020-08-09 DIAGNOSIS — C7961 Secondary malignant neoplasm of right ovary: Principal | ICD-10-CM | POA: Diagnosis present

## 2020-08-09 DIAGNOSIS — N839 Noninflammatory disorder of ovary, fallopian tube and broad ligament, unspecified: Secondary | ICD-10-CM | POA: Diagnosis present

## 2020-08-09 DIAGNOSIS — Z780 Asymptomatic menopausal state: Secondary | ICD-10-CM

## 2020-08-09 HISTORY — DX: Vitamin D deficiency, unspecified: E55.9

## 2020-08-09 HISTORY — DX: Migraine, unspecified, not intractable, without status migrainosus: G43.909

## 2020-08-09 HISTORY — DX: Other seasonal allergic rhinitis: J30.2

## 2020-08-09 HISTORY — PX: OMENTECTOMY: SHX5985

## 2020-08-09 HISTORY — PX: LAPAROTOMY: SHX154

## 2020-08-09 HISTORY — DX: Intra-abdominal and pelvic swelling, mass and lump, unspecified site: R19.00

## 2020-08-09 HISTORY — DX: Abdominal distension (gaseous): R14.0

## 2020-08-09 HISTORY — DX: Unspecified osteoarthritis, unspecified site: M19.90

## 2020-08-09 HISTORY — DX: Constipation, unspecified: K59.00

## 2020-08-09 HISTORY — DX: Other specified postprocedural states: Z98.890

## 2020-08-09 HISTORY — PX: SALPINGOOPHORECTOMY: SHX82

## 2020-08-09 HISTORY — DX: Nausea with vomiting, unspecified: R11.2

## 2020-08-09 LAB — CBC
HCT: 35.8 % — ABNORMAL LOW (ref 36.0–46.0)
Hemoglobin: 11.7 g/dL — ABNORMAL LOW (ref 12.0–15.0)
MCH: 31.7 pg (ref 26.0–34.0)
MCHC: 32.7 g/dL (ref 30.0–36.0)
MCV: 97 fL (ref 80.0–100.0)
Platelets: 256 10*3/uL (ref 150–400)
RBC: 3.69 MIL/uL — ABNORMAL LOW (ref 3.87–5.11)
RDW: 12 % (ref 11.5–15.5)
WBC: 6.3 10*3/uL (ref 4.0–10.5)
nRBC: 0 % (ref 0.0–0.2)

## 2020-08-09 LAB — BASIC METABOLIC PANEL
Anion gap: 10 (ref 5–15)
BUN: 10 mg/dL (ref 6–20)
CO2: 21 mmol/L — ABNORMAL LOW (ref 22–32)
Calcium: 8.6 mg/dL — ABNORMAL LOW (ref 8.9–10.3)
Chloride: 107 mmol/L (ref 98–111)
Creatinine, Ser: 0.55 mg/dL (ref 0.44–1.00)
GFR, Estimated: >60 mL/min (ref >60)
Glucose, Bld: 85 mg/dL (ref 70–99)
Potassium: 4 mmol/L (ref 3.5–5.1)
Sodium: 138 mmol/L (ref 135–145)

## 2020-08-09 LAB — URINALYSIS, ROUTINE W REFLEX MICROSCOPIC
Bilirubin Urine: NEGATIVE
Glucose, UA: NEGATIVE mg/dL
Hgb urine dipstick: NEGATIVE
Ketones, ur: 20 mg/dL — AB
Leukocytes,Ua: NEGATIVE
Nitrite: NEGATIVE
Protein, ur: NEGATIVE mg/dL
Specific Gravity, Urine: 1.014 (ref 1.005–1.030)
pH: 5 (ref 5.0–8.0)

## 2020-08-09 LAB — ABO/RH: ABO/RH(D): O POS

## 2020-08-09 LAB — TYPE AND SCREEN
ABO/RH(D): O POS
Antibody Screen: NEGATIVE

## 2020-08-09 SURGERY — LAPAROTOMY, EXPLORATORY
Anesthesia: General | Laterality: Right

## 2020-08-09 MED ORDER — KCL IN DEXTROSE-NACL 20-5-0.45 MEQ/L-%-% IV SOLN
INTRAVENOUS | Status: DC
  Filled 2020-08-09 (×3): qty 1000

## 2020-08-09 MED ORDER — PHENYLEPHRINE 40 MCG/ML (10ML) SYRINGE FOR IV PUSH (FOR BLOOD PRESSURE SUPPORT)
PREFILLED_SYRINGE | INTRAVENOUS | Status: DC | PRN
  Administered 2020-08-09: 80 ug via INTRAVENOUS
  Administered 2020-08-09: 120 ug via INTRAVENOUS

## 2020-08-09 MED ORDER — ONDANSETRON HCL 4 MG/2ML IJ SOLN
INTRAMUSCULAR | Status: AC
  Filled 2020-08-09: qty 2

## 2020-08-09 MED ORDER — PHENYLEPHRINE 40 MCG/ML (10ML) SYRINGE FOR IV PUSH (FOR BLOOD PRESSURE SUPPORT)
PREFILLED_SYRINGE | INTRAVENOUS | Status: AC
  Filled 2020-08-09: qty 10

## 2020-08-09 MED ORDER — SCOPOLAMINE 1 MG/3DAYS TD PT72
1.0000 | MEDICATED_PATCH | TRANSDERMAL | Status: DC
Start: 2020-08-09 — End: 2020-08-09
  Administered 2020-08-09: 1.5 mg via TRANSDERMAL
  Filled 2020-08-09: qty 1

## 2020-08-09 MED ORDER — PREGABALIN 75 MG PO CAPS
75.0000 mg | ORAL_CAPSULE | Freq: Two times a day (BID) | ORAL | Status: DC
  Administered 2020-08-10: 75 mg via ORAL
  Filled 2020-08-09: qty 1

## 2020-08-09 MED ORDER — DEXAMETHASONE SODIUM PHOSPHATE 4 MG/ML IJ SOLN: 4.0000 mg | INTRAMUSCULAR | Status: DC

## 2020-08-09 MED ORDER — DEXAMETHASONE SODIUM PHOSPHATE 10 MG/ML IJ SOLN
INTRAMUSCULAR | Status: AC
  Filled 2020-08-09: qty 1

## 2020-08-09 MED ORDER — HYDROMORPHONE HCL 1 MG/ML IJ SOLN
0.2000 mg | INTRAMUSCULAR | Status: DC | PRN
  Administered 2020-08-09: 0.6 mg via INTRAVENOUS
  Filled 2020-08-09: qty 1

## 2020-08-09 MED ORDER — TRAMADOL HCL 50 MG PO TABS
100.0000 mg | ORAL_TABLET | Freq: Four times a day (QID) | ORAL | Status: DC
  Administered 2020-08-10 (×3): 100 mg via ORAL
  Filled 2020-08-09 (×4): qty 2

## 2020-08-09 MED ORDER — ENOXAPARIN SODIUM 40 MG/0.4ML ~~LOC~~ SOLN
40.0000 mg | SUBCUTANEOUS | Status: DC
  Administered 2020-08-10: 40 mg via SUBCUTANEOUS
  Filled 2020-08-09: qty 0.4

## 2020-08-09 MED ORDER — CEFAZOLIN SODIUM-DEXTROSE 2-4 GM/100ML-% IV SOLN
2.0000 g | INTRAVENOUS | Status: AC
Start: 2020-08-09 — End: 2020-08-09
  Administered 2020-08-09: 2 g via INTRAVENOUS
  Filled 2020-08-09: qty 100

## 2020-08-09 MED ORDER — ORAL CARE MOUTH RINSE: 15.0000 mL | Freq: Once | OROMUCOSAL | Status: AC

## 2020-08-09 MED ORDER — CHLORHEXIDINE GLUCONATE 0.12 % MT SOLN
15.0000 mL | Freq: Once | OROMUCOSAL | Status: AC
  Administered 2020-08-09: 15 mL via OROMUCOSAL

## 2020-08-09 MED ORDER — AMISULPRIDE (ANTIEMETIC) 5 MG/2ML IV SOLN: 10.0000 mg | Freq: Once | INTRAVENOUS | Status: DC | PRN

## 2020-08-09 MED ORDER — KETOROLAC TROMETHAMINE 30 MG/ML IJ SOLN
INTRAMUSCULAR | Status: AC
  Filled 2020-08-09: qty 1

## 2020-08-09 MED ORDER — LACTATED RINGERS IV SOLN: INTRAVENOUS | Status: DC

## 2020-08-09 MED ORDER — HEPARIN SODIUM (PORCINE) 5000 UNIT/ML IJ SOLN
5000.0000 [IU] | INTRAMUSCULAR | Status: AC
  Administered 2020-08-09: 5000 [IU] via SUBCUTANEOUS
  Filled 2020-08-09: qty 1

## 2020-08-09 MED ORDER — KETOROLAC TROMETHAMINE 30 MG/ML IJ SOLN
30.0000 mg | Freq: Four times a day (QID) | INTRAMUSCULAR | Status: DC
Start: 2020-08-09 — End: 2020-08-09
  Administered 2020-08-09: 30 mg via INTRAVENOUS

## 2020-08-09 MED ORDER — BUPIVACAINE HCL 0.25 % IJ SOLN
INTRAMUSCULAR | Status: DC | PRN
  Administered 2020-08-09: 36 mL

## 2020-08-09 MED ORDER — ONDANSETRON HCL 4 MG/2ML IJ SOLN
INTRAMUSCULAR | Status: DC | PRN
  Administered 2020-08-09 (×2): 4 mg via INTRAVENOUS

## 2020-08-09 MED ORDER — HYDROMORPHONE HCL 1 MG/ML IJ SOLN
INTRAMUSCULAR | Status: AC
  Filled 2020-08-09: qty 1

## 2020-08-09 MED ORDER — LIDOCAINE 2% (20 MG/ML) 5 ML SYRINGE
INTRAMUSCULAR | Status: DC | PRN
  Administered 2020-08-09: 60 mg via INTRAVENOUS
  Administered 2020-08-09: 40 mg via INTRAVENOUS

## 2020-08-09 MED ORDER — ACETAMINOPHEN 500 MG PO TABS
1000.0000 mg | ORAL_TABLET | ORAL | Status: AC
Start: 2020-08-09 — End: 2020-08-09
  Administered 2020-08-09: 1000 mg via ORAL
  Filled 2020-08-09: qty 2

## 2020-08-09 MED ORDER — ENSURE ENLIVE PO LIQD
237.0000 mL | Freq: Two times a day (BID) | ORAL | Status: DC
  Administered 2020-08-10: 237 mL via ORAL

## 2020-08-09 MED ORDER — ROCURONIUM BROMIDE 10 MG/ML (PF) SYRINGE
PREFILLED_SYRINGE | INTRAVENOUS | Status: AC
  Filled 2020-08-09: qty 10

## 2020-08-09 MED ORDER — DEXAMETHASONE SODIUM PHOSPHATE 10 MG/ML IJ SOLN
INTRAMUSCULAR | Status: DC | PRN
  Administered 2020-08-09: 10 mg via INTRAVENOUS

## 2020-08-09 MED ORDER — LIDOCAINE 2% (20 MG/ML) 5 ML SYRINGE
INTRAMUSCULAR | Status: AC
  Filled 2020-08-09: qty 5

## 2020-08-09 MED ORDER — PROPOFOL 10 MG/ML IV BOLUS
INTRAVENOUS | Status: DC | PRN
  Administered 2020-08-09 (×2): 50 mg via INTRAVENOUS
  Administered 2020-08-09: 150 mg via INTRAVENOUS
  Administered 2020-08-09: 50 mg via INTRAVENOUS

## 2020-08-09 MED ORDER — KETOROLAC TROMETHAMINE 15 MG/ML IJ SOLN
15.0000 mg | Freq: Four times a day (QID) | INTRAMUSCULAR | Status: AC
Start: 2020-08-10 — End: 2020-08-10
  Administered 2020-08-10 (×2): 15 mg via INTRAVENOUS
  Filled 2020-08-09 (×2): qty 1

## 2020-08-09 MED ORDER — IBUPROFEN 200 MG PO TABS
600.0000 mg | ORAL_TABLET | Freq: Four times a day (QID) | ORAL | Status: DC
  Administered 2020-08-10: 600 mg via ORAL
  Filled 2020-08-09: qty 3

## 2020-08-09 MED ORDER — HYDROMORPHONE HCL 1 MG/ML IJ SOLN
0.2500 mg | INTRAMUSCULAR | Status: DC | PRN
  Administered 2020-08-09 (×2): 0.5 mg via INTRAVENOUS

## 2020-08-09 MED ORDER — PROMETHAZINE HCL 25 MG/ML IJ SOLN: 6.2500 mg | INTRAMUSCULAR | Status: DC | PRN

## 2020-08-09 MED ORDER — ROCURONIUM BROMIDE 10 MG/ML (PF) SYRINGE
PREFILLED_SYRINGE | INTRAVENOUS | Status: DC | PRN
  Administered 2020-08-09: 20 mg via INTRAVENOUS
  Administered 2020-08-09: 50 mg via INTRAVENOUS

## 2020-08-09 MED ORDER — POVIDONE-IODINE 10 % EX SWAB: 2.0000 "application " | Freq: Once | CUTANEOUS | Status: DC

## 2020-08-09 MED ORDER — ONDANSETRON HCL 4 MG PO TABS
4.0000 mg | ORAL_TABLET | Freq: Four times a day (QID) | ORAL | Status: DC | PRN
Start: 2020-08-09 — End: 2020-08-10

## 2020-08-09 MED ORDER — SENNOSIDES-DOCUSATE SODIUM 8.6-50 MG PO TABS
2.0000 | ORAL_TABLET | Freq: Every day | ORAL | Status: DC
Start: 2020-08-09 — End: 2020-08-10
  Administered 2020-08-09: 2 via ORAL
  Filled 2020-08-09: qty 2

## 2020-08-09 MED ORDER — FENTANYL CITRATE (PF) 100 MCG/2ML IJ SOLN: 25.0000 ug | INTRAMUSCULAR | Status: DC | PRN

## 2020-08-09 MED ORDER — GABAPENTIN 300 MG PO CAPS
300.0000 mg | ORAL_CAPSULE | ORAL | Status: AC
Start: 2020-08-09 — End: 2020-08-09
  Administered 2020-08-09: 300 mg via ORAL
  Filled 2020-08-09: qty 1

## 2020-08-09 MED ORDER — ONDANSETRON HCL 4 MG/2ML IJ SOLN: 4.0000 mg | Freq: Four times a day (QID) | INTRAMUSCULAR | Status: DC | PRN

## 2020-08-09 MED ORDER — MIDAZOLAM HCL 2 MG/2ML IJ SOLN
INTRAMUSCULAR | Status: DC | PRN
  Administered 2020-08-09: 2 mg via INTRAVENOUS

## 2020-08-09 MED ORDER — CELECOXIB 200 MG PO CAPS
400.0000 mg | ORAL_CAPSULE | ORAL | Status: AC
  Administered 2020-08-09: 400 mg via ORAL
  Filled 2020-08-09: qty 2

## 2020-08-09 MED ORDER — MIDAZOLAM HCL 2 MG/2ML IJ SOLN
INTRAMUSCULAR | Status: AC
  Filled 2020-08-09: qty 2

## 2020-08-09 MED ORDER — FENTANYL CITRATE (PF) 100 MCG/2ML IJ SOLN
INTRAMUSCULAR | Status: DC | PRN
  Administered 2020-08-09: 100 ug via INTRAVENOUS
  Administered 2020-08-09: 50 ug via INTRAVENOUS

## 2020-08-09 MED ORDER — NON FORMULARY: 1.0000 [IU] | Freq: Three times a day (TID) | Status: DC

## 2020-08-09 MED ORDER — BUPIVACAINE HCL 0.25 % IJ SOLN
INTRAMUSCULAR | Status: AC
  Filled 2020-08-09: qty 1

## 2020-08-09 MED ORDER — SODIUM CHLORIDE (PF) 0.9 % IJ SOLN
INTRAMUSCULAR | Status: AC
  Filled 2020-08-09: qty 20

## 2020-08-09 MED ORDER — ACETAMINOPHEN 500 MG PO TABS
1000.0000 mg | ORAL_TABLET | Freq: Two times a day (BID) | ORAL | Status: DC
  Administered 2020-08-10: 1000 mg via ORAL
  Filled 2020-08-09: qty 2

## 2020-08-09 MED ORDER — ENOXAPARIN (LOVENOX) PATIENT EDUCATION KIT
PACK | Freq: Once | Status: AC
Start: 2020-08-10 — End: 2020-08-10
  Filled 2020-08-09: qty 1

## 2020-08-09 MED ORDER — CHEWING GUM (ORBIT) SUGAR FREE
1.0000 | CHEWING_GUM | Freq: Three times a day (TID) | ORAL | Status: DC
  Administered 2020-08-10 (×2): 1 via ORAL
  Filled 2020-08-09: qty 1

## 2020-08-09 MED ORDER — 0.9 % SODIUM CHLORIDE (POUR BTL) OPTIME
TOPICAL | Status: DC | PRN
  Administered 2020-08-09: 4000 mL

## 2020-08-09 MED ORDER — FENTANYL CITRATE (PF) 250 MCG/5ML IJ SOLN
INTRAMUSCULAR | Status: AC
  Filled 2020-08-09: qty 5

## 2020-08-09 MED ORDER — SUGAMMADEX SODIUM 200 MG/2ML IV SOLN
INTRAVENOUS | Status: DC | PRN
  Administered 2020-08-09: 200 mg via INTRAVENOUS

## 2020-08-09 SURGICAL SUPPLY — 84 items
ADH SKN CLS APL DERMABOND .7 (GAUZE/BANDAGES/DRESSINGS) ×6
AGENT HMST 10 BLLW SHRT CANN (HEMOSTASIS) ×3
AGENT HMST KT MTR STRL THRMB (HEMOSTASIS)
APL PRP STRL LF DISP 70% ISPRP (MISCELLANEOUS) ×3
ATTRACTOMAT 16X20 MAGNETIC DRP (DRAPES) ×1 IMPLANT
BACTOSHIELD CHG 4% 4OZ (MISCELLANEOUS)
BAG SPEC RTRVL LRG 6X4 10 (ENDOMECHANICALS)
BLADE EXTENDED COATED 6.5IN (ELECTRODE) ×4 IMPLANT
CABLE HIGH FREQUENCY MONO STRZ (ELECTRODE) IMPLANT
CELLS DAT CNTRL 66122 CELL SVR (MISCELLANEOUS) IMPLANT
CHLORAPREP W/TINT 26 (MISCELLANEOUS) ×4 IMPLANT
CLIP VESOCCLUDE LG 6/CT (CLIP) ×4 IMPLANT
CLIP VESOCCLUDE MED 6/CT (CLIP) ×4 IMPLANT
CLIP VESOCCLUDE MED LG 6/CT (CLIP) ×4 IMPLANT
CNTNR URN SCR LID CUP LEK RST (MISCELLANEOUS) IMPLANT
CONT SPEC 4OZ STRL OR WHT (MISCELLANEOUS)
COVER SURGICAL LIGHT HANDLE (MISCELLANEOUS) ×4 IMPLANT
COVER WAND RF STERILE (DRAPES) IMPLANT
DECANTER SPIKE VIAL GLASS SM (MISCELLANEOUS) ×1 IMPLANT
DERMABOND ADVANCED (GAUZE/BANDAGES/DRESSINGS) ×2
DERMABOND ADVANCED .7 DNX12 (GAUZE/BANDAGES/DRESSINGS) ×3 IMPLANT
DRAPE SURG IRRIG POUCH 19X23 (DRAPES) ×4 IMPLANT
DRAPE WARM FLUID 44X44 (DRAPES) ×4 IMPLANT
DRSG OPSITE POSTOP 4X10 (GAUZE/BANDAGES/DRESSINGS) ×1 IMPLANT
DRSG OPSITE POSTOP 4X6 (GAUZE/BANDAGES/DRESSINGS) IMPLANT
DRSG OPSITE POSTOP 4X8 (GAUZE/BANDAGES/DRESSINGS) IMPLANT
ELECT REM PT RETURN 15FT ADLT (MISCELLANEOUS) ×4 IMPLANT
GAUZE 4X4 16PLY RFD (DISPOSABLE) ×4 IMPLANT
GLOVE SURG ENC MOIS LTX SZ6 (GLOVE) ×8 IMPLANT
GLOVE SURG ENC MOIS LTX SZ6.5 (GLOVE) ×8 IMPLANT
GOWN STRL REUS W/ TWL LRG LVL3 (GOWN DISPOSABLE) ×6 IMPLANT
GOWN STRL REUS W/TWL LRG LVL3 (GOWN DISPOSABLE) ×8
HEMOSTAT ARISTA ABSORB 3G PWDR (HEMOSTASIS) IMPLANT
HEMOSTAT HEMOBLAST BELLOWS (HEMOSTASIS) ×1 IMPLANT
HOLDER FOLEY CATH W/STRAP (MISCELLANEOUS) ×1 IMPLANT
IRRIG SUCT STRYKERFLOW 2 WTIP (MISCELLANEOUS)
IRRIGATION SUCT STRKRFLW 2 WTP (MISCELLANEOUS) IMPLANT
KIT BASIN OR (CUSTOM PROCEDURE TRAY) ×4 IMPLANT
KIT TURNOVER KIT A (KITS) ×4 IMPLANT
LIGASURE IMPACT 36 18CM CVD LR (INSTRUMENTS) ×1 IMPLANT
LOOP VESSEL MAXI BLUE (MISCELLANEOUS) IMPLANT
MANIPULATOR UTERINE 4.5 ZUMI (MISCELLANEOUS) IMPLANT
NDL HYPO 21X1.5 SAFETY (NEEDLE) ×6 IMPLANT
NEEDLE HYPO 21X1.5 SAFETY (NEEDLE) ×8 IMPLANT
NS IRRIG 1000ML POUR BTL (IV SOLUTION) ×8 IMPLANT
PACK GENERAL/GYN (CUSTOM PROCEDURE TRAY) ×4 IMPLANT
PAD POSITIONING PINK XL (MISCELLANEOUS) ×3 IMPLANT
POUCH SPECIMEN RETRIEVAL 10MM (ENDOMECHANICALS) IMPLANT
RETRACTOR WND ALEXIS 18 MED (MISCELLANEOUS) IMPLANT
RETRACTOR WND ALEXIS 25 LRG (MISCELLANEOUS) IMPLANT
RTRCTR WOUND ALEXIS 18CM MED (MISCELLANEOUS)
RTRCTR WOUND ALEXIS 25CM LRG (MISCELLANEOUS) ×4
SCISSORS LAP 5X35 DISP (ENDOMECHANICALS) IMPLANT
SCRUB CHG 4% DYNA-HEX 4OZ (MISCELLANEOUS) ×3 IMPLANT
SEALER TISSUE G2 CVD JAW 45CM (ENDOMECHANICALS) IMPLANT
SHEET LAVH (DRAPES) ×4 IMPLANT
SLEEVE XCEL OPT CAN 5 100 (ENDOMECHANICALS) ×3 IMPLANT
SPONGE LAP 18X18 RF (DISPOSABLE) IMPLANT
SURGIFLO W/THROMBIN 8M KIT (HEMOSTASIS) IMPLANT
SUT MNCRL AB 4-0 PS2 18 (SUTURE) ×8 IMPLANT
SUT PDS AB 1 TP1 96 (SUTURE) ×8 IMPLANT
SUT VIC AB 2-0 CT1 27 (SUTURE)
SUT VIC AB 2-0 CT1 36 (SUTURE) ×8 IMPLANT
SUT VIC AB 2-0 CT1 TAPERPNT 27 (SUTURE) ×6 IMPLANT
SUT VIC AB 2-0 CT2 27 (SUTURE) ×18 IMPLANT
SUT VIC AB 2-0 SH 27 (SUTURE) ×12
SUT VIC AB 2-0 SH 27X BRD (SUTURE) IMPLANT
SUT VIC AB 3-0 CTX 36 (SUTURE) IMPLANT
SUT VIC AB 3-0 SH 18 (SUTURE) IMPLANT
SUT VIC AB 3-0 SH 27 (SUTURE) ×8
SUT VIC AB 3-0 SH 27X BRD (SUTURE) ×3 IMPLANT
SUT VIC AB 3-0 SH 27XBRD (SUTURE) IMPLANT
SUT VIC AB 4-0 PS2 27 (SUTURE) IMPLANT
SYR 30ML LL (SYRINGE) ×7 IMPLANT
SYS RETRIEVAL 5MM INZII UNIV (BASKET)
SYSTEM RETRIEVL 5MM INZII UNIV (BASKET) IMPLANT
TOWEL OR 17X26 10 PK STRL BLUE (TOWEL DISPOSABLE) ×4 IMPLANT
TOWEL OR NON WOVEN STRL DISP B (DISPOSABLE) ×4 IMPLANT
TRAY FOLEY MTR SLVR 16FR STAT (SET/KITS/TRAYS/PACK) ×4 IMPLANT
TRAY LAPAROSCOPIC (CUSTOM PROCEDURE TRAY) ×3 IMPLANT
TROCAR BLADELESS OPT 5 100 (ENDOMECHANICALS) ×3 IMPLANT
TROCAR XCEL 12X100 BLDLESS (ENDOMECHANICALS) IMPLANT
TROCAR XCEL BLUNT TIP 100MML (ENDOMECHANICALS) IMPLANT
UNDERPAD 30X36 HEAVY ABSORB (UNDERPADS AND DIAPERS) ×4 IMPLANT

## 2020-08-09 NOTE — Interval H&P Note (Signed)
History and Physical Interval Note:  08/09/2020 11:21 AM  [name]  has presented today for surgery, with the diagnosis of PELVIC MASS.  The various methods of treatment have been discussed with the patient and family. After consideration of risks, benefits and other options for treatment, the patient has consented to  Procedure(s): LAPAROSCOPY DIAGNOSTIC (N/A) OPEN SALPINGO OOPHORECTOMY, MASS RESECTION (Bilateral) LAPAROTOMY WITH STAGING (N/A) as a surgical intervention.  The patient's history has been reviewed, patient examined, no change in status, stable for surgery.  I have reviewed the patient's chart and labs.  Questions were answered to the patient's satisfaction.     Sheryl Pratt

## 2020-08-09 NOTE — Anesthesia Procedure Notes (Signed)
Procedure Name: Intubation Date/Time: 08/09/2020 1:28 PM Performed by: Genelle Bal, CRNA Pre-anesthesia Checklist: Patient identified, Emergency Drugs available, Suction available and Patient being monitored Patient Re-evaluated:Patient Re-evaluated prior to induction Oxygen Delivery Method: Circle system utilized Preoxygenation: Pre-oxygenation with 100% oxygen Induction Type: IV induction Ventilation: Mask ventilation without difficulty Laryngoscope Size: Miller and 2 Grade View: Grade I Tube type: Oral Tube size: 7.0 mm Number of attempts: 1 Airway Equipment and Method: Stylet and Oral airway Placement Confirmation: ETT inserted through vocal cords under direct vision,  positive ETCO2 and breath sounds checked- equal and bilateral Secured at: 20 cm Tube secured with: Tape Dental Injury: Teeth and Oropharynx as per pre-operative assessment

## 2020-08-09 NOTE — Transfer of Care (Signed)
Immediate Anesthesia Transfer of Care Note  Patient: Sheryl Pratt  Procedure(s) Performed: EXPLORATORY LAPAROTOMY, MASS EXCISION, PERITONEAL BIOPSIES OMENTECTOMY (N/A ) OPEN RIGHT SALPINGO OOPHORECTOMY (Right )  Patient Location: PACU  Anesthesia Type:General  Level of Consciousness: drowsy  Airway & Oxygen Therapy: Patient Spontanous Breathing and Patient connected to face mask oxygen  Post-op Assessment: Report given to RN and Post -op Vital signs reviewed and stable  Post vital signs: Reviewed and stable  Last Vitals:  Vitals Value Taken Time  BP    Temp    Pulse    Resp    SpO2      Last Pain:  Vitals:   08/09/20 1213  PainSc: 3       Patients Stated Pain Goal: 2 (16/43/53 9122)  Complications: No complications documented.

## 2020-08-09 NOTE — Anesthesia Preprocedure Evaluation (Addendum)
Anesthesia Evaluation  Patient identified by MRN, date of birth, ID band Patient awake    Reviewed: Allergy & Precautions, NPO status , Patient's Chart, lab work & pertinent test results  History of Anesthesia Complications (+) PONV and history of anesthetic complications  Airway Mallampati: II  TM Distance: >3 FB Neck ROM: Full    Dental  (+) Upper Dentures   Pulmonary Current Smoker and Patient abstained from smoking.,    Pulmonary exam normal breath sounds clear to auscultation       Cardiovascular Exercise Tolerance: Good Normal cardiovascular exam Rhythm:Regular Rate:Normal     Neuro/Psych  Headaches, negative psych ROS   GI/Hepatic negative GI ROS, Neg liver ROS,   Endo/Other  negative endocrine ROS  Renal/GU negative Renal ROS  negative genitourinary   Musculoskeletal  (+) Arthritis ,   Abdominal   Peds negative pediatric ROS (+)  Hematology negative hematology ROS (+)   Anesthesia Other Findings   Reproductive/Obstetrics negative OB ROS                            Anesthesia Physical Anesthesia Plan  ASA: II  Anesthesia Plan: General   Post-op Pain Management:    Induction: Intravenous  PONV Risk Score and Plan: 3 and Scopolamine patch - Pre-op, Treatment may vary due to age or medical condition, Midazolam, Dexamethasone and Ondansetron  Airway Management Planned: Oral ETT  Additional Equipment: None  Intra-op Plan:   Post-operative Plan: Extubation in OR  Informed Consent: I have reviewed the patients History and Physical, chart, labs and discussed the procedure including the risks, benefits and alternatives for the proposed anesthesia with the patient or authorized representative who has indicated his/her understanding and acceptance.       Plan Discussed with: CRNA, Anesthesiologist and Surgeon  Anesthesia Plan Comments:        Anesthesia Quick  Evaluation

## 2020-08-09 NOTE — Op Note (Signed)
Operative Report  Preoperative Diagnosis: adnexal mass  Postoperative Diagnosis: Right adnexal mass consistent with  Adenocarcinoma, mucinous, with signet ring features   Procedure(s) Performed: Exploratory laparotomy with right salpingo-oophorectomy, omentectomy washings, peritoneal biopsies, and omentectomy.  Surgeon: Valarie Cones, MD  Assistant Surgeon: Lahoma Crocker, MD  Specimens: Right adnexa, peritoneal biopsies, washings and omentum,biopsy from vaginal cuff (possible fallopian tube or ovarian remnant)   Estimated Blood Loss: 150 mL  Urine Output: 631 cc  Complications: None apparent  Operative Findings: A mobile solid right adnexal mass, 12-14cm, non-adherent to any surrounding structures. No evidence of intra-abdominal pelvic or peritoneal metastases, no intra-peritoneal rupture occurred. Surgically absent uterus/cervix. No identifiable left ovary or fallopian tube although what appeared to be possible tubal remnant was noted overlying the vaginal cuff (excised). Small bowel run from cecum to ligament of Treitz, no masses or nodularity noted. Liver edge and diaphragm smooth, no nodularity noted of the stomach, pancreas or omentum.  No adenopathy. Appendix surgically absent.  Frozen pathology was consistent with adenocarcinoma, mucinous, with signet ring features.   Procedure:   The patient was seen in the Holding Room. The risks, benefits, complications, treatment options, and expected outcomes were discussed with the patient.  The patient concurred with the proposed plan, giving informed consent.   The patient was  identified as Sheryl Pratt and the procedure verified as BSO with possible staging. A Time Out was held and the above information confirmed upon entry to the operating room.  After induction of anesthesia, the patient was draped and prepped in the usual sterile manner.  She was prepped and draped in the normal sterile fashion in the dorsal lithotomy position in padded  Allen stirrups with good attention paid to support of the lower back and lower extremities. Position was adjusted for appropriate support. A Foley catheter was placed to gravity.   A midline vertical incision was made and carried through the subcutaneous tissue to the fascia. The fascial incision was made and extended superiorally. The rectus muscles were separated. The peritoneum was identified and entered. Peritoneal incision was extended longitudinally.  The abdominal cavity was entered sharply and without incident. A survey of the abdomen was done with palpation. Peritoneal washings were collected. The right adnexal mass was delivery through the incision and the infundibulopelvic ligament and remaining mesosalpinx peritoneal attachment were clamped x2. The adnexa was freed from the pedicle sharply and handed off the field for frozen section. The pedicles were doubly suture ligated with 2-0 Vicryl.   A Bookwalter retractor was then placed. A survey of the abdomen and pelvis revealed the above findings. After packing the small bowel into the upper abdomen, the right sidewall was opened and the ureter identified along the medial leaf of the broad ligament.   The left sidewall was examined with no identifiable adnexal tissue noted. There ws what appeared to be a tubal remnant on the vaginal cuff. This was elevated and electrocautery was used to excise it.   When frozen section returned. Attention was turned to peritoneal biopsies. Peritoneal biopsies of the pelvis abdomen, cul de sac, colic gutters were collected. An infragastric omentectomy was performed using monopolar electrocautery and a Ligasure.  Hemostasis was assured.  The pelvis was irrigated copiously. Given raw surfaces along right pelvic sidewall, hemoblast was placed and pressure held for 3 minutes. Excellent hemostasis was observed.  The fascia was reapproximated with 0 looped PDS using a total of two sutures. The subcutaneous layer was  then irrigated copiously.  The subcutaneous layer  was reapproximated with interrupted 2-0 Vicryl after 0.25% marcaine was injected for local anesthesia. The subcutaneous layer was then reapproximated with a running 4-0 Monocryl. The patient tolerated the procedure well.   Sponge, lap and needle counts were correct x 2.   Foley catheter was removed.  Jeral Pinch MD Gynecologic Oncology

## 2020-08-09 NOTE — Brief Op Note (Signed)
08/09/2020  3:41 PM  PATIENT:  Sheryl Pratt  55 y.o. female  PRE-OPERATIVE DIAGNOSIS:  PELVIC MASS  POST-OPERATIVE DIAGNOSIS:  PELVIC MASS  PROCEDURE:  Procedure(s): EXPLORATORY LAPAROTOMY, MASS EXCISION, PERITONEAL BIOPSIES OMENTECTOMY (N/A) OPEN RIGHT SALPINGO OOPHORECTOMY (Right)  SURGEON:  Surgeon(s) and Role:    [name], MD - Primary    * Lahoma Crocker, MD - Assisting  ANESTHESIA:   general  EBL:  150cc  BLOOD ADMINISTERED:none  DRAINS: none   LOCAL MEDICATIONS USED:  MARCAINE     SPECIMEN:  Pelvic mass (right ovary  DISPOSITION OF SPECIMEN:  PATHOLOGY  COUNTS:  YES  TOURNIQUET:  * No tourniquets in log *  DICTATION: .Note written in EPIC  PLAN OF CARE: Admit to inpatient   PATIENT DISPOSITION:  PACU - hemodynamically stable.   Delay start of Pharmacological VTE agent (>24hrs) due to surgical blood loss or risk of bleeding: no

## 2020-08-10 ENCOUNTER — Encounter (HOSPITAL_COMMUNITY): Payer: Self-pay | Admitting: Gynecologic Oncology

## 2020-08-10 ENCOUNTER — Telehealth: Payer: Self-pay | Admitting: Oncology

## 2020-08-10 ENCOUNTER — Other Ambulatory Visit (HOSPITAL_COMMUNITY): Payer: Self-pay

## 2020-08-10 LAB — CBC
HCT: 29 % — ABNORMAL LOW (ref 36.0–46.0)
Hemoglobin: 9.6 g/dL — ABNORMAL LOW (ref 12.0–15.0)
MCH: 31.6 pg (ref 26.0–34.0)
MCHC: 33.1 g/dL (ref 30.0–36.0)
MCV: 95.4 fL (ref 80.0–100.0)
Platelets: 222 10*3/uL (ref 150–400)
RBC: 3.04 MIL/uL — ABNORMAL LOW (ref 3.87–5.11)
RDW: 11.9 % (ref 11.5–15.5)
WBC: 8.7 10*3/uL (ref 4.0–10.5)
nRBC: 0 % (ref 0.0–0.2)

## 2020-08-10 LAB — BASIC METABOLIC PANEL
Anion gap: 5 (ref 5–15)
BUN: 10 mg/dL (ref 6–20)
CO2: 23 mmol/L (ref 22–32)
Calcium: 7.8 mg/dL — ABNORMAL LOW (ref 8.9–10.3)
Chloride: 107 mmol/L (ref 98–111)
Creatinine, Ser: 0.56 mg/dL (ref 0.44–1.00)
GFR, Estimated: >60 mL/min (ref >60)
Glucose, Bld: 145 mg/dL — ABNORMAL HIGH (ref 70–99)
Potassium: 4.3 mmol/L (ref 3.5–5.1)
Sodium: 135 mmol/L (ref 135–145)

## 2020-08-10 MED ORDER — ENOXAPARIN SODIUM 40 MG/0.4ML ~~LOC~~ SOLN
40.0000 mg | SUBCUTANEOUS | 0 refills | Status: DC
  Filled 2020-08-10: qty 10.8, 27d supply, fill #0

## 2020-08-10 MED ORDER — ENOXAPARIN SODIUM 40 MG/0.4ML ~~LOC~~ SOLN
40.0000 mg | SUBCUTANEOUS | 0 refills | Status: DC
Start: 2020-08-11 — End: 2020-09-27

## 2020-08-10 MED ORDER — ENOXAPARIN SODIUM 40 MG/0.4ML ~~LOC~~ SOLN
40.0000 mg | SUBCUTANEOUS | 0 refills | Status: DC
Start: 2020-08-11 — End: 2020-08-10

## 2020-08-10 NOTE — Progress Notes (Signed)
1 Day Post-Op Procedure(s) (LRB): EXPLORATORY LAPAROTOMY, MASS EXCISION, PERITONEAL BIOPSIES OMENTECTOMY (N/A) OPEN RIGHT SALPINGO OOPHORECTOMY (Right)  Subjective: Patient reports did not sleep last night.  Had some pain associated with her incision.  Has been tolerating fluids without nausea or emesis.  Had a small amount of solid food this morning at breakfast.  Is ambulating without difficulty or dizziness to the bathroom.  Voided multiple times overnight.  Denies any flatus.  Objective: Vital signs in last 24 hours: Temp:  [97.9 F (36.6 C)-99.6 F (37.6 C)] 97.9 F (36.6 C) (04/20 0958) Pulse Rate:  [61-87] 62 (04/20 0958) Resp:  [15-22] 15 (04/20 0533) BP: (90-163)/(53-91) 101/65 (04/20 0958) SpO2:  [94 %-100 %] 99 % (04/20 0958) Weight:  [123 lb 14.4 oz (56.2 kg)] 123 lb 14.4 oz (56.2 kg) (04/19 1800) Last BM Date: 08/09/20  Intake/Output from previous day: 04/19 0701 - 04/20 0700 In: 3923.3 [P.O.:720; I.V.:3103.3; IV Piggyback:100] Out: 925 [Urine:850; Blood:75]  Physical Examination: General: No acute distress Cardiovascular: Regular rate and rhythm, no murmurs rubs or gallops appreciated Pulmonary: Lungs are clear to auscultation bilaterally, no wheezes or rhonchi appreciated Abdomen: Incision is clean dry and intact with Dermabond in place as well as honeycomb dressing.  Abdomen is appropriately tender to palpation, nondistended, mildly tympanitic.  Mildly hypoactive bowel sounds. Extremities: SCDs in place, warm and well perfused, no calf tenderness or significant edema.  Labs: WBC/Hgb/Hct/Plts:  8.7/9.6/29.0/222 (04/20 0419) BUN/Cr/glu/ALT/AST/amyl/lip:  10/0.56/--/--/--/--/-- (04/20 0419)  Assessment:  55 y.o. s/p Procedure(s): EXPLORATORY LAPAROTOMY, MASS EXCISION, PERITONEAL BIOPSIES OMENTECTOMY OPEN RIGHT SALPINGO OOPHORECTOMY: stable and progressing well  Patient is doing well from postoperative standpoint.  We discussed continued oral pain medications  both in the hospital and at home.  The patient would very much like to be discharged from the hospital as soon as she is ready.  We discussed early milestones that she must meet.  I have encouraged her to be up moving around today and to have her advance her diet as tolerated.  If she is able to do these things with adequate pain control and has at least some flatus, she could potentially go home later today.  Otherwise, we are likely looking at discharge for tomorrow.  She has acute anemia secondary to blood loss.  Estimated blood loss from the surgery was likely somewhat less because she had some back bleeding during removal of the ovarian mass.  Additionally, she probably has some dilutional effect given IV fluids that she received during and after surgery.  She has no symptoms concerning for acute anemia.  She also made excellent urine overnight.  We discussed frozen pathology from surgery.  She understands that this may be a primary ovary process or could be metastatic cancer from another source, most likely GI tract.  We will await final pathology and stains to help guide what other procedure she may need.  If this looks like it may be a metastatic process from her GI tract, then she will need to be set up for endoscopy and colonoscopy.  Plan: Advance diet, await return of bowel function The patient is to be discharged to home.   LOS: 1 day    [name] 08/10/2020, 12:51 PM

## 2020-08-10 NOTE — Discharge Instructions (Addendum)
08/09/2020  You will need to give yourself once daily lovenox injections in the abdomen for the next 27 days to prevent blood clots. Try to give the injection at the same time each day. THE PRESCRIPTION FOR THIS WAS SENT TO [facility]. DISREGARD THE MESSAGE STATING IT IS AT  OUTPATIENT PHARMACY.  You will have a white honeycomb dressing over your larger incision. This dressing can be removed 5 days after surgery and you do not need to reapply a new dressing. Once you remove the dressing, you will notice that you have the surgical glue (dermabond) on the incision and this will peel off on its own. You can get this dressing wet in the shower the days after surgery prior to removal on the 5th day.  Activity: 1. Be up and out of the bed during the day.  Take a nap if needed.  You may walk up steps but be careful and use the hand rail.  Stair climbing will tire you more than you think, you may need to stop part way and rest.   2. No lifting or straining for 6 weeks.  3. No driving until you are off narcotics and feel that you can brake safely. For most people, this is 1-2 weeks after surgery.  4. Shower daily.  Use soap and water on your incision and pat dry; don't rub.   5. No sexual activity and nothing in the vagina for 8 weeks.  Medications:  - Take ibuprofen and tylenol first line for pain control. Take these regularly (every 6 hours) to decrease the build up of pain.  - If necessary, for severe pain not relieved by ibuprofen, take tramadol.  - While taking tramadol you should take sennakot every night to reduce the likelihood of constipation. If this causes diarrhea, stop its use.  Diet: 1. Low sodium Heart Healthy Diet is recommended.  2. It is safe to use a laxative if you have difficulty moving your bowels.   Wound Care: 1. Keep clean and dry.  Shower daily.  Reasons to call the Doctor:   Fever - Oral temperature greater than 100.4 degrees  Fahrenheit  Foul-smelling vaginal discharge  Difficulty urinating  Nausea and vomiting  Increased pain at the site of the incision that is unrelieved with pain medicine.  Difficulty breathing with or without chest pain  New calf pain especially if only on one side  Sudden, continuing increased vaginal bleeding with or without clots.   Follow-up: 1. You have a phone visit on 5/4 with Dr. Berline Lopes to review final pathology. You will see her in clinic for follow-up on 5/24.  Contacts: For questions or concerns you should contact:  Dr. Jeral Pinch at [phone]  After hours and on week-ends call [phone] and ask to speak to the physician on call for Gynecologic Oncology  Enoxaparin injection What is this medicine? ENOXAPARIN (ee nox a PA rin) is used after knee, hip, or abdominal surgeries to prevent blood clotting. It is also used to treat existing blood clots in the lungs or in the veins. This medicine may be used for other purposes; ask your health care provider or pharmacist if you have questions. COMMON BRAND NAME(S): Lovenox What should I tell my health care provider before I take this medicine? They need to know if you have any of these conditions:  bleeding disorders, hemorrhage, or hemophilia  infection of the heart or heart valves  kidney or liver disease  previous stroke  prosthetic  heart valve  recent surgery or delivery of a baby  ulcer in the stomach or intestine, diverticulitis, or other bowel disease  an unusual or allergic reaction to enoxaparin, heparin, pork or pork products, other medicines, foods, dyes, or preservatives  pregnant or trying to get pregnant  breast-feeding How should I use this medicine? This medicine is for injection under the skin. It is usually given by a health-care professional. You or a family member may be trained on how to give the injections. If you are to give yourself injections, make sure you understand how to  use the syringe, measure the dose if necessary, and give the injection. To avoid bruising, do not rub the site where this medicine has been injected. Do not take your medicine more often than directed. Do not stop taking except on the advice of your doctor or health care professional. Make sure you receive a puncture-resistant container to dispose of the needles and syringes once you have finished with them. Do not reuse these items. Return the container to your doctor or health care professional for proper disposal. Talk to your pediatrician regarding the use of this medicine in children. Special care may be needed. Overdosage: If you think you have taken too much of this medicine contact a poison control center or emergency room at once. NOTE: This medicine is only for you. Do not share this medicine with others. What if I miss a dose? If you miss a dose, take it as soon as you can. If it is almost time for your next dose, take only that dose. Do not take double or extra doses. What may interact with this medicine?  aspirin and aspirin-like medicines  certain medicines that treat or prevent blood clots  dipyridamole  NSAIDs, medicines for pain and inflammation, like ibuprofen or naproxen This list may not describe all possible interactions. Give your health care provider a list of all the medicines, herbs, non-prescription drugs, or dietary supplements you use. Also tell them if you smoke, drink alcohol, or use illegal drugs. Some items may interact with your medicine. What should I watch for while using this medicine? Visit your healthcare professional for regular checks on your progress. You may need blood work done while you are taking this medicine. Your condition will be monitored carefully while you are receiving this medicine. It is important not to miss any appointments. If you are going to need surgery or other procedure, tell your healthcare professional that you are using this  medicine. Using this medicine for a long time may weaken your bones and increase the risk of bone fractures. Avoid sports and activities that might cause injury while you are using this medicine. Severe falls or injuries can cause unseen bleeding. Be careful when using sharp tools or knives. Consider using an Copy. Take special care brushing or flossing your teeth. Report any injuries, bruising, or red spots on the skin to your healthcare professional. Wear a medical ID bracelet or chain. Carry a card that describes your disease and details of your medicine and dosage times. What side effects may I notice from receiving this medicine? Side effects that you should report to your doctor or health care professional as soon as possible:  allergic reactions like skin rash, itching or hives, swelling of the face, lips, or tongue  bone pain  signs and symptoms of bleeding such as bloody or black, tarry stools; red or dark-Koci urine; spitting up blood or Groninger material that looks like coffee  grounds; red spots on the skin; unusual bruising or bleeding from the eye, gums, or nose  signs and symptoms of a blood clot such as chest pain; shortness of breath; pain, swelling, or warmth in the leg  signs and symptoms of a stroke such as changes in vision; confusion; trouble speaking or understanding; severe headaches; sudden numbness or weakness of the face, arm or leg; trouble walking; dizziness; loss of coordination Side effects that usually do not require medical attention (report to your doctor or health care professional if they continue or are bothersome):  hair loss  pain, redness, or irritation at site where injected This list may not describe all possible side effects. Call your doctor for medical advice about side effects. You may report side effects to FDA at 1-800-FDA-1088. Where should I keep my medicine? Keep out of the reach of children. Store at room temperature between 15 and 30  degrees C (59 and 86 degrees F). Do not freeze. If your injections have been specially prepared, you may need to store them in the refrigerator. Ask your pharmacist. Throw away any unused medicine after the expiration date. NOTE: This sheet is a summary. It may not cover all possible information. If you have questions about this medicine, talk to your doctor, pharmacist, or health care provider.  2021 Elsevier/Gold Standard (2017-04-04 11:25:34)

## 2020-08-10 NOTE — Discharge Summary (Addendum)
Physician Discharge Summary  Patient ID: Sheryl Pratt MRN: [id number] DOB/AGE: [birth year] 55 y.o.  Admit date: 08/09/2020 Discharge date: 08/10/2020  Admission Diagnoses: Pelvic mass in female  Discharge Diagnoses:  Principal Problem:   Pelvic mass in female   Discharged Condition:  The patient is in good condition and stable for discharge.   Hospital Course: On 08/09/2020, the patient underwent the following: Procedure(s): EXPLORATORY LAPAROTOMY, MASS EXCISION, PERITONEAL BIOPSIES, OMENTECTOMY, OPEN RIGHT SALPINGO OOPHORECTOMY. The postoperative course was uneventful.  She was discharged to home on postoperative day 1 tolerating a regular diet, pain controlled on PO medications, ambulating, voiding without difficulty. Patient voicing desire to leave today. Advised potentially another night would be warranted until flatus but patient wanting to go home today.  Consults: None  Significant Diagnostic Studies: Post-operative labs (stable)  Treatments: surgery: see above  Discharge Exam (AM exam per Dr. Berline Lopes): Blood pressure 103/74, pulse 73, temperature 98 F (36.7 C), temperature source Oral, resp. rate 15, height 5\' 8"  (1.727 m), weight 123 lb 14.4 oz (56.2 kg), last menstrual period 07/02/2012, SpO2 98 %. General: No acute distress Cardiovascular: Regular rate and rhythm, no murmurs rubs or gallops appreciated Pulmonary: Lungs are clear to auscultation bilaterally, no wheezes or rhonchi appreciated Abdomen: Incision is clean dry and intact with Dermabond in place as well as honeycomb dressing.  Abdomen is appropriately tender to palpation, nondistended, mildly tympanitic.  Mildly hypoactive bowel sounds. Extremities: SCDs in place, warm and well perfused, no calf tenderness or significant edema  Disposition: Discharge disposition: 01-Home or Self Care     Home  Discharge Instructions    Call MD for:  difficulty breathing, headache or visual disturbances   Complete by: As  directed    Call MD for:  extreme fatigue   Complete by: As directed    Call MD for:  hives   Complete by: As directed    Call MD for:  persistant dizziness or light-headedness   Complete by: As directed    Call MD for:  persistant nausea and vomiting   Complete by: As directed    Call MD for:  redness, tenderness, or signs of infection (pain, swelling, redness, odor or green/yellow discharge around incision site)   Complete by: As directed    Call MD for:  severe uncontrolled pain   Complete by: As directed    Call MD for:  temperature >100.4   Complete by: As directed    Diet - low sodium heart healthy   Complete by: As directed    Discharge wound care:   Complete by: As directed    You will have a white honeycomb dressing over your larger incision. This dressing can be removed 5 days after surgery and you do not need to reapply a new dressing. Once you remove the dressing, you will notice that you have the surgical glue (dermabond) on the incision and this will peel off on its own. You can get this dressing wet in the shower the days after surgery prior to removal on the 5th day.   Driving Restrictions   Complete by: As directed    No driving for around 1-2 weeks.  Do not take narcotics and drive.   Increase activity slowly   Complete by: As directed    Lifting restrictions   Complete by: As directed    No lifting greater than 10 lbs.   Sexual Activity Restrictions   Complete by: As directed    No sexual activity, nothing in  the vagina, for 8 weeks.     Allergies as of 08/10/2020      Reactions   Codeine Nausea Only   Wellbutrin [bupropion]    insomnia      Medication List    TAKE these medications   cholecalciferol 25 MCG (1000 UNIT) tablet Commonly known as: VITAMIN D3 Take 1,000 Units by mouth daily.   diphenhydrAMINE 25 mg capsule Commonly known as: BENADRYL Take 25 mg by mouth at bedtime as needed for allergies.   enoxaparin 40 MG/0.4ML injection Commonly  known as: LOVENOX Inject 0.4 mLs (40 mg total) into the skin daily for 27 days. Start taking on: August 11, 2020   ibuprofen 800 MG tablet Commonly known as: ADVIL Take 1 tablet (800 mg total) by mouth every 8 (eight) hours as needed for moderate pain. For AFTER surgery only What changed: Another medication with the same name was removed. Continue taking this medication, and follow the directions you see here.   Ibuprofen PM 200-25 MG Caps Generic drug: Ibuprofen-diphenhydrAMINE HCl Take 1 tablet by mouth at bedtime as needed (sleep).   ipratropium 0.03 % nasal spray Commonly known as: ATROVENT Place 2 sprays into both nostrils every 12 (twelve) hours. What changed:   when to take this  reasons to take this   METAMUCIL PO Take 1 Dose by mouth daily.   multivitamin tablet Take 1 tablet by mouth daily.   senna-docusate 8.6-50 MG tablet Commonly known as: Senokot-S Take 2 tablets by mouth at bedtime. Do not take if having diarrhea What changed: when to take this   traMADol 50 MG tablet Commonly known as: ULTRAM Take 1 tablet (50 mg total) by mouth every 6 (six) hours as needed for severe pain. For AFTER surgery, do not take and drive   VISINE OP Place 1 drop into both eyes daily as needed (itching /redness).   vitamin B-12 1000 MCG tablet Commonly known as: CYANOCOBALAMIN Take 1,000 mcg by mouth daily.   vitamin C 500 MG tablet Commonly known as: ASCORBIC ACID Take 500 mg by mouth daily.   Vitamin D (Ergocalciferol) 1.25 MG (50000 UNIT) Caps capsule Commonly known as: DRISDOL Take one tablet wkly   vitamin E 180 MG (400 UNITS) capsule Take 400 Units by mouth daily.            Discharge Care Instructions  (From admission, onward)         Start     Ordered   08/10/20 0000  Discharge wound care:       Comments: You will have a white honeycomb dressing over your larger incision. This dressing can be removed 5 days after surgery and you do not need to reapply  a new dressing. Once you remove the dressing, you will notice that you have the surgical glue (dermabond) on the incision and this will peel off on its own. You can get this dressing wet in the shower the days after surgery prior to removal on the 5th day.   08/10/20 1630          Follow-up Information    [name], MD Follow up on 08/24/2020.   Specialty: Gynecologic Oncology Why: at 3:30pm will be a PHONE visit to discuss pathology results. IN PERSON visit will be on 09/13/2020 at 1:45pm at the [facility] information: [address] [place] [address] [phone]               Greater than thirty minutes were spend  for face to face discharge instructions and discharge orders/summary in EPIC.   Signed: Dorothyann Gibbs 08/10/2020, 4:32 PM

## 2020-08-10 NOTE — Telephone Encounter (Signed)
Called [facility] and canceled Lovenox prescription.  Also called [facility] and they have the prescription and will fill it tomorrow.

## 2020-08-10 NOTE — Plan of Care (Signed)
  Problem: Clinical Measurements: Goal: Will remain free from infection Outcome: Progressing   Problem: Activity: Goal: Risk for activity intolerance will decrease Outcome: Progressing   Problem: Elimination: Goal: Will not experience complications related to bowel motility Outcome: Progressing

## 2020-08-10 NOTE — Telephone Encounter (Signed)
Called Sheryl Pratt and advised her that the copay for Lovenox will be $85.19 at the [facility].  She said she would like to have it sent to [facility] which is closer to her house since she does not have a way to pay the copay with her.  Advised her to plan on picking it up at [facility].

## 2020-08-11 ENCOUNTER — Telehealth: Payer: Self-pay

## 2020-08-11 ENCOUNTER — Telehealth: Payer: Self-pay | Admitting: Oncology

## 2020-08-11 ENCOUNTER — Other Ambulatory Visit: Payer: Self-pay | Admitting: Gynecologic Oncology

## 2020-08-11 DIAGNOSIS — R1900 Intra-abdominal and pelvic swelling, mass and lump, unspecified site: Secondary | ICD-10-CM

## 2020-08-11 MED ORDER — OXYCODONE HCL 5 MG PO TABS: 5.0000 mg | ORAL_TABLET | ORAL | 0 refills | Status: DC | PRN

## 2020-08-11 NOTE — Telephone Encounter (Signed)
Called Claire back and advised her that Oxycodone is being sent in to her pharmacy and to only take it for severe pain.  Also advised her not to take it with the Tramadol.  Discussed that it can also cause constipation. She said she can feel gas in her stomach and is belching.  She has not passed any gas.  Advised her to keep taking the Senakot and to use Miralax once in the morning and at bedtime.  Told her to stop taking metamucil.  Also discussed that if she does not feel like eating, not to force it and to call us if she starts vomiting or her abdomen feels distended.  She also mentioned that she feels dehydrated and her mouth is very dry.  She has been drinking a lot of water and is having to urinate frequently.  Advised her that I will call to check on her this afternoon.

## 2020-08-11 NOTE — Progress Notes (Signed)
See RN note. Patient reporting pain not relieved by tramadol.

## 2020-08-11 NOTE — Anesthesia Postprocedure Evaluation (Signed)
Anesthesia Post Note  Patient: TEILA SKALSKY  Procedure(s) Performed: EXPLORATORY LAPAROTOMY, MASS EXCISION, PERITONEAL BIOPSIES OMENTECTOMY (N/A ) OPEN RIGHT SALPINGO OOPHORECTOMY (Right )     Patient location during evaluation: PACU Anesthesia Type: General Level of consciousness: awake and alert Pain management: pain level controlled Vital Signs Assessment: post-procedure vital signs reviewed and stable Respiratory status: spontaneous breathing, nonlabored ventilation and respiratory function stable Cardiovascular status: blood pressure returned to baseline and stable Postop Assessment: no apparent nausea or vomiting Anesthetic complications: no   No complications documented.  Last Vitals:  Vitals:   08/10/20 0958 08/10/20 1339  BP: 101/65 103/74  Pulse: 62 73  Resp:    Temp: 36.6 C 36.7 C  SpO2: 99% 98%    Last Pain:  Vitals:   08/10/20 1339  TempSrc: Oral  PainSc:                  Merlinda Frederick

## 2020-08-11 NOTE — Telephone Encounter (Signed)
Called Sheryl Pratt and she is feeling ok.  She just took an oxycodone because she is having pain again.  She denies having any nausea or vomiting and said she is passing gas now.  Advised her to call tomorrow to let us know how she is doing.

## 2020-08-11 NOTE — Telephone Encounter (Signed)
Transition Care Management Follow-up Telephone Call  Date of discharge and from where: 08/10/2020 from [facility]  How have you been since you were released from the hospital? Pt states that she is feeling okay but she is still experiencing pain from the surgery to remove the pelvic mass.   Any questions or concerns? No  Items Reviewed:  Did the pt receive and understand the discharge instructions provided? Yes   Medications obtained and verified? Yes   Other? No   Any new allergies since your discharge? No   Dietary orders reviewed? n/a  Do you have support at home? Yes   Functional Questionnaire: (I = Independent and D = Dependent) ADLs: I  Bathing/Dressing- I  Meal Prep- I  Eating- I  Maintaining continence- I  Transferring/Ambulation- I  Managing Meds- I   Follow up appointments reviewed:   PCP Hospital f/u appt confirmed? No    Specialist Hospital f/u appt confirmed? Yes  Scheduled to see Jeral Pinch, MD on 08/24/2020 @ 3:00pm.  Are transportation arrangements needed? No   If their condition worsens, is the pt aware to call PCP or go to the Emergency Dept.? Yes  Was the patient provided with contact information for the PCP's office or ED? Yes  Was to pt encouraged to call back with questions or concerns? Yes

## 2020-08-11 NOTE — Telephone Encounter (Signed)
Called Retaj to see how she is feeling after discharge.  She said she was up all night due to pain.  She said it is around her incision and seems to be everywhere.  She was not able to get comfortable and coughing makes it worse.  She is taking Tramadol q 6 hours and is alternating ibuprofen and tylenol but it is not helping.    She also has not had a bowel movement yet.  She is taking Senokot as directed and metamucil.  Advised her that she can also try Miralax once a day.  She reports her incisions are dry and intact and denies having a fever.  She is urinating and is going to try eating some oatmeal this morning.

## 2020-08-12 ENCOUNTER — Telehealth: Payer: Self-pay | Admitting: Gynecologic Oncology

## 2020-08-12 DIAGNOSIS — J019 Acute sinusitis, unspecified: Secondary | ICD-10-CM

## 2020-08-12 LAB — CYTOLOGY - NON PAP

## 2020-08-12 MED ORDER — AMOXICILLIN-POT CLAVULANATE 875-125 MG PO TABS: 1.0000 | ORAL_TABLET | Freq: Two times a day (BID) | ORAL | 0 refills | Status: DC

## 2020-08-12 NOTE — Telephone Encounter (Signed)
Called to check in on patient post-operatively. She states she is doing better today. She states she passed large amounts of flatus yesterday and today and had a BM this am. No nausea or emesis reported. She states she had to take an oxycodone last pm but has only used ibuprofen and one tramadol this am for incisional pain.   She has a productive cough and now greenish drainage for her sinuses. She was having sinus issues before surgery as well. She is going to try Atrovent that she has at home and I advised her I would contact her with further recommendations from Dr. Berline Lopes. No fever reported. Advised her I would call her back with an update. No other concerns voiced.  Update: Returned call to patient and informed her of Dr. Charisse March recommendations for Augmentin to treat her sinus infection. Advised to take this twice a day for 5 days. Reportable signs and symptoms reviewed. Advised to call for any needs. Pt verbalizing understanding.

## 2020-08-15 ENCOUNTER — Inpatient Hospital Stay (HOSPITAL_BASED_OUTPATIENT_CLINIC_OR_DEPARTMENT_OTHER): Payer: 59 | Admitting: Gynecologic Oncology

## 2020-08-15 ENCOUNTER — Encounter: Payer: Self-pay | Admitting: Gynecologic Oncology

## 2020-08-15 ENCOUNTER — Other Ambulatory Visit: Payer: Self-pay | Admitting: Gynecologic Oncology

## 2020-08-15 DIAGNOSIS — C801 Malignant (primary) neoplasm, unspecified: Secondary | ICD-10-CM

## 2020-08-15 DIAGNOSIS — R1900 Intra-abdominal and pelvic swelling, mass and lump, unspecified site: Secondary | ICD-10-CM

## 2020-08-15 DIAGNOSIS — Z9079 Acquired absence of other genital organ(s): Secondary | ICD-10-CM

## 2020-08-15 DIAGNOSIS — C7961 Secondary malignant neoplasm of right ovary: Secondary | ICD-10-CM

## 2020-08-15 NOTE — Progress Notes (Signed)
Gynecologic Oncology Telehealth Consult Note: Gyn-Onc  I connected with Sheryl Pratt on 08/15/20 at 11:00 AM EDT by telephone and verified that I am speaking with the correct person using two identifiers.  I discussed the limitations, risks, security and privacy concerns of performing an evaluation and management service by telemedicine and the availability of in-person appointments. I also discussed with the patient that there may be a patient responsible charge related to this service. The patient expressed understanding and agreed to proceed.  Other persons participating in the visit and their role in the encounter: none.  Patient's location: home Provider's location: [facility]  Reason for Visit: Follow-up after recent surgery.  Treatment History: 2014 -vaginal hysterectomy for pelvic organ prolapse Summer 2021-noticed increasing urinary frequency March 2022-developed intermittent abdominal pain and cramping, constipation. 07/2020: Seen in the emergency department for evaluation of right lower quadrant pain.  CT at that time showed a complex pelvic mass.  CEA, CA 19-9, and CA-125 all normal.   08/09/2020: Exploratory laparotomy with RSO, omentectomy, pelvic washings, peritoneal biopsies.  Interval History: Patient notes she has been improving from a postoperative standpoint.  Pain is less with each day.  Tolerating a regular diet without nausea or emesis.  Constipation slowly improving, both with the use of medications as well as eating prunes.  Still does not feel like bowel function is back to normal.  Reports urinating without difficulty.  Denies fevers, chills, vaginal bleeding.  Past Medical/Surgical History: Past Medical History:  Diagnosis Date  . Arthritis   . Bloating   . Breast mass, right   . Constipation   . Migraine   . Pelvic mass   . PONV (postoperative nausea and vomiting)   . Seasonal allergies   . Vitamin D deficiency     Past Surgical History:  Procedure  Laterality Date  . APPENDECTOMY    . AUGMENTATION MAMMAPLASTY Bilateral   . BREAST BIOPSY Right 2014   fibroadenoma  . BREAST SURGERY  18 years ago   augmentation.  Marland Kitchen LAPAROTOMY  08/09/2020   Procedure: EXPLORATORY LAPAROTOMY, MASS EXCISION, PERITONEAL BIOPSIES;  Surgeon: [name], MD;  Location: [facility];  Service: Gynecology;;  . OMENTECTOMY N/A 08/09/2020   Procedure: OMENTECTOMY;  Surgeon: [name], MD;  Location: [facility];  Service: Gynecology;  Laterality: N/A;  . SALPINGOOPHORECTOMY Right 08/09/2020   Procedure: OPEN RIGHT SALPINGO OOPHORECTOMY;  Surgeon: [name], MD;  Location: [facility];  Service: Gynecology;  Laterality: Right;  Marland Kitchen VAGINAL HYSTERECTOMY  2015    Family History  Problem Relation Age of Onset  . Cancer Brother        pancreatic  . Ovarian cancer Neg Hx   . Uterine cancer Neg Hx   . Breast cancer Neg Hx   . Colon cancer Neg Hx     Social History   Socioeconomic History  . Marital status: Married    Spouse name: Sheryl Pratt  . Number of children: Not on file  . Years of education: Not on file  . Highest education level: Not on file  Occupational History  . Not on file  Tobacco Use  . Smoking status: Current Every Day Smoker    Packs/day: 0.25    Years: 10.00    Pack years: 2.50    Types: Cigarettes  . Smokeless tobacco: Never Used  Vaping Use  . Vaping Use: Never used  Substance and Sexual Activity  . Alcohol use: Yes    Comment: occasional  . Drug  use: No  . Sexual activity: Yes    Birth control/protection: Surgical  Other Topics Concern  . Not on file  Social History Narrative  . Not on file   Social Determinants of Health   Financial Resource Strain: Not on file  Food Insecurity: Not on file  Transportation Needs: Not on file  Physical Activity: Not on file  Stress: Not on file  Social Connections: Not on file    Current Medications:  Current Outpatient Medications:  .  amoxicillin-clavulanate  (AUGMENTIN) 875-125 MG tablet, Take 1 tablet by mouth 2 (two) times daily., Disp: 10 tablet, Rfl: 0 .  cholecalciferol (VITAMIN D3) 25 MCG (1000 UNIT) tablet, Take 1,000 Units by mouth daily., Disp: , Rfl:  .  diphenhydrAMINE (BENADRYL) 25 mg capsule, Take 25 mg by mouth at bedtime as needed for allergies., Disp: , Rfl:  .  enoxaparin (LOVENOX) 40 MG/0.4ML injection, Inject 0.4 mLs (40 mg total) into the skin daily for 27 days., Disp: 10.8 mL, Rfl: 0 .  ibuprofen (ADVIL) 800 MG tablet, Take 1 tablet (800 mg total) by mouth every 8 (eight) hours as needed for moderate pain. For AFTER surgery only, Disp: 30 tablet, Rfl: 0 .  Ibuprofen-diphenhydrAMINE HCl (IBUPROFEN PM) 200-25 MG CAPS, Take 1 tablet by mouth at bedtime as needed (sleep)., Disp: , Rfl:  .  ipratropium (ATROVENT) 0.03 % nasal spray, Place 2 sprays into both nostrils every 12 (twelve) hours. (Patient taking differently: Place 2 sprays into both nostrils 2 (two) times daily as needed for rhinitis.), Disp: 30 mL, Rfl: 0 .  Multiple Vitamin (MULTIVITAMIN) tablet, Take 1 tablet by mouth daily., Disp: , Rfl:  .  oxyCODONE (OXY IR/ROXICODONE) 5 MG immediate release tablet, Take 1 tablet (5 mg total) by mouth every 4 (four) hours as needed for severe pain. For severe pain only, do not take and drive, Disp: 10 tablet, Rfl: 0 .  Psyllium (METAMUCIL PO), Take 1 Dose by mouth daily., Disp: , Rfl:  .  senna-docusate (SENOKOT-S) 8.6-50 MG tablet, Take 2 tablets by mouth at bedtime. Do not take if having diarrhea (Patient taking differently: Take 2 tablets by mouth daily. Do not take if having diarrhea), Disp: 60 tablet, Rfl: 0 .  Tetrahydrozoline HCl (VISINE OP), Place 1 drop into both eyes daily as needed (itching /redness)., Disp: , Rfl:  .  traMADol (ULTRAM) 50 MG tablet, Take 1 tablet (50 mg total) by mouth every 6 (six) hours as needed for severe pain. For AFTER surgery, do not take and drive, Disp: 10 tablet, Rfl: 0 .  vitamin B-12 (CYANOCOBALAMIN)  1000 MCG tablet, Take 1,000 mcg by mouth daily., Disp: , Rfl:  .  vitamin C (ASCORBIC ACID) 500 MG tablet, Take 500 mg by mouth daily., Disp: , Rfl:  .  Vitamin D, Ergocalciferol, (DRISDOL) 1.25 MG (50000 UT) CAPS capsule, Take one tablet wkly (Patient not taking: No sig reported), Disp: 12 capsule, Rfl: 3 .  vitamin E 180 MG (400 UNITS) capsule, Take 400 Units by mouth daily., Disp: , Rfl:   Review of Symptoms: Pertinent positives as per HPI.  Physical Exam: LMP 07/02/2012  Deferred given limitations of phone visit.  Laboratory & Radiologic Studies: FINAL MICROSCOPIC DIAGNOSIS:   A. ADNEXAL MASS, RIGHT, SALPINGO OOPHORECTOMY:  - Adenocarcinoma with signet ring cell features.  - No ovarian surface involvement identified.  - Benign Fallopian tube.  - See comment.   B. PELVIC SIDEWALL, RIGHT, BIOPSY:  - Benign peritoneum and subperitoneal connective tissue.  -  No malignancy identified.   C. CUL DE SAC, ANTERIOR, BIOPSY:  - Benign peritoneum and f connective tissue.  - No malignancy identified.   D. PELVIC SIDEWALL, LEFT, BIOPSY:  - Benign peritoneum and subperitoneal connective tissue.  - No malignancy identified.   E. OMENTUM:  - Benign adipose tissue consistent with omentum.  - No malignancy identified.   F. PARA COLIC GUTTER, RIGHT, BIOPSY:  - Benign peritoneum and subperitoneal connective tissue.  - No malignancy identified.   G. PARA COLIC GUTTER, LEFT, BIOPSY:  - Benign peritoneumand subperitoneal connective tissue.  - No malignancy identified.   H. VAGINAL CUFF:  - Benign Fallopian tube tissue.  - No malignancy identified.   *The right adnexal mass is 17.8 cm in greatest dimension and is diffusely  involved by adenocarcinoma with diffuse signet ring cell features.  Immunohistochemistry shows positivity with cytokeratin 7 and cytokeratin  20. The tumor is negative with estrogen receptor, GATA3, GCDFP, CD56,  chromogranin, synaptic ficin, calretinin, inhibin,  Napsin A, TTF-1, WT1  and PAX 8. 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 Possible primary sites  include upper gastrointestinal tract and pancreas.   Assessment & Plan: Sheryl Pratt is a 55 y.o. woman with Krukenberg tumor of the ovary, now status post robotic resection with mini laparotomy for specimen removal, no other evidence of intra-abdominal or pelvic disease.  Patient is doing well and appears to be meeting postoperative milestones.  We reviewed continued expectations and restrictions.  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  Staining is most consistent with an upper GI or pancreatic source.  Patient has never had a colonoscopy.  I talked to her previously about the importance of scheduling a screening colonoscopy.  She will also need an endoscopy.  I will have my office please referral for both procedures.  Additionally, I will reach out to medical oncology to get the patient set up given 9 GYN malignancy.  All of her questions were answered today.  I discussed the assessment and treatment plan with the patient. The patient was provided with an opportunity to ask questions and all were answered. The patient agreed with the plan and demonstrated an understanding of the instructions.   The patient was advised to call back or see an in-person evaluation if the symptoms worsen or if the condition fails to improve as anticipated.   26 minutes of total time was spent for this patient encounter, including preparation, face-to-face counseling with the patient and coordination of care, and documentation of the encounter.   Jeral Pinch, MD  Division of Gynecologic Oncology  Department of Obstetrics and Gynecology  [facility]

## 2020-08-16 DIAGNOSIS — R1900 Intra-abdominal and pelvic swelling, mass and lump, unspecified site: Secondary | ICD-10-CM

## 2020-08-17 ENCOUNTER — Telehealth: Payer: Self-pay

## 2020-08-17 DIAGNOSIS — C799 Secondary malignant neoplasm of unspecified site: Secondary | ICD-10-CM

## 2020-08-17 NOTE — Telephone Encounter (Signed)
Please have her skip her dose the day before her EGD.  This does not need to be approved by cardiology.  Thanks

## 2020-08-17 NOTE — Telephone Encounter (Signed)
Dr Ardis Hughs the pt has been scheduled for EGD on 5/6 at 2 pm in the [facility].  She is on lovenox should that be held?

## 2020-08-17 NOTE — Progress Notes (Signed)
Left voice message with my direct number for patient to return my call to discuss setting up a time to come into speak with one of our GI medical oncologist regarding the her most recent findings.

## 2020-08-17 NOTE — Telephone Encounter (Signed)
EGD scheduled, pt instructed and medications reviewed.  Patient instructions mailed to home.  Patient to call with any questions or concerns.   Lovenox instructions also given to the pt .   Pt was offered time to ask questions and all concerns addressed.  All information also available on My Chart.    The pt has been advised of the information and verbalized understanding.

## 2020-08-17 NOTE — Progress Notes (Signed)
Patient returned my call.  We discussed her most recent procedure with pathology from ovaries showing adenocarcinoma.  She has been referred to GI for EGD (thickening in stomach wall on scan). I spoke to her this morning and she is quite anxious so I think she would benefit from being seen sooner than later.  I did offer her the option of waiting until after EGD.   She understands that when she comes in on Friday we will not have all the answers.  Her appointment is scheduled for Friday 4/29 at 11:00.  She has my direct number to call should she change her mind about coming in or if she has questions.

## 2020-08-17 NOTE — Telephone Encounter (Signed)
-----   Message from [name], MD sent at 08/17/2020 10:33 AM EDT ----- Regarding: RE: GI source I reviewed her recent ct scan, gyn/onc note and op note, pathology.  I think direct EGD is appropriate.  I do not think she also needs screening colonoscopy at the same time.   [name], Can you reach out to this patient and see about arranging EGD directly for metastatic adenocarcinoma, possibly upper GI tract.  I have availability next Friday, May 6 in the afternoon.  Please grab that spot before it is taken otherwise.  Thanks  It is also okay to cancel her upcoming office appointment with [name].  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 She is scheduled with one of you PA on 5/12, could you help to get her in this week and schedule her endoscopy asap?    Thanks much  [name]  ----- Message ----- From: [name], RN Sent: 08/17/2020   9:57 AM EDT To: [name], MD, [name] Subject: RE: GI source                                  I have left her a voice message to discuss when she would like to come in.  I see she doesn't see anyone at [facility] until 5/12 despite the fact that I asked for it to be asap.  That is just for consult not procedures.  [name]   ----- Message ----- From: [name], MD Sent: 08/15/2020  11:25 AM EDT To: [name], RN, [name], MD, # Subject: RE: GI source                                  Sure, will be happy to see her. I can see her with [name] this Friday or next week. Ideally would like to see her after EGD, but OK to schedule if EGD will take a while.   [name], please help to get her in to see GI ASAP if needed.   Thanks   [name]   ----- Message  ----- From: [name], MD Sent: 08/15/2020  10:51 AM EDT To: [name], RN, [name], MD, # Subject: GI source                                      [name] and [name],  Dr. Berline Lopes operated on this patient, path support upper GI primary Her stomach wall looks thickened on CT She is in the process of being referred to [facility] for EGD and colonoscopy Can you see her in the outpatient to discuss Rx?  Thanks, Ni

## 2020-08-17 NOTE — Progress Notes (Signed)
[place] [phone]   [phone]  CONSULT NOTE  REFERRING PHYSICIAN: Dr. Berline Lopes  REASON FOR CONSULTATION:  Adenocarcinoma   HPI Sheryl Pratt is a 55 y.o. female with a past medical history significant for hysterectomy in 2014 in the setting of pelvic organ prolapse, questionable hyperlipidemia, vitamin D deficiency, and vitamin B12 deficiency is referred to the clinic for an abdominal pelvic mass.  On 07/30/2020, the patient presented to an urgent care for the chief complaint of umbilical/central abdominal pain.  She was subsequently referred to the emergency department for consideration of a CT scan.  The patient states that for 4 weeks prior to her ER visit she was experiencing intermittent abdominal pain. She characterizes her pain as feeling crampy in nature and other times sharp. She reports that she had bowel movements daily although she did attribute some of her abdominal pain to possible constipation due to defecating only small amounts and having pain associated with bowel movements. She was using metamucil and prune juice to help with this. She denied any nausea, vomiting, or fevers. About 2 weeks prior to her ER visit, the patient began noticing increased abdominal girth and an abdominal mass.  She also endorses that she has had increased urinary frequency since the Summer 2021. Denies dysuria, hematuria, or malodorous urine. She has a history of pelvic organ prolapse and thought perhaps she was having prolapse of the bladder.  She denies any melena or hematochezia.  She has a history of hysterectomy and appendectomy.  She denies any changes with her appetite except she lost ~10 pounds after her recent surgery on 08/09/20. Prior to surgery, she estimates she lost 5 lbs in 6 months but thought it was stress related because they are renovating a house in [place]. She denies any pencil thin stools  She denies any early satiety or bloating. She denies  chest pressure, heartburn, or hematemesis. She had a cough that started a few months ago with associated nasal congestion which she attributed to allergies/sinus infection. She was given a nasal spray and amoxicillin with some improvement in her symptoms. She states it is not unusual for her to have allergies during this time of year.   The patient had a CT scan performed in the emergency room (07/30/20) which showed a large hypervascular soft tissue mass in the pelvis compressing the urinary bladder and GI structures.  There was also an enlarged left adrenal gland which was concerning for metastatic disease.  She had tumor markers with a CEA (2.3), Ca125 (34.9) , and a CA 19-9 (5) that were within normal limits.  No anemia noted on baseline labs except for some anemia following her recent surgery.  She had an appointment on 08/02/2020 with Dr. Berline Lopes from gynecologic oncology.  Although the origin of the mass was unknown, it was initially suspicious that it was arising from one of her adnexa.  The plan was to performed a diagnostic laparoscopy with right salpingo-oophorectomy, omentectomy washings, peritoneal biopsies, and omentectomy to assure the origin of the mass.  This was performed on 08/09/2020.  They found a mobile solid right adnexal mass which was surgically removed.  The final pathology ([id number]) was consistent with adenocarcinoma with signet ring cell features. The right adnexal mass is 17.8 cm in greatest dimension. 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 Possible primary sites  include upper gastrointestinal tract and pancreas based on immunohistochemical staining.   She had a  follow-up appointment on 08/15/2020 with Dr. Berline Lopes who discussed that this is likely metastatic adenocarcinoma and not a primary ovarian tumor.  The patient had never had a colonoscopy or an endoscopy.  She was subsequently referred to [facility]  gastroenterology.   Dr. Ardis Hughs is planning on performing a direct EGD on 08/26/2020. Dr. Burr Medico spoke to pathology and they feel this is likely upper GI, but lower GI malignancy cannot be excluded. There is ongoing discussion about adding possible colonoscopy with EGD on 08/26/20.   Overall, the patient's main concern today is related to postsurgical bulging near the incision site from her recent surgery.  There is no erythema or drainage.  There is some tenderness and swelling to the right upper region of the incision.  He has a follow-up appointment with GynOnc next week.  She also is having some postsurgical pain for which she has a prescription for 800 mg of NSAIDs, tramadol, and oxycodone if needed.  Presently, she is only taking NSAIDs. She also lost about 10 lbs since her surgery.   The patient's family history consists of a brother who passed away at the age of 75 due to pancreatic cancer.  The patient's mother passed away of congestive heart failure in her 3s.  The patient's father had COPD and passed away in his 71s.  The patient has 3 brothers 1 of which passed away unexpectedly of a suspected heart attack.  The patient has 3 sisters most of which are healthy except for hyperlipidemia and vitamin D deficiency.  She denies any other family history of cancer except for a paternal uncle with cancer of unknown primary.  The patient used to work as a Scientist, clinical (histocompatibility and immunogenetics) but has not been working the last few years due to caring for her sister.  The patient is married and has 1 stepson and 2 biologic children.  Her daughter is 58 and her son is 45 both of which who live in [place].  She states that she has been smoking off and on since she was about 13-15 year of age.  She states that she was never a heavy smoker.  She states 2 packs typically would last her a week.  Presently, she is smoking between 0 to 2 cigarettes/day.  The patient states that she drinks a few alcoholic beverages total per year.  She  denies any history of drug use.   HPI  Past Medical History:  Diagnosis Date  . Arthritis   . Bloating   . Breast mass, right   . Constipation   . Migraine   . Pelvic mass   . PONV (postoperative nausea and vomiting)   . Seasonal allergies   . Vitamin D deficiency     Past Surgical History:  Procedure Laterality Date  . APPENDECTOMY    . AUGMENTATION MAMMAPLASTY Bilateral   . BREAST BIOPSY Right 2014   fibroadenoma  . BREAST SURGERY  18 years ago   augmentation.  Marland Kitchen LAPAROTOMY  08/09/2020   Procedure: EXPLORATORY LAPAROTOMY, MASS EXCISION, PERITONEAL BIOPSIES;  Surgeon: [name], MD;  Location: [facility];  Service: Gynecology;;  . OMENTECTOMY N/A 08/09/2020   Procedure: OMENTECTOMY;  Surgeon: [name], MD;  Location: [facility];  Service: Gynecology;  Laterality: N/A;  . SALPINGOOPHORECTOMY Right 08/09/2020   Procedure: OPEN RIGHT SALPINGO OOPHORECTOMY;  Surgeon: [name], MD;  Location: [facility];  Service: Gynecology;  Laterality: Right;  Marland Kitchen VAGINAL HYSTERECTOMY  2015    Family History  Problem Relation  Age of Onset  . Cancer Brother        pancreatic  . Ovarian cancer Neg Hx   . Uterine cancer Neg Hx   . Breast cancer Neg Hx   . Colon cancer Neg Hx     Social History Social History   Tobacco Use  . Smoking status: Current Every Day Smoker    Packs/day: 0.25    Years: 10.00    Pack years: 2.50    Types: Cigarettes  . Smokeless tobacco: Never Used  . Tobacco comment: 1-2 cigs QD  Vaping Use  . Vaping Use: Never used  Substance Use Topics  . Alcohol use: Yes    Comment: occasional  . Drug use: No    Allergies  Allergen Reactions  . Codeine Nausea Only  . Wellbutrin [Bupropion]     insomnia    Current Outpatient Medications  Medication Sig Dispense Refill  . cholecalciferol (VITAMIN D3) 25 MCG (1000 UNIT) tablet Take 1,000 Units by mouth daily.    . diphenhydrAMINE (BENADRYL) 25 mg capsule Take 25 mg by mouth at bedtime as  needed for allergies.    Marland Kitchen enoxaparin (LOVENOX) 40 MG/0.4ML injection Inject 0.4 mLs (40 mg total) into the skin daily for 27 days. 10.8 mL 0  . ibuprofen (ADVIL) 800 MG tablet Take 1 tablet (800 mg total) by mouth every 8 (eight) hours as needed for moderate pain. For AFTER surgery only 30 tablet 0  . Ibuprofen-diphenhydrAMINE HCl (IBUPROFEN PM) 200-25 MG CAPS Take 1 tablet by mouth at bedtime as needed (sleep).    Marland Kitchen ipratropium (ATROVENT) 0.03 % nasal spray Place 2 sprays into both nostrils every 12 (twelve) hours. (Patient taking differently: Place 2 sprays into both nostrils 2 (two) times daily as needed for rhinitis.) 30 mL 0  . Multiple Vitamin (MULTIVITAMIN) tablet Take 1 tablet by mouth daily.    Marland Kitchen oxyCODONE (OXY IR/ROXICODONE) 5 MG immediate release tablet Take 1 tablet (5 mg total) by mouth every 4 (four) hours as needed for severe pain. For severe pain only, do not take and drive 10 tablet 0  . Psyllium (METAMUCIL PO) Take 1 Dose by mouth daily.    Marland Kitchen senna-docusate (SENOKOT-S) 8.6-50 MG tablet Take 2 tablets by mouth at bedtime. Do not take if having diarrhea (Patient taking differently: Take 2 tablets by mouth daily. Do not take if having diarrhea) 60 tablet 0  . Tetrahydrozoline HCl (VISINE OP) Place 1 drop into both eyes daily as needed (itching /redness).    . traMADol (ULTRAM) 50 MG tablet Take 1 tablet (50 mg total) by mouth every 6 (six) hours as needed for severe pain. For AFTER surgery, do not take and drive 10 tablet 0  . vitamin B-12 (CYANOCOBALAMIN) 1000 MCG tablet Take 1,000 mcg by mouth daily.    . vitamin C (ASCORBIC ACID) 500 MG tablet Take 500 mg by mouth daily.    . vitamin E 180 MG (400 UNITS) capsule Take 400 Units by mouth daily.    . Vitamin D, Ergocalciferol, (DRISDOL) 1.25 MG (50000 UT) CAPS capsule Take one tablet wkly (Patient not taking: Reported on 08/19/2020) 12 capsule 3   No current facility-administered medications for this visit.    REVIEW OF SYSTEMS:    Review of Systems  Constitutional: Positive for appetite change and weight loss. Negative for chills and fever.  HENT: Positive for seasonal allergies. Negative for mouth sores, nosebleeds, sore throat and trouble swallowing.   Eyes: Negative for eye problems and  icterus.  Respiratory: Positive for cough. Negative for hemoptysis, shortness of breath and wheezing.   Cardiovascular: Negative for chest pain and leg swelling.  Gastrointestinal: Positive for abdominal pain and constipation. Positive for "buldging" right lateral abdominal incision site. No drainage, erythema, or warmth. Negative for diarrhea, nausea and vomiting.  Genitourinary: Positive for urinary frequency. Negative for bladder incontinence, difficulty urinating, dysuria, and hematuria.   Musculoskeletal: Negative for back pain, gait problem, neck pain and neck stiffness.  Skin: Negative for itching and rash.  Neurological: Negative for dizziness, extremity weakness, gait problem, headaches, light-headedness and seizures.  Hematological: Negative for adenopathy. Does not bruise/bleed easily.  Psychiatric/Behavioral: Negative for confusion, depression and sleep disturbance. The patient is not nervous/anxious.     PHYSICAL EXAMINATION:  Blood pressure 99/66, pulse 83, temperature (!) 97.2 F (36.2 C), temperature source Tympanic, resp. rate 18, weight 116 lb (52.6 kg), last menstrual period 07/02/2012, SpO2 99 %.  ECOG PERFORMANCE STATUS: 1 - Symptomatic but completely ambulatory  Physical Exam  Constitutional: Oriented to person, place, and time and thin appearing female and in no distress.  HENT:  Head: Normocephalic and atraumatic.  Mouth/Throat: Oropharynx is clear and moist. No oropharyngeal exudate.  Eyes: Conjunctivae are normal. Right eye exhibits no discharge. Left eye exhibits no discharge. No scleral icterus.  Neck: Normal range of motion. Neck supple.  Cardiovascular: Normal rate, regular rhythm, normal heart  sounds and intact distal pulses.   Pulmonary/Chest: Effort normal and breath sounds normal. No respiratory distress. No wheezes. No rales.  Abdominal: Soft. Large incision in abdomen healing well. Glue present. No drainage, erythema, or warmth. Tenderness to palpation to the right of incision. Bowel sounds are normal. Please refer to mychart message from 08/19/20 for reference photo.  Musculoskeletal: Normal range of motion. Exhibits no edema.  Lymphadenopathy:    No cervical adenopathy.  Neurological: Alert and oriented to person, place, and time. Exhibits normal muscle tone. Gait normal. Coordination normal.  Skin: Skin is warm and dry. No rash noted. Not diaphoretic. No erythema. No pallor.  Psychiatric: Mood, memory and judgment normal.  Vitals reviewed.   LABORATORY DATA: Lab Results  Component Value Date   WBC 8.7 08/10/2020   HGB 9.6 (L) 08/10/2020   HCT 29.0 (L) 08/10/2020   MCV 95.4 08/10/2020   PLT 222 08/10/2020      Chemistry      Component Value Date/Time   NA 135 08/10/2020 0419   NA 142 03/10/2019 0944   K 4.3 08/10/2020 0419   CL 107 08/10/2020 0419   CO2 23 08/10/2020 0419   BUN 10 08/10/2020 0419   BUN 8 03/10/2019 0944   CREATININE 0.56 08/10/2020 0419      Component Value Date/Time   CALCIUM 7.8 (L) 08/10/2020 0419   ALKPHOS 60 07/30/2020 1228   AST 15 07/30/2020 1228   ALT 9 07/30/2020 1228   BILITOT 0.2 (L) 07/30/2020 1228   BILITOT 0.4 03/10/2019 0944       RADIOGRAPHIC STUDIES: CT ABDOMEN PELVIS W CONTRAST  Result Date: 07/30/2020 CLINICAL DATA:  Abdominal pain.  Hernia suspected. EXAM: CT ABDOMEN AND PELVIS WITH CONTRAST TECHNIQUE: Multidetector CT imaging of the abdomen and pelvis was performed using the standard protocol following bolus administration of intravenous contrast. CONTRAST:  162mL OMNIPAQUE IOHEXOL 300 MG/ML  SOLN COMPARISON:  None. FINDINGS: Lower chest: No acute abnormality. Hepatobiliary: No focal liver abnormality is seen. No  gallstones, gallbladder wall thickening, or biliary dilatation. Pancreas: Unremarkable. No pancreatic ductal dilatation or surrounding  inflammatory changes. Spleen: Normal in size without focal abnormality. Adrenals/Urinary Tract: Normal right adrenal gland. The left adrenal gland is enlarged to 2.4 x 1.8 cm. No evidence of renal masses or hydronephrosis. Right lower pole nonobstructive renal calculus measures 4 mm. Stomach/Bowel: Stomach is within normal limits. No evidence of bowel wall thickening, distention, or inflammatory changes. Vascular/Lymphatic: Aortic atherosclerosis. No enlarged abdominal or pelvic lymph nodes. Reproductive: Post hysterectomy. Other: Large hypervascular soft tissue mass occupies the pelvis, compressing the urinary bladder and GI structures. The mass measures 15.7 x 10.9 x 12.8 cm. No definite connection of this mass is seen to the vaginal cuff. The ovaries are not identified. Musculoskeletal: No acute or significant osseous findings. IMPRESSION: 1. Large hypervascular soft tissue mass occupies the pelvis, compressing the urinary bladder and GI structures. No definite connection of this mass is seen to the vaginal cuff. The ovaries are not identified. 2. Enlarged left adrenal gland. Given the presence of soft tissue mass in the pelvis, this may represent metastatic disease to the adrenal gland. 3. Further evaluation with MRI of the abdomen and pelvis may be considered. Surgical consultation is also advised. 4. Right lower pole nonobstructive nephrolithiasis. 5. Aortic atherosclerosis. Electronically Signed   By: Fidela Salisbury M.D.   On: 07/30/2020 13:57    ASSESSMENT: This is a very pleasant 55 year old Caucasian female diagnosed with  1) Adenocarcinoma with signet ring cell features: immunohistochemical staining suggests upper GI or pancreas primary, although lower GI malignancy cannot be excluded at this point.  -CT 07/30/20 revealed large hypervascular soft tissue mass that  occupies the pelvis, compressing the urinary bladder and GI structures. Enlarged left adrenal gland. Given the presence of soft tissue mass in the pelvis, this may represent metastatic disease to the adrenal gland.  -She had tumor markers with a CEA (2.3), Ca125 (34.9) , and a CA 19-9 (5) that were within normal limits. -08/09/20 diagnostic laparoscopy with right salpingo-oophorectomy, omentectomy washings, peritoneal biopsies, and omentectomy to assure the origin of the mass. Mass surgically resected. Pelvic wall, ovaries, and omentum biopsies negative. The adnexal mass 17.8 cm largest dimension. Final pathology [id number]) adenocarcinoma with diffuse signet ring cell features. Possible primary sites based on immunohistochemical staining suggested to be upper gastrointestinal tract and pancreas, although lower GI malignancy cannot be excluded. Dr. Burr Medico requested CDX2 staining.   -The patient was seen with Dr. Burr Medico today. Dr. Burr Medico had a lengthy discussion with the patient about her current condition and recommended further workup. She discussed that her disease is stage IV which is treatable but may not be curable. Dr. Burr Medico discussed prognosis depends on primary site of malignancy.  -She is scheduled for direct EGD with Dr. Ardis Hughs on 08/26/20. Dr. Burr Medico will reach out to GI to see if they can consider adding colonoscopy the same day as EGD if no source of malignancy is identified on EGD. Of course, if a source of malignancy is found on EGD, then colonoscopy is not needed. The patient has never had a routine colonoscopy.  -Dr. Burr Medico recommends ordering a PET scan to evaluate for other sites of metastatic disease. This will also be helpful in assessing if the enlarged left adrenal gland is hypermetabolic/metastatic.  -Dr. Burr Medico will likely send foundation one testing in the future once we know the primary site of malignancy.  -We will see her back for a follow up visit after her EGD and PET scan for a more  detailed discussion about her current condition and recommended treatment options.  2. Bulging near abdominal incision/Post Op Pain -Please refer to the patient mychart message from 08/19/20 for imaging. Dr. Burr Medico does not believe this is a serious pathology and may be related to sutures, seroma, swelling, or hematoma. Advised to monitor for now. -We reached out to [facility] who reviewed the imaging. GynOnc believes this is likely normal. They directly reached out to the patient and reassured her this was normal. They are scheduled to follow up with her next week  -The patient will continue to take NSAIDs for pain control. If needed, she has a prescription for tramadol and oxycodone  3. Family History of Malignancy -The patient's brother had pancreatic cancer, diagnosed at age 60.  -Depending on the primary malignancy, Dr. Burr Medico may consider genetic testing in the future. However, she will hold off on that for now until we have confirmed the primary site of malignancy.   4. Weight loss -The patient lost ~10 lbs since her surgery.  -Dr. Burr Medico offered a referral to the nutritionist, which the patient politely declined.  -Dr. Burr Medico encouraged a high protein high calorie diet. Dr. Burr Medico encouraged her to gain weight in the event she should undergo chemotherapy in the near future. She also encouraged the patient to be active.   5. Constipation -The patient has been drinking prune juice and using metamucil -Last bowel movement yesterday -I reviewed constipation eduction with the patient. Discussed the goal is to have a bowel movement daily or every other day. Drinking plenty of fluids, eating fruits and vegetables, and being active helps reduce the risk of constipation. Also advised she may try stool softeners. If she has not had a bowel movement in 2 or more days, then she was encouraged to pick up a laxative.    PLAN: -Order PET scan  -F/U with Dr. Burr Medico after PET scan and EGD/possible  colonoscopy   Disclaimer: This note was dictated with voice recognition software. Similar sounding words can inadvertently be transcribed and may not be corrected upon review.   Sonora Catlin L Orville Mena August 19, 2020, 4:10 PM  Addendum  I have seen the patient, examined her. I agree with the assessment and and plan and have edited the notes.   Sheryl Pratt is a 55 year old female without significant medical comorbidities, presented with lower abdominal pain.  Work-up showed a large pelvic tumor, status post BSO and tumor resection.  I discussed the surgical pathology findings with patient in great detail.  I also discussed with pathologist Dr. Saralyn Pilar today, based on the immunostain and the morphology, this is likely GI primary, especially upper GI, with pelvic metastasis.  Her CT abdomen pelvis otherwise negative for other primary tumor except enlarged left adrenal gland.  I recommend staging PET scan.  She has been referred to low-power GI for upper endoscopy.  I will request colonoscopy if EGD negative.  We discussed molecular testing, will send out after PET and EGD.  We discussed that this is metastatic disease, unlikely curable, the prognosis is dependent on the primary tumor location.  The treatment is likely systemic treatment, especially chemotherapy.  I plan to see her back after PET scan and endoscopy, to finalize her treatment plan.  All questions were answered.  [name]  08/19/2020

## 2020-08-19 ENCOUNTER — Other Ambulatory Visit: Payer: Self-pay

## 2020-08-19 ENCOUNTER — Encounter: Payer: Self-pay | Admitting: Gynecologic Oncology

## 2020-08-19 ENCOUNTER — Inpatient Hospital Stay: Payer: 59 | Admitting: Physician Assistant

## 2020-08-19 ENCOUNTER — Encounter: Payer: Self-pay | Admitting: Physician Assistant

## 2020-08-19 DIAGNOSIS — K5900 Constipation, unspecified: Secondary | ICD-10-CM

## 2020-08-19 DIAGNOSIS — R1900 Intra-abdominal and pelvic swelling, mass and lump, unspecified site: Secondary | ICD-10-CM

## 2020-08-19 DIAGNOSIS — C169 Malignant neoplasm of stomach, unspecified: Secondary | ICD-10-CM | POA: Insufficient documentation

## 2020-08-19 DIAGNOSIS — F17210 Nicotine dependence, cigarettes, uncomplicated: Secondary | ICD-10-CM | POA: Diagnosis not present

## 2020-08-19 DIAGNOSIS — C801 Malignant (primary) neoplasm, unspecified: Secondary | ICD-10-CM

## 2020-08-19 DIAGNOSIS — C7960 Secondary malignant neoplasm of unspecified ovary: Secondary | ICD-10-CM | POA: Insufficient documentation

## 2020-08-19 NOTE — Telephone Encounter (Signed)
Told Sheryl Pratt and told her that Sheryl Pratt and Dr. Berline Lopes reviewed the picture of her incision and the incision looks normal and it is reassuring.  Pt verbalized understanding.

## 2020-08-19 NOTE — Progress Notes (Signed)
Met with patient and her husband [name] today at initial medical oncology consult with Cassandra Heilingoetter PA and Dr. [name].  I introduced myself and explained my role as nurse navigator.  She was given my direct contact information and encouraged to call with questions or concerns.  She is scheduled for an EGD on 08/26/2020 by Dr. Ardis Hughs.  She is aware a PET scan has been ordered today, awaiting insurance approval this will be scheduled.  I have contact Victoriano Lain (PA specialist) to expedite this.  No barriers to care have been identified at this time.  The patient is quite worried and anxious which is appropriate in this setting.  Her husband seems supportive and she has family locally as well.

## 2020-08-22 ENCOUNTER — Other Ambulatory Visit: Payer: Self-pay | Admitting: Physician Assistant

## 2020-08-22 DIAGNOSIS — C7961 Secondary malignant neoplasm of right ovary: Secondary | ICD-10-CM

## 2020-08-22 DIAGNOSIS — C801 Malignant (primary) neoplasm, unspecified: Secondary | ICD-10-CM | POA: Insufficient documentation

## 2020-08-23 ENCOUNTER — Other Ambulatory Visit: Payer: Self-pay | Admitting: Hematology

## 2020-08-23 ENCOUNTER — Telehealth: Payer: Self-pay

## 2020-08-23 MED ORDER — LORAZEPAM 1 MG PO TABS: 1.0000 mg | ORAL_TABLET | Freq: Once | ORAL | 0 refills | Status: AC

## 2020-08-23 NOTE — Progress Notes (Signed)
Patient had inquired about whether it would be okay to take Dandelion Extract.  Apparently her son had read it was good for cancer patients.  I checked with our pharmacist and apparently its mechanism is like a diuretic.  I have instructed the patient not to take this as her blood pressure already runs very low and this will pull much needed fluid off the body.  We advise her to not start any new supplements during treatment.  The patient verbalized an understanding.

## 2020-08-23 NOTE — Telephone Encounter (Signed)
Patient is asking for something to take prior to her PET scan as she is claustrophobic.  She uses [place] listed in her chart.

## 2020-08-23 NOTE — Telephone Encounter (Signed)
I called in ativan 1mg  #2, take one 30-60 mins before scan and second one as needed right before scan. Please let pt know, thanks.   [name] MD

## 2020-08-24 ENCOUNTER — Other Ambulatory Visit: Payer: Self-pay | Admitting: Hematology

## 2020-08-24 ENCOUNTER — Ambulatory Visit: Payer: 59 | Admitting: Gynecologic Oncology

## 2020-08-24 MED ORDER — LORAZEPAM 1 MG PO TABS: 1.0000 mg | ORAL_TABLET | Freq: Once | ORAL | 0 refills | Status: AC

## 2020-08-24 NOTE — Telephone Encounter (Signed)
Sure, I e-scribed again.   [name] MD

## 2020-08-26 ENCOUNTER — Ambulatory Visit (AMBULATORY_SURGERY_CENTER): Payer: 59 | Admitting: Gastroenterology

## 2020-08-26 ENCOUNTER — Other Ambulatory Visit: Payer: Self-pay

## 2020-08-26 ENCOUNTER — Encounter: Payer: Self-pay | Admitting: Gastroenterology

## 2020-08-26 VITALS — BP 98/53 | HR 56 | Temp 98.0°F | Resp 23 | Ht 68.0 in | Wt 116.0 lb

## 2020-08-26 DIAGNOSIS — K2980 Duodenitis without bleeding: Secondary | ICD-10-CM

## 2020-08-26 DIAGNOSIS — K2970 Gastritis, unspecified, without bleeding: Secondary | ICD-10-CM | POA: Diagnosis not present

## 2020-08-26 DIAGNOSIS — C799 Secondary malignant neoplasm of unspecified site: Secondary | ICD-10-CM

## 2020-08-26 DIAGNOSIS — K2990 Gastroduodenitis, unspecified, without bleeding: Secondary | ICD-10-CM

## 2020-08-26 DIAGNOSIS — C163 Malignant neoplasm of pyloric antrum: Secondary | ICD-10-CM

## 2020-08-26 MED ORDER — SODIUM CHLORIDE 0.9 % IV SOLN: 500.0000 mL | Freq: Once | INTRAVENOUS | Status: DC

## 2020-08-26 NOTE — Progress Notes (Signed)
pt tolerated well. VSS. awake and to recovery. Report given to RN. Bite block left insitu to recovery. No trauma. 

## 2020-08-26 NOTE — Patient Instructions (Signed)
YOU HAD AN ENDOSCOPIC PROCEDURE TODAY AT THE [facility]:   Refer to the procedure report that was given to you for any specific questions about what was found during the examination.  If the procedure report does not answer your questions, please call your gastroenterologist to clarify.  If you requested that your care partner not be given the details of your procedure findings, then the procedure report has been included in a sealed envelope for you to review at your convenience later.  YOU SHOULD EXPECT: Some feelings of bloating in the abdomen. Passage of more gas than usual.  Walking can help get rid of the air that was put into your GI tract during the procedure and reduce the bloating. If you had a lower endoscopy (such as a colonoscopy or flexible sigmoidoscopy) you may notice spotting of blood in your stool or on the toilet paper. If you underwent a bowel prep for your procedure, you may not have a normal bowel movement for a few days.  Please Note:  You might notice some irritation and congestion in your nose or some drainage.  This is from the oxygen used during your procedure.  There is no need for concern and it should clear up in a day or so.  SYMPTOMS TO REPORT IMMEDIATELY:   Following lower endoscopy (colonoscopy or flexible sigmoidoscopy):  Excessive amounts of blood in the stool  Significant tenderness or worsening of abdominal pains  Swelling of the abdomen that is new, acute  Fever of 100F or higher   Following upper endoscopy (EGD)  Vomiting of blood or coffee ground material  New chest pain or pain under the shoulder blades  Painful or persistently difficult swallowing  New shortness of breath  Fever of 100F or higher  Black, tarry-looking stools  For urgent or emergent issues, a gastroenterologist can be reached at any hour by calling [phone]. Do not use MyChart messaging for urgent concerns.    DIET:  We do recommend a small meal at first, but  then you may proceed to your regular diet.  Drink plenty of fluids but you should avoid alcoholic beverages for 24 hours.  ACTIVITY:  You should plan to take it easy for the rest of today and you should NOT DRIVE or use heavy machinery until tomorrow (because of the sedation medicines used during the test).    FOLLOW UP: Our staff will call the number listed on your records 48-72 hours following your procedure to check on you and address any questions or concerns that you may have regarding the information given to you following your procedure. If we do not reach you, we will leave a message.  We will attempt to reach you two times.  During this call, we will ask if you have developed any symptoms of COVID 19. If you develop any symptoms (ie: fever, flu-like symptoms, shortness of breath, cough etc.) before then, please call [phone].  If you test positive for Covid 19 in the 2 weeks post procedure, please call and report this information to us.    If any biopsies were taken you will be contacted by phone or by letter within the next 1-3 weeks.  Please call us at [phone] if you have not heard about the biopsies in 3 weeks.    SIGNATURES/CONFIDENTIALITY: You and/or your care partner have signed paperwork which will be entered into your electronic medical record.  These signatures attest to the fact that that the information above on   your After Visit Summary has been reviewed and is understood.  Full responsibility of the confidentiality of this discharge information lies with you and/or your care-partner. 

## 2020-08-26 NOTE — Progress Notes (Signed)
Called to room to assist during endoscopic procedure.  Patient ID and intended procedure confirmed with present staff. Received instructions for my participation in the procedure from the performing physician.  

## 2020-08-26 NOTE — Op Note (Addendum)
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 The patient's tolerance of                            previous anesthesia was also reviewed. The risks                            and benefits of the procedure and the sedation                            options and risks were discussed with the patient.                            All questions were answered, and informed consent                            was obtained. Prior Anticoagulants: The patient has                            taken Lovenox (enoxaparin), last dose was 2 days                            prior to procedure. ASA Grade Assessment: III - A                            patient with severe systemic disease. After                            reviewing the risks and benefits, the patient was                            deemed in satisfactory condition to undergo the  procedure.                           After obtaining informed consent, the endoscope was                            passed under direct vision. Throughout the                            procedure, the patient's blood pressure, pulse, and                            oxygen saturations were monitored continuously. The                            Endoscope was introduced through the  mouth, and                            advanced to the second part of duodenum. The upper                            GI endoscopy was accomplished without difficulty.                            The patient tolerated the procedure well. Scope In: Scope Out: Findings:                 Normal esophagus.                           Mild inflammation characterized by erythema and                            granularity was found in the gastric antrum and                            body. Biopsies were taken with a cold forceps for                            histology. jar 2                           Mild bulbar duodenitis, biospied and sent to                            pathology. jar 1                           The exam was otherwise without abnormality. Complications:            No immediate complications. Estimated blood loss:                            None. Estimated Blood Loss:     Estimated blood loss: none. Impression:               - There were no overtly neoplastic  findings.                           - There was mild, non-specific gastritis and bulbar                            duodenitis. Both sites were biopsied given large                            pelvic mass pathology. Recommendation:           - Patient has a contact number available for                            emergencies. The signs and symptoms of potential                            delayed complications were discussed with the                            patient. Return to normal activities tomorrow.                            Written discharge instructions were provided to the                            patient.                           - Resume previous diet.                           - Continue present medications. It is safe to                            resume your lovenox tonight.                           - Await pathology results.                           - Will likely need expedited colonoscopy, pending                             these results and upcoming PET scan results. [name], MD 08/26/2020 2:21:40 PM This report has been signed electronically.

## 2020-08-26 NOTE — Progress Notes (Signed)
Medical history reviewed. VS assessed by S.H, RN 

## 2020-08-29 ENCOUNTER — Other Ambulatory Visit: Payer: Self-pay

## 2020-08-29 ENCOUNTER — Ambulatory Visit (HOSPITAL_COMMUNITY)
Admission: RE | Admit: 2020-08-29 | Discharge: 2020-08-29 | Disposition: A | Discharge status: Home / Self Care | Payer: 59 | Source: Ambulatory Visit | Attending: Physician Assistant | Admitting: Physician Assistant

## 2020-08-29 DIAGNOSIS — C7961 Secondary malignant neoplasm of right ovary: Secondary | ICD-10-CM | POA: Diagnosis present

## 2020-08-29 LAB — GLUCOSE, CAPILLARY: Glucose-Capillary: 84 mg/dL (ref 70–99)

## 2020-08-29 MED ORDER — FLUDEOXYGLUCOSE F - 18 (FDG) INJECTION
5.5000 | Freq: Once | INTRAVENOUS | Status: AC
  Administered 2020-08-29: 6.36 via INTRAVENOUS

## 2020-08-30 ENCOUNTER — Telehealth: Payer: Self-pay | Admitting: *Deleted

## 2020-08-30 ENCOUNTER — Telehealth: Payer: Self-pay

## 2020-08-30 NOTE — Telephone Encounter (Signed)
  Follow up Call-  Call back number 08/26/2020  Post procedure Call Back phone  # [phone]  Permission to leave phone message Yes  Some recent data might be hidden     Patient questions:  Do you have a fever, pain , or abdominal swelling? No. Pain Score  0 *  Have you tolerated food without any problems? Yes.    Have you been able to return to your normal activities? Yes.    Do you have any questions about your discharge instructions: Diet   No. Medications  No. Follow up visit  No.  Do you have questions or concerns about your Care? No.  Actions: * If pain score is 4 or above: No action needed, pain <4. 1. Have you developed a fever since your procedure? no  2.   Have you had an respiratory symptoms (SOB or cough) since your procedure? no  3.   Have you tested positive for COVID 19 since your procedure no  4.   Have you had any family members/close contacts diagnosed with the COVID 19 since your procedure?  no   If yes to any of these questions please route to Joylene John, RN and Joella Prince, RN

## 2020-08-30 NOTE — Telephone Encounter (Signed)
First attempt, left VM.  

## 2020-08-31 ENCOUNTER — Telehealth: Payer: Self-pay

## 2020-08-31 ENCOUNTER — Inpatient Hospital Stay: Payer: 59 | Attending: Gynecologic Oncology | Admitting: Hematology

## 2020-08-31 ENCOUNTER — Telehealth: Payer: Self-pay | Admitting: Oncology

## 2020-08-31 ENCOUNTER — Encounter: Payer: Self-pay | Admitting: Hematology

## 2020-08-31 DIAGNOSIS — Z452 Encounter for adjustment and management of vascular access device: Secondary | ICD-10-CM | POA: Insufficient documentation

## 2020-08-31 DIAGNOSIS — C7961 Secondary malignant neoplasm of right ovary: Secondary | ICD-10-CM | POA: Diagnosis not present

## 2020-08-31 DIAGNOSIS — C799 Secondary malignant neoplasm of unspecified site: Secondary | ICD-10-CM

## 2020-08-31 DIAGNOSIS — C7951 Secondary malignant neoplasm of bone: Secondary | ICD-10-CM | POA: Insufficient documentation

## 2020-08-31 DIAGNOSIS — R634 Abnormal weight loss: Secondary | ICD-10-CM | POA: Insufficient documentation

## 2020-08-31 DIAGNOSIS — C801 Malignant (primary) neoplasm, unspecified: Secondary | ICD-10-CM

## 2020-08-31 DIAGNOSIS — Z5111 Encounter for antineoplastic chemotherapy: Secondary | ICD-10-CM | POA: Insufficient documentation

## 2020-08-31 DIAGNOSIS — C169 Malignant neoplasm of stomach, unspecified: Secondary | ICD-10-CM | POA: Insufficient documentation

## 2020-08-31 DIAGNOSIS — K5900 Constipation, unspecified: Secondary | ICD-10-CM | POA: Insufficient documentation

## 2020-08-31 DIAGNOSIS — G8918 Other acute postprocedural pain: Secondary | ICD-10-CM | POA: Insufficient documentation

## 2020-08-31 MED ORDER — ONDANSETRON HCL 8 MG PO TABS: 8.0000 mg | ORAL_TABLET | Freq: Two times a day (BID) | ORAL | 1 refills | Status: DC | PRN

## 2020-08-31 MED ORDER — PROCHLORPERAZINE MALEATE 10 MG PO TABS: 10.0000 mg | ORAL_TABLET | Freq: Four times a day (QID) | ORAL | 1 refills | Status: DC | PRN

## 2020-08-31 MED ORDER — LIDOCAINE-PRILOCAINE 2.5-2.5 % EX CREA: TOPICAL_CREAM | CUTANEOUS | 3 refills | Status: DC

## 2020-08-31 NOTE — Telephone Encounter (Signed)
Colon  scheduled, pt instructed and medications reviewed.  Patient instructions sent to My Chart.  Patient to call with any questions or concerns.

## 2020-08-31 NOTE — Progress Notes (Signed)
START OFF PATHWAY REGIMEN - Other   [id number] (Leucovorin IV D1 + Fluorouracil IV D1/CIV D1,2 + Oxaliplatin IV D1) q14 Days:   A cycle is every 14 days:     Oxaliplatin      Leucovorin      Fluorouracil      Fluorouracil   **Always confirm dose/schedule in your pharmacy ordering system**  Patient Characteristics: Intent of Therapy: Non-Curative / Palliative Intent, Discussed with Patient 

## 2020-08-31 NOTE — Telephone Encounter (Signed)
Sheryl Pratt and rescheduled her post op appointment with Dr. Berline Lopes to 09/05/20 due to scheduled colonoscopy on 09/02/20.

## 2020-08-31 NOTE — Telephone Encounter (Signed)
-----   Message from [name], MD sent at 08/31/2020  7:15 AM EDT ----- Regarding: RE: Yes, we will get her in on Friday for a colonoscopy.  Thanks  Kimela Malstrom, Can you please contact her, offer her 7:30 AM [facility] colonoscopy for this Friday, the 13th.  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 Do you still plan to bring her in for colonoscopy? I do not see it's scheduled.   Dr. Berline Lopes, you are seeing her back this Friday. The primary is still unknown at this point. Bone mets is not common from ovarian cancer, right? Not sure if she needs bronchoscopy if colonoscopy negative  Thanks   [name]  ----- Message ----- From: [name], RN Sent: 08/30/2020  12:13 PM EDT To: [name], MD  Her PET scan results are back.  Looks like bone mets??    [name]  ----- Message ----- From: [name], MD Sent: 08/26/2020   3:36 PM EDT To: [name], RN, [name], MD  Got it, thanks much Linna Hoff.  I will watch her PET scan result and let you know.   [name]  ----- Message ----- From: [name], MD Sent: 08/26/2020   2:24 PM EDT To: [name], MD  [name], I just completed her EGD.  - There were no overtly neoplastic findings. - There was mild, non-specific gastritis and bulbar duodenitis. Both sites were biopsied given large pelvic mass pathology.   She will probably need expedited colonoscopy pending these path results and PET scan (Monday) findings.  I will get her in very quickly for  colonoscopy if PET does not take [place] elsewhere.

## 2020-08-31 NOTE — Progress Notes (Signed)
[place]   [phone] [phone]   Clinic Follow up Note   Patient Care Team: Lorrene Reid, PA-C as PCP - General Christene Lye, MD (General Surgery) [facility], [place] [name], MD as Consulting Physician (Hematology and Oncology) [name], RN as Oncology Nurse Navigator [name], MD as Consulting Physician (Gynecologic Oncology)   I connected with [name] on 08/31/2020 at 12:40 PM EDT by telephone visit and verified that I am speaking with the correct person using two identifiers.  I discussed the limitations, risks, security and privacy concerns of performing an evaluation and management service by telephone and the availability of in person appointments. I also discussed with the patient that there may be a patient responsible charge related to this service. The patient expressed understanding and agreed to proceed.   Other persons participating in the visit and their role in the encounter:  None  Patient's location:  Her home  Provider's location:  [facility]  CHIEF COMPLAINT: Metastatic Adenocarcinoma with unknown primary   SUMMARY OF ONCOLOGIC HISTORY: Oncology History Overview Note  Cancer Staging No matching staging information was found for the patient.    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 Large hypervascular soft tissue mass occupies the pelvis, compressing the urinary bladder and GI structures. No definite connection of this mass is seen to the vaginal cuff. The ovaries are not identified. 2. Enlarged left adrenal gland. Given the presence of soft tissue mass in the pelvis, this may represent metastatic disease to the adrenal gland. 3. Further evaluation with MRI of the abdomen and pelvis may be considered. Surgical consultation is also advised. 4. Right lower pole  nonobstructive nephrolithiasis. 5. Aortic atherosclerosis.     08/09/2020 Procedure   Exploratory laparotomy with right salpingo-oophorectomy, omentectomy washings, peritoneal biopsies, and omentectomy under the care of Dr. Berline Lopes    08/09/2020 Pathology Results   Clinical History: Pelvic mass ([facility])      FINAL MICROSCOPIC DIAGNOSIS:   A. ADNEXAL MASS, RIGHT, SALPINGO OOPHORECTOMY:  - Adenocarcinoma with signet ring cell features.  - No ovarian surface involvement identified.  - Benign Fallopian tube.  - See comment.   The right adnexal mass is 17.8 cm in greatest dimension and is diffusely  involved by adenocarcinoma with diffuse signet ring cell features.  Immunohistochemistry shows positivity with cytokeratin 7 and cytokeratin  20.  The tumor is negative with estrogen receptor, GATA3, GCDFP, CD56,  chromogranin, synaptic ficin, calretinin, inhibin, Napsin A, TTF-1, WT1  and PAX 8.  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  Possible primary sites  include upper gastrointestinal tract and pancreas.   08/19/2020 Initial Diagnosis   Adenocarcinoma ([place])   08/26/2020 Procedure   Upper Endoscopy by Dr Ardis Hughs  IMPRESSION - There were no overtly neoplastic findings. - There was mild, non-specific gastritis and bulbar duodenitis. Both sites were biopsied given large pelvic mass pathology.   08/29/2020 PET scan   IMPRESSION: 1. Dominant finding is multifocal hypermetabolic skeletal metastasis. Lesions are subtly evident on CT with subtle sclerosis. Lesions include the pelvis, spine and ribs. 2. Hypermetabolic metastasis in the RIGHT femoral neck with sclerosis may be at risk for pathologic fracture. 3. Resection of large pelvic mass. No residual carcinoma evident within the pelvis. 4. Benign LEFT adrenal adenoma. 5. Diffuse mild metabolic activity in  the gastric fundus and proximal body is nonspecific. Recommend  correlation with recent upper endoscopy.        CURRENT THERAPY:  PENDING FOLFOX q2weeks   INTERVAL HISTORY:  Sheryl Pratt presents for a virtual follow up.     REVIEW OF SYSTEMS:   Constitutional: Denies fevers, chills or abnormal weight loss Eyes: Denies blurriness of vision Ears, nose, mouth, throat, and face: Denies mucositis or sore throat Respiratory: Denies cough, dyspnea or wheezes Cardiovascular: Denies palpitation, chest discomfort or lower extremity swelling Gastrointestinal:  Denies nausea, heartburn or change in bowel habits Skin: Denies abnormal skin rashes Lymphatics: Denies new lymphadenopathy or easy bruising Neurological:Denies numbness, tingling or new weaknesses Behavioral/Psych: Mood is stable, no new changes  All other systems were reviewed with the patient and are negative.  MEDICAL HISTORY:  Past Medical History:  Diagnosis Date  . Allergy   . Arthritis   . Bloating   . Breast mass, right   . Cancer ([place])   . Constipation   . Migraine   . Pelvic mass   . PONV (postoperative nausea and vomiting)   . Seasonal allergies   . Vitamin D deficiency     SURGICAL HISTORY: Past Surgical History:  Procedure Laterality Date  . APPENDECTOMY    . AUGMENTATION MAMMAPLASTY Bilateral   . BREAST BIOPSY Right 2014   fibroadenoma  . BREAST SURGERY  18 years ago   augmentation.  Marland Kitchen LAPAROTOMY  08/09/2020   Procedure: EXPLORATORY LAPAROTOMY, MASS EXCISION, PERITONEAL BIOPSIES;  Surgeon: [name], MD;  Location: [facility];  Service: Gynecology;;  . OMENTECTOMY N/A 08/09/2020   Procedure: OMENTECTOMY;  Surgeon: [name], MD;  Location: [facility];  Service: Gynecology;  Laterality: N/A;  . SALPINGOOPHORECTOMY Right 08/09/2020   Procedure: OPEN RIGHT SALPINGO OOPHORECTOMY;  Surgeon: [name], MD;  Location: [facility];  Service: Gynecology;  Laterality: Right;  Marland Kitchen VAGINAL HYSTERECTOMY  2015    I have reviewed the social history and family  history with the patient and they are unchanged from previous note.  ALLERGIES:  is allergic to codeine and wellbutrin [bupropion].  MEDICATIONS:  Current Outpatient Medications  Medication Sig Dispense Refill  . cholecalciferol (VITAMIN D3) 25 MCG (1000 UNIT) tablet Take 1,000 Units by mouth daily.    . diphenhydrAMINE (BENADRYL) 25 mg capsule Take 25 mg by mouth at bedtime as needed for allergies.    Marland Kitchen enoxaparin (LOVENOX) 40 MG/0.4ML injection Inject 0.4 mLs (40 mg total) into the skin daily for 27 days. 10.8 mL 0  . ibuprofen (ADVIL) 800 MG tablet Take 1 tablet (800 mg total) by mouth every 8 (eight) hours as needed for moderate pain. For AFTER surgery only 30 tablet 0  . Ibuprofen-diphenhydrAMINE HCl (IBUPROFEN PM) 200-25 MG CAPS Take 1 tablet by mouth at bedtime as needed (sleep).    Marland Kitchen ipratropium (ATROVENT) 0.03 % nasal spray Place 2 sprays into both nostrils every 12 (twelve) hours. (Patient not taking: Reported on 08/26/2020) 30 mL 0  . Multiple Vitamin (MULTIVITAMIN) tablet Take 1 tablet by mouth daily.    Marland Kitchen oxyCODONE (OXY IR/ROXICODONE) 5 MG immediate release tablet Take 1 tablet (5 mg total) by mouth every 4 (four) hours as needed for severe pain. For severe pain only, do not take and drive 10 tablet 0  . Psyllium (METAMUCIL PO) Take 1 Dose by mouth daily. (Patient not taking: Reported on 08/26/2020)    . senna-docusate (SENOKOT-S) 8.6-50 MG tablet Take 2 tablets by mouth at  bedtime. Do not take if having diarrhea (Patient taking differently: Take 2 tablets by mouth daily. Do not take if having diarrhea) 60 tablet 0  . Tetrahydrozoline HCl (VISINE OP) Place 1 drop into both eyes daily as needed (itching /redness).    . traMADol (ULTRAM) 50 MG tablet Take 1 tablet (50 mg total) by mouth every 6 (six) hours as needed for severe pain. For AFTER surgery, do not take and drive 10 tablet 0  . vitamin B-12 (CYANOCOBALAMIN) 1000 MCG tablet Take 1,000 mcg by mouth daily.    . vitamin C (ASCORBIC  ACID) 500 MG tablet Take 500 mg by mouth daily.    . Vitamin D, Ergocalciferol, (DRISDOL) 1.25 MG (50000 UT) CAPS capsule Take one tablet wkly 12 capsule 3  . vitamin E 180 MG (400 UNITS) capsule Take 400 Units by mouth daily.     No current facility-administered medications for this visit.    PHYSICAL EXAMINATION: ECOG PERFORMANCE STATUS: 1 - Symptomatic but completely ambulatory  No vitals taken today, Exam not performed today   LABORATORY DATA:  I have reviewed the data as listed CBC Latest Ref Rng & Units 08/10/2020 08/09/2020 07/30/2020  WBC 4.0 - 10.5 K/uL 8.7 6.3 7.2  Hemoglobin 12.0 - 15.0 g/dL 9.6(L) 11.7(L) 12.2  Hematocrit 36.0 - 46.0 % 29.0(L) 35.8(L) 37.2  Platelets 150 - 400 K/uL 222 256 260     CMP Latest Ref Rng & Units 08/10/2020 08/09/2020 07/30/2020  Glucose 70 - 99 mg/dL 145(H) 85 96  BUN 6 - 20 mg/dL _0 Creatinine 0.44 - 1.00 mg/dL 0.56 0.55 0.48  Sodium 135 - 145 mmol/L 135 138 137  Potassium 3.5 - 5.1 mmol/L 4.3 4.0 3.8  Chloride 98 - 111 mmol/L 107 107 108  CO2 22 - 32 mmol/L 23 21(L) 22  Calcium 8.9 - 10.3 mg/dL 7.8(L) 8.6(L) 8.8(L)  Total Protein 6.5 - 8.1 g/dL - - 6.6  Total Bilirubin 0.3 - 1.2 mg/dL - - 0.2(L)  Alkaline Phos 38 - 126 U/L - - 60  AST 15 - 41 U/L - - 15  ALT 0 - 44 U/L - - 9      RADIOGRAPHIC STUDIES: I have personally reviewed the radiological images as listed and agreed with the findings in the report. No results found.   ASSESSMENT & PLAN:  Sheryl Pratt is a 55 y.o. female with    1. 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 Enlarged left adrenal gland.    -She had tumor markers with a CEA (2.3), Ca125 (34.9) , and a CA 19-9 (5) that were within normal limits. -On 08/09/20, she underwent diagnostic laparoscopy with rightsalpingo-oophorectomy,  omentectomy washings, peritoneal biopsies, and omentectomy to assure the origin of the mass. The large ovarian mass showed adenocarcinoma with signet ring cell features, IHC studies liver upper GI primary. Biopsy of other pelvic tissue was negative for metastasis. -She underwent EGD with Dr. Ardis Hughs on 08/26/20. Final pathology is still pending, but no gross malignancy identified in the upper GI tract. I recommend proceeding with colonoscopy for further evaluation. She is agreeable.   -Her 08/29/20 PET showed multifocal hypermetabolic skeletal metastasis, Hypermetabolic metastasis in the RIGHT femoral neck with sclerosis may be at risk for pathologic fracture, no other primary or metastatic disease.  -I discussed findings with Dr Berline Lopes. Ovarian cancer does  not commonly spread to bone. Overall this is still suspicious for upper GI cancer that has spread to ovary and bone.  Unfortunate this is incurable disease. -[facility] genomic testing results still pending.  I may consider tissue origin test also  -I recommend systemic treatment with chemotherapy FOLFOX, which is the most common regiment for upper GI malignancy.  --Chemotherapy consent: Side effects including but does not limited to, fatigue, nausea, vomiting, diarrhea, hair loss, neuropathy, fluid retention, renal and kidney dysfunction, neutropenic fever, needed for blood transfusion, bleeding, were discussed with patient in great detail. She agrees to proceed. -Goal of therapy is disease control -If he has PD-L1 expression, I will add nivolumab also. -Will proceed with chemo education class and PAC placement before start of treatment. She is agreeable.  -Based on pending FO results, may add additional treatment to her chemo.  -Pt saw Dr. Earnestine Mealing at [facility] yesterday and I have spoken with him today.  -F/u with start of first treatment.      2. Bulging near abdominal incision/Post Op Pain -We reached out to [place] who reviewed the imaging.  GynOnc believes this is likely normal. They directly reached out to the patient and reassured her this was normal. She will f/u with them.  -The patient will continue to take NSAIDs for pain control. If needed, she has a prescription for tramadol and oxycodone  3. Family History of Cancer  -The patient's brother had pancreatic cancer, diagnosed at age 13. She notes unknown cancer in another family member.  -She is interested in genetic testing given her son and daughter. I will send referral (08/31/20)  4. Weight loss -The patient lost ~10 lbs since her surgery.  -I previously offered nutritionist referral, which the patient politely declined.  -I encouraged a high protein high calorie diet to gain and maintain weight.   5. Constipation -The patient has been drinking prune juice and using metamucil -I reviewed constipation eduction with the patient. Discussed the goal is to have a bowel movement daily or every other day. Drinking plenty of fluids, eating fruits and vegetables, and being active helps reduce the risk of constipation. Also advised she may try stool softeners. If she has not had a bowel movement in 2 or more days, then she was encouraged to pick up a laxative.    PLAN: -Send genetic referral  -Colonoscopy with Dr Ardis Hughs on 09/02/20.  -Chemo education class next week  -PAC placement in 1-2 weeks by Dr. Berline Lopes or IR   -Lab, flush, F/u and FOLFOX in 1 and 3 weeks    No problem-specific Assessment & Plan notes found for this encounter.   Orders Placed This Encounter  Procedures  . Ambulatory referral to Genetics    Referral Priority:   Routine    Referral Type:   Consultation    Referral Reason:   Specialty Services Required    Number of Visits Requested:   1   I discussed the assessment and treatment plan with the patient. The patient was provided an opportunity to ask questions and all were answered. The patient agreed with the plan and demonstrated an understanding  of the instructions.  The patient was advised to call back or seek an in-person evaluation if the symptoms worsen or if the condition fails to improve as anticipated.  The total time spent in the appointment was 30 minutes.    [name], MD 08/31/2020   I, Joslyn Devon, am acting as scribe for [name], MD.  I have reviewed the above documentation for accuracy and completeness, and I agree with the above.       

## 2020-09-01 ENCOUNTER — Telehealth: Payer: Self-pay | Admitting: Hematology

## 2020-09-01 ENCOUNTER — Other Ambulatory Visit: Payer: Self-pay

## 2020-09-01 ENCOUNTER — Ambulatory Visit: Payer: 59 | Admitting: Physician Assistant

## 2020-09-01 DIAGNOSIS — C801 Malignant (primary) neoplasm, unspecified: Secondary | ICD-10-CM

## 2020-09-01 DIAGNOSIS — C7961 Secondary malignant neoplasm of right ovary: Secondary | ICD-10-CM

## 2020-09-01 NOTE — Telephone Encounter (Signed)
Scheduled appt per 5/12 sch msg. Pt aware.  

## 2020-09-02 ENCOUNTER — Encounter: Payer: Self-pay | Admitting: Gastroenterology

## 2020-09-02 ENCOUNTER — Ambulatory Visit (AMBULATORY_SURGERY_CENTER): Payer: 59 | Admitting: Gastroenterology

## 2020-09-02 ENCOUNTER — Encounter: Payer: Self-pay | Admitting: Hematology

## 2020-09-02 ENCOUNTER — Other Ambulatory Visit: Payer: Self-pay

## 2020-09-02 ENCOUNTER — Ambulatory Visit: Payer: 59 | Admitting: Gynecologic Oncology

## 2020-09-02 ENCOUNTER — Other Ambulatory Visit: Payer: Self-pay | Admitting: Radiology

## 2020-09-02 VITALS — BP 103/66 | HR 58 | Temp 97.7°F | Resp 14 | Ht 68.0 in | Wt 116.0 lb

## 2020-09-02 DIAGNOSIS — D124 Benign neoplasm of descending colon: Secondary | ICD-10-CM | POA: Diagnosis not present

## 2020-09-02 DIAGNOSIS — C799 Secondary malignant neoplasm of unspecified site: Secondary | ICD-10-CM | POA: Diagnosis not present

## 2020-09-02 MED ORDER — SODIUM CHLORIDE 0.9 % IV SOLN
500.0000 mL | Freq: Once | INTRAVENOUS | Status: DC
Start: 2020-09-02 — End: 2020-09-02

## 2020-09-02 NOTE — Progress Notes (Signed)
VS done by CW 

## 2020-09-02 NOTE — Progress Notes (Signed)
Report to PACU, RN, vss, BBS= Clear.  

## 2020-09-02 NOTE — Patient Instructions (Signed)
YOU HAD AN ENDOSCOPIC PROCEDURE TODAY AT THE [facility]:   Refer to the procedure report that was given to you for any specific questions about what was found during the examination.  If the procedure report does not answer your questions, please call your gastroenterologist to clarify.  If you requested that your care partner not be given the details of your procedure findings, then the procedure report has been included in a sealed envelope for you to review at your convenience later.  YOU SHOULD EXPECT: Some feelings of bloating in the abdomen. Passage of more gas than usual.  Walking can help get rid of the air that was put into your GI tract during the procedure and reduce the bloating. If you had a lower endoscopy (such as a colonoscopy or flexible sigmoidoscopy) you may notice spotting of blood in your stool or on the toilet paper. If you underwent a bowel prep for your procedure, you may not have a normal bowel movement for a few days.  Please Note:  You might notice some irritation and congestion in your nose or some drainage.  This is from the oxygen used during your procedure.  There is no need for concern and it should clear up in a day or so.  SYMPTOMS TO REPORT IMMEDIATELY:   Following lower endoscopy (colonoscopy or flexible sigmoidoscopy):  Excessive amounts of blood in the stool  Significant tenderness or worsening of abdominal pains  Swelling of the abdomen that is new, acute  Fever of 100F or higher   Following upper endoscopy (EGD)  Vomiting of blood or coffee ground material  New chest pain or pain under the shoulder blades  Painful or persistently difficult swallowing  New shortness of breath  Fever of 100F or higher  Black, tarry-looking stools  For urgent or emergent issues, a gastroenterologist can be reached at any hour by calling [phone]. Do not use MyChart messaging for urgent concerns.    DIET:  We do recommend a small meal at first, but  then you may proceed to your regular diet.  Drink plenty of fluids but you should avoid alcoholic beverages for 24 hours.  ACTIVITY:  You should plan to take it easy for the rest of today and you should NOT DRIVE or use heavy machinery until tomorrow (because of the sedation medicines used during the test).    FOLLOW UP: Our staff will call the number listed on your records 48-72 hours following your procedure to check on you and address any questions or concerns that you may have regarding the information given to you following your procedure. If we do not reach you, we will leave a message.  We will attempt to reach you two times.  During this call, we will ask if you have developed any symptoms of COVID 19. If you develop any symptoms (ie: fever, flu-like symptoms, shortness of breath, cough etc.) before then, please call [phone].  If you test positive for Covid 19 in the 2 weeks post procedure, please call and report this information to us.    If any biopsies were taken you will be contacted by phone or by letter within the next 1-3 weeks.  Please call us at [phone] if you have not heard about the biopsies in 3 weeks.    SIGNATURES/CONFIDENTIALITY: You and/or your care partner have signed paperwork which will be entered into your electronic medical record.  These signatures attest to the fact that that the information above on   your After Visit Summary has been reviewed and is understood.  Full responsibility of the confidentiality of this discharge information lies with you and/or your care-partner. 

## 2020-09-02 NOTE — Progress Notes (Signed)
Called to room to assist during endoscopic procedure.  Patient ID and intended procedure confirmed with present staff. Received instructions for my participation in the procedure from the performing physician.  

## 2020-09-02 NOTE — Op Note (Addendum)
[place] Patient Name: Sheryl Pratt Procedure Date: 09/02/2020 7:33 AM MRN: [id number] Endoscopist: [name] , MD Age: 55 Referring MD:  Date of Birth: 16-Sep-1965 Gender: Female Account #: [id number] Procedure:                Colonoscopy Indications:              Screening for colorectal malignant neoplasm; recent                            resection of large pelvic mass (adenocarcinoma) Medicines:                Monitored Anesthesia Care Procedure:                Pre-Anesthesia Assessment:                           - Prior to the procedure, a History and Physical                            was performed, and patient medications and                            allergies were reviewed. The patient's tolerance of                            previous anesthesia was also reviewed. The risks                            and benefits of the procedure and the sedation                            options and risks were discussed with the patient.                            All questions were answered, and informed consent                            was obtained. Prior Anticoagulants: The patient has                            taken Lovenox (enoxaparin), last dose was 2 days                            prior to procedure. ASA Grade Assessment: III - A                            patient with severe systemic disease. After                            reviewing the risks and benefits, the patient was                            deemed in satisfactory condition to undergo the  procedure.                           After obtaining informed consent, the colonoscope                            was passed under direct vision. Throughout the                            procedure, the patient's blood pressure, pulse, and                            oxygen saturations were monitored continuously. The                            Olympus [id number]-[id number] (#[id number]) Colonoscope was                             introduced through the anus and advanced to the the                            cecum, identified by appendiceal orifice and                            ileocecal valve. The colonoscopy was performed                            without difficulty. The patient tolerated the                            procedure well. Scope In: 7:35:50 AM Scope Out: 7:50:53 AM Scope Withdrawal Time: 0 hours 10 minutes 48 seconds  Total Procedure Duration: 0 hours 15 minutes 3 seconds  Findings:                 A 3 mm polyp was found in the descending colon. The                            polyp was sessile. The polyp was removed with a                            cold snare. Polyp resection was incomplete, and the                            resected tissue was not retrieved.                           Internal hemorrhoids were found. The hemorrhoids                            were small.                           The exam was otherwise without abnormality on  direct and retroflexion views. Complications:            No immediate complications. Estimated blood loss:                            None. Estimated Blood Loss:     Estimated blood loss: none. Impression:               - One 3 mm polyp in the descending colon, removed                            with a cold snare. Incomplete resection. Resected                            tissue not retrieved.                           - Internal hemorrhoids.                           - The examination was otherwise normal on direct                            and retroflexion views. Recommendation:           - Patient has a contact number available for                            emergencies. The signs and symptoms of potential                            delayed complications were discussed with the                            patient. Return to normal activities tomorrow.                            Written discharge  instructions were provided to the                            patient.                           - Resume previous diet.                           - Continue present medications. [name], MD 09/02/2020 8:00:57 AM

## 2020-09-05 ENCOUNTER — Other Ambulatory Visit: Payer: Self-pay | Admitting: Student

## 2020-09-05 ENCOUNTER — Inpatient Hospital Stay: Payer: 59

## 2020-09-05 ENCOUNTER — Inpatient Hospital Stay (HOSPITAL_BASED_OUTPATIENT_CLINIC_OR_DEPARTMENT_OTHER): Payer: 59 | Admitting: Gynecologic Oncology

## 2020-09-05 ENCOUNTER — Encounter: Payer: Self-pay | Admitting: Gynecologic Oncology

## 2020-09-05 ENCOUNTER — Encounter: Payer: Self-pay | Admitting: Hematology

## 2020-09-05 ENCOUNTER — Other Ambulatory Visit: Payer: Self-pay

## 2020-09-05 ENCOUNTER — Inpatient Hospital Stay (HOSPITAL_BASED_OUTPATIENT_CLINIC_OR_DEPARTMENT_OTHER): Payer: 59 | Admitting: Hematology

## 2020-09-05 VITALS — BP 100/69 | HR 73 | Ht 68.0 in | Wt 116.0 lb

## 2020-09-05 VITALS — BP 99/63 | HR 66 | Temp 98.0°F | Resp 18 | Wt 118.8 lb

## 2020-09-05 DIAGNOSIS — Z5111 Encounter for antineoplastic chemotherapy: Secondary | ICD-10-CM | POA: Diagnosis not present

## 2020-09-05 DIAGNOSIS — K5900 Constipation, unspecified: Secondary | ICD-10-CM | POA: Diagnosis not present

## 2020-09-05 DIAGNOSIS — Z452 Encounter for adjustment and management of vascular access device: Secondary | ICD-10-CM | POA: Diagnosis not present

## 2020-09-05 DIAGNOSIS — C7961 Secondary malignant neoplasm of right ovary: Secondary | ICD-10-CM | POA: Diagnosis present

## 2020-09-05 DIAGNOSIS — C801 Malignant (primary) neoplasm, unspecified: Secondary | ICD-10-CM

## 2020-09-05 DIAGNOSIS — C169 Malignant neoplasm of stomach, unspecified: Secondary | ICD-10-CM

## 2020-09-05 DIAGNOSIS — C7951 Secondary malignant neoplasm of bone: Secondary | ICD-10-CM | POA: Diagnosis not present

## 2020-09-05 DIAGNOSIS — G8918 Other acute postprocedural pain: Secondary | ICD-10-CM | POA: Diagnosis not present

## 2020-09-05 DIAGNOSIS — Z9889 Other specified postprocedural states: Secondary | ICD-10-CM

## 2020-09-05 DIAGNOSIS — R634 Abnormal weight loss: Secondary | ICD-10-CM | POA: Diagnosis not present

## 2020-09-05 DIAGNOSIS — R1900 Intra-abdominal and pelvic swelling, mass and lump, unspecified site: Secondary | ICD-10-CM

## 2020-09-05 LAB — CBC WITH DIFFERENTIAL (CANCER CENTER ONLY)
Abs Immature Granulocytes: 0.01 10*3/uL (ref 0.00–0.07)
Basophils Absolute: 0 10*3/uL (ref 0.0–0.1)
Basophils Relative: 0 %
Eosinophils Absolute: 0.4 10*3/uL (ref 0.0–0.5)
Eosinophils Relative: 5 %
HCT: 34.2 % — ABNORMAL LOW (ref 36.0–46.0)
Hemoglobin: 11.5 g/dL — ABNORMAL LOW (ref 12.0–15.0)
Immature Granulocytes: 0 %
Lymphocytes Relative: 40 %
Lymphs Abs: 3 10*3/uL (ref 0.7–4.0)
MCH: 32 pg (ref 26.0–34.0)
MCHC: 33.6 g/dL (ref 30.0–36.0)
MCV: 95.3 fL (ref 80.0–100.0)
Monocytes Absolute: 0.5 10*3/uL (ref 0.1–1.0)
Monocytes Relative: 7 %
Neutro Abs: 3.5 10*3/uL (ref 1.7–7.7)
Neutrophils Relative %: 48 %
Platelet Count: 248 10*3/uL (ref 150–400)
RBC: 3.59 MIL/uL — ABNORMAL LOW (ref 3.87–5.11)
RDW: 12.8 % (ref 11.5–15.5)
WBC Count: 7.3 10*3/uL (ref 4.0–10.5)
nRBC: 0 % (ref 0.0–0.2)

## 2020-09-05 LAB — CMP (CANCER CENTER ONLY)
ALT: 15 U/L (ref 0–44)
AST: 14 U/L — ABNORMAL LOW (ref 15–41)
Albumin: 3.8 g/dL (ref 3.5–5.0)
Alkaline Phosphatase: 220 U/L — ABNORMAL HIGH (ref 38–126)
Anion gap: 9 (ref 5–15)
BUN: 17 mg/dL (ref 6–20)
CO2: 27 mmol/L (ref 22–32)
Calcium: 9.2 mg/dL (ref 8.9–10.3)
Chloride: 102 mmol/L (ref 98–111)
Creatinine: 0.66 mg/dL (ref 0.44–1.00)
GFR, Estimated: >60 mL/min (ref >60)
Glucose, Bld: 142 mg/dL — ABNORMAL HIGH (ref 70–99)
Potassium: 4.1 mmol/L (ref 3.5–5.1)
Sodium: 138 mmol/L (ref 135–145)
Total Bilirubin: <0.2 mg/dL — ABNORMAL LOW (ref 0.3–1.2)
Total Protein: 7.5 g/dL (ref 6.5–8.1)

## 2020-09-05 LAB — SURGICAL PATHOLOGY

## 2020-09-05 NOTE — Patient Instructions (Signed)
You are healing well from surgery!  Remember, no lifting more than 10-15 pounds until you are 6 weeks out from surgery.  At the 6-week mark, you can slowly start increasing your activity.  Your incision is healing very well.  The lumpiness at the top portion of the incision should improve with time.  Please do not hesitate to reach out to me if you need anything in the future.

## 2020-09-05 NOTE — Progress Notes (Signed)
Gynecologic Oncology Return Clinic Visit  09/05/20  Reason for Visit: post-op follow-up  Treatment History: Oncology History Overview Note  Cancer Staging No matching staging information was found for the patient.    Gastric adenocarcinoma ([place])  07/30/2020 Imaging   CT Abdomen and Pelvis with Contrast  IMPRESSION: 1. Large hypervascular soft tissue mass occupies the pelvis, compressing the urinary bladder and GI structures. No definite connection of this mass is seen to the vaginal cuff. The ovaries are not identified. 2. Enlarged left adrenal gland. Given the presence of soft tissue mass in the pelvis, this may represent metastatic disease to the adrenal gland. 3. Further evaluation with MRI of the abdomen and pelvis may be considered. Surgical consultation is also advised. 4. Right lower pole nonobstructive nephrolithiasis. 5. Aortic atherosclerosis.     08/09/2020 Procedure   Exploratory laparotomy with right salpingo-oophorectomy, omentectomy washings, peritoneal biopsies, and omentectomy under the care of Dr. Berline Lopes    08/09/2020 Pathology Results   Clinical History: Pelvic mass ([facility])      FINAL MICROSCOPIC DIAGNOSIS:   A. ADNEXAL MASS, RIGHT, SALPINGO OOPHORECTOMY:  - Adenocarcinoma with signet ring cell features.  - No ovarian surface involvement identified.  - Benign Fallopian tube.  - See comment.   The right adnexal mass is 17.8 cm in greatest dimension and is diffusely  involved by adenocarcinoma with diffuse signet ring cell features.  Immunohistochemistry shows positivity with cytokeratin 7 and cytokeratin  20.  The tumor is negative with estrogen receptor, GATA3, GCDFP, CD56,  chromogranin, synaptic ficin, calretinin, inhibin, Napsin A, TTF-1, WT1  and PAX 8.  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  Possible primary sites  include upper gastrointestinal tract and pancreas.   08/19/2020  Initial Diagnosis   Adenocarcinoma ([place])   08/26/2020 Procedure   Upper Endoscopy by Dr Ardis Hughs  IMPRESSION - There were no overtly neoplastic findings. - There was mild, non-specific gastritis and bulbar duodenitis. Both sites were biopsied given large pelvic mass pathology.   08/26/2020 Pathology Results   Surgical [P], gastric antrum and gastric body bx - POORLY DIFFERENTIATED ADENOCARCINOMA WITH SIGNET RING FEATURES. - SEE MICROSCOPIC DESCRIPTION.    08/29/2020 PET scan   IMPRESSION: 1. Dominant finding is multifocal hypermetabolic skeletal metastasis. Lesions are subtly evident on CT with subtle sclerosis. Lesions include the pelvis, spine and ribs. 2. Hypermetabolic metastasis in the RIGHT femoral neck with sclerosis may be at risk for pathologic fracture. 3. Resection of large pelvic mass. No residual carcinoma evident within the pelvis. 4. Benign LEFT adrenal adenoma. 5. Diffuse mild metabolic activity in the gastric fundus and proximal body is nonspecific. Recommend correlation with recent upper endoscopy.     09/08/2020 -  Chemotherapy    Patient is on Treatment Plan: COLORECTAL FOLFOX Q14D        Interval History: Patient presents today for postoperative follow-up.  She is overall doing well although has had a difficult couple of weeks with her new gastric adenocarcinoma diagnosis.  She endorses a good appetite without nausea or emesis.  She reports bowel function has returned to normal with the aid of prunes.  She denies any urinary symptoms but notes that the urinary frequency she was having before surgery has resolved.  She has still some soreness at the top of her incision as well as swelling but feels as though both of these are improving.  Past Medical/Surgical History: Past Medical History:  Diagnosis Date  . Allergy   .  Arthritis   . Bloating   . Breast mass, right   . Cancer ([facility])   . Constipation   . Migraine   . Pelvic mass   . PONV (postoperative  nausea and vomiting)   . Seasonal allergies   . Vitamin D deficiency     Past Surgical History:  Procedure Laterality Date  . APPENDECTOMY    . AUGMENTATION MAMMAPLASTY Bilateral   . BREAST BIOPSY Right 2014   fibroadenoma  . BREAST SURGERY  18 years ago   augmentation.  Marland Kitchen LAPAROTOMY  08/09/2020   Procedure: EXPLORATORY LAPAROTOMY, MASS EXCISION, PERITONEAL BIOPSIES;  Surgeon: [name], MD;  Location: [facility];  Service: Gynecology;;  . OMENTECTOMY N/A 08/09/2020   Procedure: OMENTECTOMY;  Surgeon: [name], MD;  Location: [facility];  Service: Gynecology;  Laterality: N/A;  . SALPINGOOPHORECTOMY Right 08/09/2020   Procedure: OPEN RIGHT SALPINGO OOPHORECTOMY;  Surgeon: [name], MD;  Location: [facility];  Service: Gynecology;  Laterality: Right;  Marland Kitchen VAGINAL HYSTERECTOMY  2015    Family History  Problem Relation Age of Onset  . Cancer Brother        pancreatic  . Ovarian cancer Neg Hx   . Uterine cancer Neg Hx   . Breast cancer Neg Hx   . Colon cancer Neg Hx     Social History   Socioeconomic History  . Marital status: Married    Spouse name: Tawney Breitman  . Number of children: Not on file  . Years of education: Not on file  . Highest education level: Not on file  Occupational History  . Not on file  Tobacco Use  . Smoking status: Current Every Day Smoker    Packs/day: 0.25    Years: 10.00    Pack years: 2.50    Types: Cigarettes  . Smokeless tobacco: Never Used  . Tobacco comment: 1-2 cigs QD  Vaping Use  . Vaping Use: Never used  Substance and Sexual Activity  . Alcohol use: Yes    Comment: occasional  . Drug use: No  . Sexual activity: Yes    Birth control/protection: Surgical  Other Topics Concern  . Not on file  Social History Narrative  . Not on file   Social Determinants of Health   Financial Resource Strain: Not on file  Food Insecurity: Not on file  Transportation Needs: Not on file  Physical Activity: Not on file   Stress: Not on file  Social Connections: Not on file    Current Medications:  Current Outpatient Medications:  .  cholecalciferol (VITAMIN D3) 25 MCG (1000 UNIT) tablet, Take 1,000 Units by mouth daily., Disp: , Rfl:  .  diphenhydrAMINE (BENADRYL) 25 mg capsule, Take 25 mg by mouth at bedtime as needed for allergies., Disp: , Rfl:  .  enoxaparin (LOVENOX) 40 MG/0.4ML injection, Inject 0.4 mLs (40 mg total) into the skin daily for 27 days., Disp: 10.8 mL, Rfl: 0 .  ibuprofen (ADVIL) 800 MG tablet, Take 1 tablet (800 mg total) by mouth every 8 (eight) hours as needed for moderate pain. For AFTER surgery only, Disp: 30 tablet, Rfl: 0 .  Ibuprofen-diphenhydrAMINE HCl (IBUPROFEN PM) 200-25 MG CAPS, Take 1 tablet by mouth at bedtime as needed (sleep)., Disp: , Rfl:  .  ipratropium (ATROVENT) 0.03 % nasal spray, Place 2 sprays into both nostrils every 12 (twelve) hours. (Patient not taking: No sig reported), Disp: 30 mL, Rfl: 0 .  lidocaine-prilocaine (EMLA) cream, Apply to affected area once (  Patient not taking: Reported on 09/02/2020), Disp: 30 g, Rfl: 3 .  Multiple Vitamin (MULTIVITAMIN) tablet, Take 1 tablet by mouth daily., Disp: , Rfl:  .  ondansetron (ZOFRAN) 8 MG tablet, Take 1 tablet (8 mg total) by mouth 2 (two) times daily as needed for refractory nausea / vomiting. Start on day 3 after chemotherapy. (Patient not taking: Reported on 09/02/2020), Disp: 30 tablet, Rfl: 1 .  oxyCODONE (OXY IR/ROXICODONE) 5 MG immediate release tablet, Take 1 tablet (5 mg total) by mouth every 4 (four) hours as needed for severe pain. For severe pain only, do not take and drive, Disp: 10 tablet, Rfl: 0 .  prochlorperazine (COMPAZINE) 10 MG tablet, Take 1 tablet (10 mg total) by mouth every 6 (six) hours as needed (Nausea or vomiting). (Patient not taking: Reported on 09/02/2020), Disp: 30 tablet, Rfl: 1 .  Psyllium (METAMUCIL PO), Take 1 Dose by mouth daily. (Patient not taking: No sig reported), Disp: , Rfl:  .   senna-docusate (SENOKOT-S) 8.6-50 MG tablet, Take 2 tablets by mouth at bedtime. Do not take if having diarrhea (Patient taking differently: Take 2 tablets by mouth daily. Do not take if having diarrhea), Disp: 60 tablet, Rfl: 0 .  Tetrahydrozoline HCl (VISINE OP), Place 1 drop into both eyes daily as needed (itching /redness)., Disp: , Rfl:  .  traMADol (ULTRAM) 50 MG tablet, Take 1 tablet (50 mg total) by mouth every 6 (six) hours as needed for severe pain. For AFTER surgery, do not take and drive, Disp: 10 tablet, Rfl: 0 .  vitamin B-12 (CYANOCOBALAMIN) 1000 MCG tablet, Take 1,000 mcg by mouth daily., Disp: , Rfl:  .  vitamin C (ASCORBIC ACID) 500 MG tablet, Take 500 mg by mouth daily., Disp: , Rfl:  .  Vitamin D, Ergocalciferol, (DRISDOL) 1.25 MG (50000 UT) CAPS capsule, Take one tablet wkly, Disp: 12 capsule, Rfl: 3 .  vitamin E 180 MG (400 UNITS) capsule, Take 400 Units by mouth daily., Disp: , Rfl:   Review of Systems: Denies appetite changes, fevers, chills, fatigue, unexplained weight changes. Denies hearing loss, neck lumps or masses, mouth sores, ringing in ears or voice changes. Denies cough or wheezing.  Denies shortness of breath. Denies chest pain or palpitations. Denies leg swelling. Denies abdominal distention, blood in stools, constipation, diarrhea, nausea, vomiting, or early satiety. Denies pain with intercourse, dysuria, frequency, hematuria or incontinence. Denies hot flashes, pelvic pain, vaginal bleeding or vaginal discharge.   Denies joint pain, back pain or muscle pain/cramps. Denies itching, rash, or wounds. Denies dizziness, headaches, numbness or seizures. Denies swollen lymph nodes or glands, denies easy bruising or bleeding. Denies anxiety, depression, confusion, or decreased concentration.  Physical Exam: BP 100/69 (BP Location: Left Arm, Patient Position: Sitting)   Pulse 73   Ht 5\' 8"  (1.727 m)   Wt 116 lb (52.6 kg)   LMP 07/02/2012   SpO2 98%   BMI  17.64 kg/m  General: Alert, oriented, no acute distress. HEENT: Normocephalic, atraumatic, sclera anicteric. Chest: Unlabored breathing on room air. Abdomen: soft, nontender.  Normoactive bowel sounds.  No masses or hepatosplenomegaly appreciated.  Well-healing midline laparotomy incision.  Remaining Dermabond removed.  There is some soft tissue changes still related to surgery causing swelling especially on the right upper aspect of the incision.  There is no associated erythema or significant tenderness with palpation of this.  The incision itself is clean dry and intact and healing well. Extremities: Grossly normal range of motion.  Warm, well perfused.  No edema bilaterally.  Laboratory & Radiologic Studies: A. ADNEXAL MASS, RIGHT, SALPINGO OOPHORECTOMY:  - Adenocarcinoma with signet ring cell features.  - No ovarian surface involvement identified.  - Benign Fallopian tube.  - See comment.   B. PELVIC SIDEWALL, RIGHT, BIOPSY:  - Benign peritoneum and subperitoneal connective tissue.  - No malignancy identified.   C. CUL DE SAC, ANTERIOR, BIOPSY:  - Benign peritoneum and f connective tissue.  - No malignancy identified.   D. PELVIC SIDEWALL, LEFT, BIOPSY:  - Benign peritoneum and subperitoneal connective tissue.  - No malignancy identified.   E. OMENTUM:  - Benign adipose tissue consistent with omentum.  - No malignancy identified.   F. PARA COLIC GUTTER, RIGHT, BIOPSY:  - Benign peritoneum and subperitoneal connective tissue.  - No malignancy identified.   G. PARA COLIC GUTTER, LEFT, BIOPSY:  - Benign peritoneum and subperitoneal connective tissue.  - No malignancy identified.   H. VAGINAL CUFF:  - Benign Fallopian tube tissue.  - No malignancy identified  Assessment & Plan: Sheryl Pratt is a 55 y.o. woman with metastatic poorly differentiated gastric cancer now s/p robotic RSO, peritoneal biopsies and omentectomy for pelvic mass c/w metastatic disease on final  pathology. Primary now identified after endoscopy.  Patient is overall doing well from a postoperative standpoint.  We discussed continued activity restrictions and postoperative healing expectations.  In terms of her incision, I think due to her low weight, the soft tissue changes related to the abdominal wall closure are especially prominent.  These have improved over the last week or 2 and I suspect they will continue to improve with time.  The patient is now under the care of Dr. Burr Medico and will be starting treatment for metastatic gastric cancer later this week.  26 minutes of total time was spent for this patient encounter, including preparation, face-to-face counseling with the patient and coordination of care, and documentation of the encounter.  Jeral Pinch, MD  Division of Gynecologic Oncology  Department of Obstetrics and Gynecology  [facility]

## 2020-09-05 NOTE — Progress Notes (Signed)
Met with patient at registration to introduce myself as Financial Resource Specialist and to offer available resources.  Discussed one-time $1000 [employer] grant and qualifications to assist with personal expenses while going through treatment.  Gave her my card if interested in applying and for any additional financial questions or concerns.  

## 2020-09-05 NOTE — Progress Notes (Signed)
[place]   [phone] [phone]   Clinic Follow up Note   Patient Care Team: Lorrene Reid, PA-C as PCP - General Christene Lye, MD (General Surgery) [facility], [place] [name], MD as Consulting Physician (Hematology and Oncology) [name], RN as Oncology Nurse Navigator [name], MD as Consulting Physician (Gynecologic Oncology)  Date of Service:  09/05/2020  CHIEF COMPLAINT: Metastatic Gastric Adenocarcinoma  SUMMARY OF ONCOLOGIC HISTORY: Oncology History Overview Note  Cancer Staging No matching staging information was found for the patient.    Gastric adenocarcinoma ([place])  07/30/2020 Imaging   CT Abdomen and Pelvis with Contrast  IMPRESSION: 1. Large hypervascular soft tissue mass occupies the pelvis, compressing the urinary bladder and GI structures. No definite connection of this mass is seen to the vaginal cuff. The ovaries are not identified. 2. Enlarged left adrenal gland. Given the presence of soft tissue mass in the pelvis, this may represent metastatic disease to the adrenal gland. 3. Further evaluation with MRI of the abdomen and pelvis may be considered. Surgical consultation is also advised. 4. Right lower pole nonobstructive nephrolithiasis. 5. Aortic atherosclerosis.     08/09/2020 Procedure   Exploratory laparotomy with right salpingo-oophorectomy, omentectomy washings, peritoneal biopsies, and omentectomy under the care of Dr. Berline Lopes    08/09/2020 Pathology Results   Clinical History: Pelvic mass (crm)      FINAL MICROSCOPIC DIAGNOSIS:   A. ADNEXAL MASS, RIGHT, SALPINGO OOPHORECTOMY:  - Adenocarcinoma with signet ring cell features.  - No ovarian surface involvement identified.  - Benign Fallopian tube.  - See comment.   The right adnexal mass is 17.8 cm in greatest dimension and is diffusely  involved by adenocarcinoma with diffuse signet ring cell features.   Immunohistochemistry shows positivity with cytokeratin 7 and cytokeratin  20.  The tumor is negative with estrogen receptor, GATA3, GCDFP, CD56,  chromogranin, synaptic ficin, calretinin, inhibin, Napsin A, TTF-1, WT1  and PAX 8.  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  Possible primary sites  include upper gastrointestinal tract and pancreas.   08/19/2020 Initial Diagnosis   Adenocarcinoma ([place])   08/26/2020 Procedure   Upper Endoscopy by Dr Ardis Hughs  IMPRESSION - There were no overtly neoplastic findings. - There was mild, non-specific gastritis and bulbar duodenitis. Both sites were biopsied given large pelvic mass pathology.   08/26/2020 Pathology Results   Surgical [P], gastric antrum and gastric body bx - POORLY DIFFERENTIATED ADENOCARCINOMA WITH SIGNET RING FEATURES. - SEE MICROSCOPIC DESCRIPTION.    08/29/2020 PET scan   IMPRESSION: 1. Dominant finding is multifocal hypermetabolic skeletal metastasis. Lesions are subtly evident on CT with subtle sclerosis. Lesions include the pelvis, spine and ribs. 2. Hypermetabolic metastasis in the RIGHT femoral neck with sclerosis may be at risk for pathologic fracture. 3. Resection of large pelvic mass. No residual carcinoma evident within the pelvis. 4. Benign LEFT adrenal adenoma. 5. Diffuse mild metabolic activity in the gastric fundus and proximal body is nonspecific. Recommend correlation with recent upper endoscopy.     09/08/2020 -  Chemotherapy   First-line FOLFOX q2weeks starting 09/08/20       CURRENT THERAPY:  First-line FOLFOX q2weeks starting 09/08/20  INTERVAL HISTORY:  Sheryl Pratt is here for a follow up. She presents to the clinic alone. She saw Dr Berline Lopes today after her surgery. She notes pain with flares for several months. She had attributes this to arthritis. She  has been taking OTC pain medication which has helped.     REVIEW OF SYSTEMS:    Constitutional: Denies fevers, chills or abnormal weight loss Eyes: Denies blurriness of vision Ears, nose, mouth, throat, and face: Denies mucositis or sore throat Respiratory: Denies cough, dyspnea or wheezes Cardiovascular: Denies palpitation, chest discomfort or lower extremity swelling Gastrointestinal:  Denies nausea, heartburn or change in bowel habits Skin: Denies abnormal skin rashes Lymphatics: Denies new lymphadenopathy or easy bruising Neurological:Denies numbness, tingling or new weaknesses Behavioral/Psych: Mood is stable, no new changes  All other systems were reviewed with the patient and are negative.  MEDICAL HISTORY:  Past Medical History:  Diagnosis Date  . Allergy   . Arthritis   . Bloating   . Breast mass, right   . Cancer ([place])   . Constipation   . Migraine   . Pelvic mass   . PONV (postoperative nausea and vomiting)   . Seasonal allergies   . Vitamin D deficiency     SURGICAL HISTORY: Past Surgical History:  Procedure Laterality Date  . APPENDECTOMY    . AUGMENTATION MAMMAPLASTY Bilateral   . BREAST BIOPSY Right 2014   fibroadenoma  . BREAST SURGERY  18 years ago   augmentation.  Marland Kitchen LAPAROTOMY  08/09/2020   Procedure: EXPLORATORY LAPAROTOMY, MASS EXCISION, PERITONEAL BIOPSIES;  Surgeon: [name], MD;  Location: [facility];  Service: Gynecology;;  . OMENTECTOMY N/A 08/09/2020   Procedure: OMENTECTOMY;  Surgeon: [name], MD;  Location: [facility];  Service: Gynecology;  Laterality: N/A;  . SALPINGOOPHORECTOMY Right 08/09/2020   Procedure: OPEN RIGHT SALPINGO OOPHORECTOMY;  Surgeon: [name], MD;  Location: [facility];  Service: Gynecology;  Laterality: Right;  Marland Kitchen VAGINAL HYSTERECTOMY  2015    I have reviewed the social history and family history with the patient and they are unchanged from previous note.  ALLERGIES:  is allergic to codeine and wellbutrin [bupropion].  MEDICATIONS:  Current Outpatient Medications  Medication  Sig Dispense Refill  . cholecalciferol (VITAMIN D3) 25 MCG (1000 UNIT) tablet Take 1,000 Units by mouth daily.    . diphenhydrAMINE (BENADRYL) 25 mg capsule Take 25 mg by mouth at bedtime as needed for allergies.    Marland Kitchen enoxaparin (LOVENOX) 40 MG/0.4ML injection Inject 0.4 mLs (40 mg total) into the skin daily for 27 days. 10.8 mL 0  . ibuprofen (ADVIL) 800 MG tablet Take 1 tablet (800 mg total) by mouth every 8 (eight) hours as needed for moderate pain. For AFTER surgery only 30 tablet 0  . Ibuprofen-diphenhydrAMINE HCl (IBUPROFEN PM) 200-25 MG CAPS Take 1 tablet by mouth at bedtime as needed (sleep).    Marland Kitchen ipratropium (ATROVENT) 0.03 % nasal spray Place 2 sprays into both nostrils every 12 (twelve) hours. (Patient not taking: No sig reported) 30 mL 0  . lidocaine-prilocaine (EMLA) cream Apply to affected area once (Patient not taking: Reported on 09/02/2020) 30 g 3  . Multiple Vitamin (MULTIVITAMIN) tablet Take 1 tablet by mouth daily.    . ondansetron (ZOFRAN) 8 MG tablet Take 1 tablet (8 mg total) by mouth 2 (two) times daily as needed for refractory nausea / vomiting. Start on day 3 after chemotherapy. (Patient not taking: Reported on 09/02/2020) 30 tablet 1  . oxyCODONE (OXY IR/ROXICODONE) 5 MG immediate release tablet Take 1 tablet (5 mg total) by mouth every 4 (four) hours as needed for severe pain. For severe pain only, do not take and drive 10 tablet 0  . prochlorperazine (COMPAZINE)  10 MG tablet Take 1 tablet (10 mg total) by mouth every 6 (six) hours as needed (Nausea or vomiting). (Patient not taking: Reported on 09/02/2020) 30 tablet 1  . Psyllium (METAMUCIL PO) Take 1 Dose by mouth daily. (Patient not taking: No sig reported)    . senna-docusate (SENOKOT-S) 8.6-50 MG tablet Take 2 tablets by mouth at bedtime. Do not take if having diarrhea (Patient taking differently: Take 2 tablets by mouth daily. Do not take if having diarrhea) 60 tablet 0  . Tetrahydrozoline HCl (VISINE OP) Place 1 drop  into both eyes daily as needed (itching /redness).    . traMADol (ULTRAM) 50 MG tablet Take 1 tablet (50 mg total) by mouth every 6 (six) hours as needed for severe pain. For AFTER surgery, do not take and drive 10 tablet 0  . vitamin B-12 (CYANOCOBALAMIN) 1000 MCG tablet Take 1,000 mcg by mouth daily.    . vitamin C (ASCORBIC ACID) 500 MG tablet Take 500 mg by mouth daily.    . Vitamin D, Ergocalciferol, (DRISDOL) 1.25 MG (50000 UT) CAPS capsule Take one tablet wkly 12 capsule 3  . vitamin E 180 MG (400 UNITS) capsule Take 400 Units by mouth daily.     No current facility-administered medications for this visit.    PHYSICAL EXAMINATION: ECOG PERFORMANCE STATUS: 1 - Symptomatic but completely ambulatory  Vitals:   09/05/20 1603  BP: 99/63  Pulse: 66  Resp: 18  Temp: 98 F (36.7 C)  SpO2: 98%   Filed Weights   09/05/20 1603  Weight: 118 lb 12.8 oz (53.9 kg)    GENERAL:alert, no distress and comfortable SKIN: skin color, texture, turgor are normal, no rashes or significant lesions EYES: normal, Conjunctiva are pink and non-injected, sclera clear  NECK: supple, thyroid normal size, non-tender, without nodularity LYMPH:  no palpable lymphadenopathy in the cervical, axillary  LUNGS: clear to auscultation and percussion with normal breathing effort HEART: regular rate & rhythm and no murmurs and no lower extremity edema ABDOMEN:abdomen soft, non-tender and normal bowel sounds (+) surgical incision healing well  Musculoskeletal:no cyanosis of digits and no clubbing  NEURO: alert & oriented x 3 with fluent speech, no focal motor/sensory deficits  LABORATORY DATA:  I have reviewed the data as listed CBC Latest Ref Rng & Units 09/05/2020 08/10/2020 08/09/2020  WBC 4.0 - 10.5 K/uL 7.3 8.7 6.3  Hemoglobin 12.0 - 15.0 g/dL 11.5(L) 9.6(L) 11.7(L)  Hematocrit 36.0 - 46.0 % 34.2(L) 29.0(L) 35.8(L)  Platelets 150 - 400 K/uL 248 222 256     CMP Latest Ref Rng & Units 08/10/2020 08/09/2020  07/30/2020  Glucose 70 - 99 mg/dL 145(H) 85 96  BUN 6 - 20 mg/dL '10 10 11  ' Creatinine 0.44 - 1.00 mg/dL 0.56 0.55 0.48  Sodium 135 - 145 mmol/L 135 138 137  Potassium 3.5 - 5.1 mmol/L 4.3 4.0 3.8  Chloride 98 - 111 mmol/L 107 107 108  CO2 22 - 32 mmol/L 23 21(L) 22  Calcium 8.9 - 10.3 mg/dL 7.8(L) 8.6(L) 8.8(L)  Total Protein 6.5 - 8.1 g/dL - - 6.6  Total Bilirubin 0.3 - 1.2 mg/dL - - 0.2(L)  Alkaline Phos 38 - 126 U/L - - 60  AST 15 - 41 U/L - - 15  ALT 0 - 44 U/L - - 9      RADIOGRAPHIC STUDIES: I have personally reviewed the radiological images as listed and agreed with the findings in the report. No results found.   ASSESSMENT & PLAN:  Sheryl Pratt is a 55 y.o. 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 Enlarged left adrenal gland.   -On 08/09/20, she underwentdiagnostic laparoscopywith rightsalpingo-oophorectomy, omentectomy washings, peritoneal biopsies, and omentectomyto assure the origin of the mass. The large ovarian mass showed adenocarcinoma with signet ring cell features, IHC studies liver upper GI primary. Biopsy of other pelvic tissue was negative for metastasis. -She underwent EGD with Dr. Ardis Hughs on 08/26/20. Final pathology showed poorly differentiated adenofibroma with signet rings features. This is the same as her ovarian pathology.  -Her 08/29/20 PET showed multifocal hypermetabolic skeletal metastasis, Hypermetabolic metastasis in the RIGHT femoral neck with sclerosis may be at risk for pathologic fracture, no other primary or metastatic disease. I reviewed the scan images with pt today  -I discussed with stage IV cancer, her cancer is no longer curable but still treatable to control her disease and prolong her life.  -I recommend systemic treatment with  chemo. I discussed first-line FOLFOX every 2 weeks. I reviewed side effects with her, chemo consent obtained. Plan to start chemo on 09/08/20. Plan to scan her after 3 months treatment.  -She has PD-L1 and [employer] [facility] and other genomic testing pending. Based on results she may be eligible for immunotherapy for added efficacy. -plan to add nivo if PD-L1 positive   -F/u in 2 weeks with C2 chemo.    2. Bulging near abdominal incision/Post Op Pain, Lower back pain secondary to bone mets -We reached out to [facility] who reviewed the imaging. GynOnc believes this is likely normal. They directly reached out to the patient and reassured her this was normal. She will f/u with them.  -The patient will continue to take NSAIDs for pain control. If needed, she has a prescription for tramadol and oxycodone.  -She notes intermittent lower back pain, which is likely secondary to bone mets. Will monitor and offer palliative Radiation if indicated.   3. Family History of Cancer  -The patient's brother had pancreatic cancer, diagnosed at age 2. She notes unknown cancer in another family member.  -She is interested in genetic testing given her son and daughter. I will send referral (08/31/20)  4. Weight loss -The patient lost ~10 lbs since her surgery.  -She previously declined nutritionist referral. I encouraged a high protein high calorie diet to gain and maintain weight.   5. Constipation -The patient has been drinking prune juice and using metamucil -I recommend she drink plenty of fluids, eating fruits and vegetables, and being active helps reduce the risk of constipation. Also advised she may try stool softeners. If she has not had a bowel movement in 2 or more days, then she was encouraged to pick up a laxative.   PLAN: -Labs reviewed and adequate to proceed with C1 FOLFOX on 5/19 -will add Nivo if PD-L1 (+), result is pending  -tumor board discussion, will see if she needs RT to right hip bone  mets  -Lab, flush, F/u and FOLFOX in 2 weeks.   No problem-specific Assessment & Plan notes found for this encounter.   No orders of the defined types were placed in this encounter.  All questions were answered. The patient knows to call the clinic with any problems, questions or concerns. No barriers to learning was detected. The total time spent in the appointment was 30 minutes.  [name], MD 09/05/2020   I, Joslyn Devon, am acting as scribe for [name], MD.   I have reviewed the above documentation for accuracy and completeness, and I agree with the above.

## 2020-09-06 ENCOUNTER — Other Ambulatory Visit: Payer: Self-pay

## 2020-09-06 ENCOUNTER — Telehealth: Payer: Self-pay | Admitting: *Deleted

## 2020-09-06 ENCOUNTER — Encounter: Payer: Self-pay | Admitting: Hematology

## 2020-09-06 ENCOUNTER — Ambulatory Visit (HOSPITAL_COMMUNITY)
Admission: RE | Admit: 2020-09-06 | Discharge: 2020-09-06 | Disposition: A | Discharge status: Home / Self Care | Payer: 59 | Source: Ambulatory Visit | Attending: Hematology | Admitting: Hematology

## 2020-09-06 ENCOUNTER — Encounter (HOSPITAL_COMMUNITY): Payer: Self-pay

## 2020-09-06 DIAGNOSIS — F17210 Nicotine dependence, cigarettes, uncomplicated: Secondary | ICD-10-CM | POA: Diagnosis not present

## 2020-09-06 DIAGNOSIS — Z885 Allergy status to narcotic agent status: Secondary | ICD-10-CM | POA: Insufficient documentation

## 2020-09-06 DIAGNOSIS — C7961 Secondary malignant neoplasm of right ovary: Secondary | ICD-10-CM

## 2020-09-06 DIAGNOSIS — Z79899 Other long term (current) drug therapy: Secondary | ICD-10-CM | POA: Diagnosis not present

## 2020-09-06 DIAGNOSIS — Z888 Allergy status to other drugs, medicaments and biological substances status: Secondary | ICD-10-CM | POA: Diagnosis not present

## 2020-09-06 DIAGNOSIS — C801 Malignant (primary) neoplasm, unspecified: Secondary | ICD-10-CM

## 2020-09-06 DIAGNOSIS — C169 Malignant neoplasm of stomach, unspecified: Secondary | ICD-10-CM | POA: Insufficient documentation

## 2020-09-06 HISTORY — PX: IR IMAGING GUIDED PORT INSERTION: IMG5740

## 2020-09-06 MED ORDER — LIDOCAINE-EPINEPHRINE 1 %-1:100000 IJ SOLN
INTRAMUSCULAR | Status: AC | PRN
  Administered 2020-09-06: 10 mL

## 2020-09-06 MED ORDER — FENTANYL CITRATE (PF) 100 MCG/2ML IJ SOLN
INTRAMUSCULAR | Status: AC | PRN
  Administered 2020-09-06 (×2): 50 ug via INTRAVENOUS

## 2020-09-06 MED ORDER — MIDAZOLAM HCL 2 MG/2ML IJ SOLN
INTRAMUSCULAR | Status: AC
  Filled 2020-09-06: qty 4

## 2020-09-06 MED ORDER — HEPARIN SOD (PORK) LOCK FLUSH 100 UNIT/ML IV SOLN
INTRAVENOUS | Status: AC
  Filled 2020-09-06: qty 5

## 2020-09-06 MED ORDER — LIDOCAINE-EPINEPHRINE 1 %-1:100000 IJ SOLN
INTRAMUSCULAR | Status: AC
  Filled 2020-09-06: qty 1

## 2020-09-06 MED ORDER — MIDAZOLAM HCL 2 MG/2ML IJ SOLN
INTRAMUSCULAR | Status: AC | PRN
  Administered 2020-09-06 (×3): 1 mg via INTRAVENOUS

## 2020-09-06 MED ORDER — FENTANYL CITRATE (PF) 100 MCG/2ML IJ SOLN
INTRAMUSCULAR | Status: AC
  Filled 2020-09-06: qty 2

## 2020-09-06 MED ORDER — HEPARIN SOD (PORK) LOCK FLUSH 100 UNIT/ML IV SOLN
INTRAVENOUS | Status: AC | PRN
  Administered 2020-09-06: 500 [IU] via INTRAVENOUS

## 2020-09-06 MED ORDER — SODIUM CHLORIDE 0.9 % IV SOLN: INTRAVENOUS | Status: DC

## 2020-09-06 NOTE — Discharge Instructions (Signed)
Urgent needs - Interventional Radiology on call MD [phone] ° °Wound - May remove dressing and shower in 24 to 48 hours.  Keep site clean and dry.  Replace with bandaid as needed.  Do not submerge in tub or water until site healing well. If closed with glue, glue will flake off on its own. ° °If ordered by your provider, may start Emla cream in 2 weeks or after incision is healed. ° °After completion of treatment, your provider should have you set up for monthly port flushes.  ° ° °Implanted Port Insertion, Care After °This sheet gives you information about how to care for yourself after your procedure. Your health care provider may also give you more specific instructions. If you have problems or questions, contact your health care provider. °What can I expect after the procedure? °After the procedure, it is common to have: °· Discomfort at the port insertion site. °· Bruising on the skin over the port. This should improve over 3-4 days. °Follow these instructions at home: °Port care °· After your port is placed, you will get a manufacturer's information card. The card has information about your port. Keep this card with you at all times. °· Take care of the port as told by your health care provider. Ask your health care provider if you or a family member can get training for taking care of the port at home. A home health care nurse may also take care of the port. °· Make sure to remember what type of port you have. °Incision care °· Follow instructions from your health care provider about how to take care of your port insertion site. Make sure you: °? Wash your hands with soap and water before and after you change your bandage (dressing). If soap and water are not available, use hand sanitizer. °? Change your dressing as told by your health care provider. °? Leave stitches (sutures), skin glue, or adhesive strips in place. These skin closures may need to stay in place for 2 weeks or longer. If adhesive strip  edges start to loosen and curl up, you may trim the loose edges. Do not remove adhesive strips completely unless your health care provider tells you to do that. °· Check your port insertion site every day for signs of infection. Check for: °? Redness, swelling, or pain. °? Fluid or blood. °? Warmth. °? Pus or a bad smell.  °  °  °Activity °· Return to your normal activities as told by your health care provider. Ask your health care provider what activities are safe for you. °· Do not lift anything that is heavier than 10 lb (4.5 kg), or the limit that you are told, until your health care provider says that it is safe. °General instructions °· Take over-the-counter and prescription medicines only as told by your health care provider. °· Do not take baths, swim, or use a hot tub until your health care provider approves. Ask your health care provider if you may take showers. You may only be allowed to take sponge baths. °· Do not drive for 24 hours if you were given a sedative during your procedure. °· Wear a medical alert bracelet in case of an emergency. This will tell any health care providers that you have a port. °· Keep all follow-up visits as told by your health care provider. This is important. °Contact a health care provider if: °· You cannot flush your port with saline as directed, or you cannot draw blood   from the port. °· You have a fever or chills. °· You have redness, swelling, or pain around your port insertion site. °· You have fluid or blood coming from your port insertion site. °· Your port insertion site feels warm to the touch. °· You have pus or a bad smell coming from the port insertion site. °Get help right away if: °· You have chest pain or shortness of breath. °· You have bleeding from your port that you cannot control. °Summary °· Take care of the port as told by your health care provider. Keep the manufacturer's information card with you at all times. °· Change your dressing as told by your  health care provider. °· Contact a health care provider if you have a fever or chills or if you have redness, swelling, or pain around your port insertion site. °· Keep all follow-up visits as told by your health care provider. °This information is not intended to replace advice given to you by your health care provider. Make sure you discuss any questions you have with your health care provider. °Document Revised: 11/05/2017 Document Reviewed: 11/05/2017 °Elsevier Patient Education © 2021 [employer] ° ° °Moderate Conscious Sedation, Adult, Care After °This sheet gives you information about how to care for yourself after your procedure. Your health care provider may also give you more specific instructions. If you have problems or questions, contact your health care provider. °What can I expect after the procedure? °After the procedure, it is common to have: °· Sleepiness for several hours. °· Impaired judgment for several hours. °· Difficulty with balance. °· Vomiting if you eat too soon. °Follow these instructions at home: °For the time period you were told by your health care provider: °· Rest. °· Do not participate in activities where you could fall or become injured. °· Do not drive or use machinery. °· Do not drink alcohol. °· Do not take sleeping pills or medicines that cause drowsiness. °· Do not make important decisions or sign legal documents. °· Do not take care of children on your own.  °  °  °Eating and drinking °· Follow the diet recommended by your health care provider. °· Drink enough fluid to keep your urine pale yellow. °· If you vomit: °? Drink water, juice, or soup when you can drink without vomiting. °? Make sure you have little or no nausea before eating solid foods.   °General instructions °· Take over-the-counter and prescription medicines only as told by your health care provider. °· Have a responsible adult stay with you for the time you are told. It is important to have someone help  care for you until you are awake and alert. °· Do not smoke. °· Keep all follow-up visits as told by your health care provider. This is important. °Contact a health care provider if: °· You are still sleepy or having trouble with balance after 24 hours. °· You feel light-headed. °· You keep feeling nauseous or you keep vomiting. °· You develop a rash. °· You have a fever. °· You have redness or swelling around the IV site. °Get help right away if: °· You have trouble breathing. °· You have new-onset confusion at home. °Summary °· After the procedure, it is common to feel sleepy, have impaired judgment, or feel nauseous if you eat too soon. °· Rest after you get home. Know the things you should not do after the procedure. °· Follow the diet recommended by your health care provider and drink enough   fluid to keep your urine pale yellow. °· Get help right away if you have trouble breathing or new-onset confusion at home. °This information is not intended to replace advice given to you by your health care provider. Make sure you discuss any questions you have with your health care provider. °Document Revised: 08/07/2019 Document Reviewed: 03/05/2019 °Elsevier Patient Education © 2021 [employer] Inc. ° ° °

## 2020-09-06 NOTE — Procedures (Signed)
  Procedure: R IJ Port catheter placement   EBL:   minimal Complications:  none immediate  See full dictation in [facility] P[facility].  D. Clyda Smyth MD Main # [phone] Pager  [phone] Mobile [phone]     

## 2020-09-06 NOTE — Telephone Encounter (Signed)
  Follow up Call-  Call back number 09/02/2020 08/26/2020  Post procedure Call Back phone  # [phone]  Permission to leave phone message Yes Yes  Some recent data might be hidden     Patient questions:  Do you have a fever, pain , or abdominal swelling? No. Pain Score  0 *  Have you tolerated food without any problems? Yes.    Have you been able to return to your normal activities? Yes.    Do you have any questions about your discharge instructions: Diet   No. Medications  No. Follow up visit  No.  Do you have questions or concerns about your Care? No.  Actions: * If pain score is 4 or above: No action needed, pain <4.  1. Have you developed a fever since your procedure? no  2.   Have you had an respiratory symptoms (SOB or cough) since your procedure? no  3.   Have you tested positive for COVID 19 since your procedure no  4.   Have you had any family members/close contacts diagnosed with the COVID 19 since your procedure?  no   If yes to any of these questions please route to Joylene John, RN and Joella Prince, RN

## 2020-09-06 NOTE — Consult Note (Signed)
Chief Complaint: Patient was seen in consultation today for Port-A-Cath placement  Referring Physician(s): Feng,Yan  Supervising Physician: Oley Balm  Patient Status: [facility] - Out-pt  History of Present Illness: Sheryl Pratt is a 55 y.o. female smoker with history of newly diagnosed metastatic gastric adenocarcinoma, status post exploratory lap with right salpingo-oophorectomy, omentectomy, peritoneal biopsies on 08/09/2020 and EGD on 08/26/2020. She presents today for Port-A-Cath placement for planned chemotherapy.  Past Medical History:  Diagnosis Date  . Allergy   . Arthritis   . Bloating   . Breast mass, right   . Cancer (HCC)   . Constipation   . Migraine   . Pelvic mass   . PONV (postoperative nausea and vomiting)   . Seasonal allergies   . Vitamin D deficiency     Past Surgical History:  Procedure Laterality Date  . APPENDECTOMY    . AUGMENTATION MAMMAPLASTY Bilateral   . BREAST BIOPSY Right 2014   fibroadenoma  . BREAST SURGERY  18 years ago   augmentation.  Marland Kitchen LAPAROTOMY  08/09/2020   Procedure: EXPLORATORY LAPAROTOMY, MASS EXCISION, PERITONEAL BIOPSIES;  Surgeon: Carver Fila, MD;  Location: [facility] ORS;  Service: Gynecology;;  . OMENTECTOMY N/A 08/09/2020   Procedure: OMENTECTOMY;  Surgeon: Carver Fila, MD;  Location: [facility];  Service: Gynecology;  Laterality: N/A;  . SALPINGOOPHORECTOMY Right 08/09/2020   Procedure: OPEN RIGHT SALPINGO OOPHORECTOMY;  Surgeon: Carver Fila, MD;  Location: [facility];  Service: Gynecology;  Laterality: Right;  Marland Kitchen VAGINAL HYSTERECTOMY  2015    Allergies: Codeine and Wellbutrin [bupropion]  Medications: Prior to Admission medications   Medication Sig Start Date End Date Taking? Authorizing Provider  cholecalciferol (VITAMIN D3) 25 MCG (1000 UNIT) tablet Take 1,000 Units by mouth daily.    [provider]  diphenhydrAMINE (BENADRYL) 25 mg capsule Take 25 mg by mouth at bedtime as needed for  allergies.    [provider]  enoxaparin (LOVENOX) 40 MG/0.4ML injection Inject 0.4 mLs (40 mg total) into the skin daily for 27 days. 08/11/20 09/07/20  Warner Mccreedy D, NP  ibuprofen (ADVIL) 800 MG tablet Take 1 tablet (800 mg total) by mouth every 8 (eight) hours as needed for moderate pain. For AFTER surgery only 08/02/20   Warner Mccreedy D, NP  Ibuprofen-diphenhydrAMINE HCl (IBUPROFEN PM) 200-25 MG CAPS Take 1 tablet by mouth at bedtime as needed (sleep).    [provider]  ipratropium (ATROVENT) 0.03 % nasal spray Place 2 sprays into both nostrils every 12 (twelve) hours. Patient not taking: No sig reported 06/27/20   Mayer Masker, PA-C  lidocaine-prilocaine (EMLA) cream Apply to affected area once Patient not taking: Reported on 09/02/2020 08/31/20   [name], MD  Multiple Vitamin (MULTIVITAMIN) tablet Take 1 tablet by mouth daily.    [provider]  ondansetron (ZOFRAN) 8 MG tablet Take 1 tablet (8 mg total) by mouth 2 (two) times daily as needed for refractory nausea / vomiting. Start on day 3 after chemotherapy. Patient not taking: Reported on 09/02/2020 08/31/20   [name], MD  oxyCODONE (OXY IR/ROXICODONE) 5 MG immediate release tablet Take 1 tablet (5 mg total) by mouth every 4 (four) hours as needed for severe pain. For severe pain only, do not take and drive 0/27/74   Cross, Efraim Kaufmann D, NP  prochlorperazine (COMPAZINE) 10 MG tablet Take 1 tablet (10 mg total) by mouth every 6 (six) hours as needed (Nausea or vomiting). Patient not taking: Reported on 09/02/2020 08/31/20  [name], MD  Psyllium (METAMUCIL PO) Take 1 Dose by mouth daily. Patient not taking: No sig reported    [provider]  senna-docusate (SENOKOT-S) 8.6-50 MG tablet Take 2 tablets by mouth at bedtime. Do not take if having diarrhea Patient taking differently: Take 2 tablets by mouth daily. Do not take if having diarrhea 08/02/20   Joylene John D, NP  Tetrahydrozoline HCl (VISINE  OP) Place 1 drop into both eyes daily as needed (itching Daralene Milch).    [provider]  traMADol (ULTRAM) 50 MG tablet Take 1 tablet (50 mg total) by mouth every 6 (six) hours as needed for severe pain. For AFTER surgery, do not take and drive 1/61/09   Joylene John D, NP  vitamin B-12 (CYANOCOBALAMIN) 1000 MCG tablet Take 1,000 mcg by mouth daily.    [provider]  vitamin C (ASCORBIC ACID) 500 MG tablet Take 500 mg by mouth daily.    [provider]  Vitamin D, Ergocalciferol, (DRISDOL) 1.25 MG (50000 UT) CAPS capsule Take one tablet wkly 04/14/19   Opalski, Deborah, DO  vitamin E 180 MG (400 UNITS) capsule Take 400 Units by mouth daily.    [provider]     Family History  Problem Relation Age of Onset  . Cancer Brother        pancreatic  . Ovarian cancer Neg Hx   . Uterine cancer Neg Hx   . Breast cancer Neg Hx   . Colon cancer Neg Hx     Social History   Socioeconomic History  . Marital status: Married    Spouse name: Trayce Caravello  . Number of children: Not on file  . Years of education: Not on file  . Highest education level: Not on file  Occupational History  . Not on file  Tobacco Use  . Smoking status: Current Every Day Smoker    Packs/day: 0.25    Years: 10.00    Pack years: 2.50    Types: Cigarettes  . Smokeless tobacco: Never Used  . Tobacco comment: 1-2 cigs QD  Vaping Use  . Vaping Use: Never used  Substance and Sexual Activity  . Alcohol use: Yes    Comment: occasional  . Drug use: No  . Sexual activity: Yes    Birth control/protection: Surgical  Other Topics Concern  . Not on file  Social History Narrative  . Not on file   Social Determinants of Health   Financial Resource Strain: Not on file  Food Insecurity: Not on file  Transportation Needs: Not on file  Physical Activity: Not on file  Stress: Not on file  Social Connections: Not on file      Review of Systems currently denies fever, chest pain,  dyspnea, cough, back pain, nausea, vomiting or bleeding.  She does have occasional headaches and some mild abdominal discomfort.  Vital Signs:pending LMP 07/02/2012   Physical Exam awake, alert.  Chest clear to auscultation bilaterally.  Heart with regular rate and rhythm.  Abdomen soft, positive bowel sounds, clean midline vertical abdominal scar, slightly tender to palpation.  No lower extremity edema.  Imaging: NM PET Image Initial (PI) Skull Base To Thigh  Result Date: 08/30/2020 CLINICAL DATA:  Initial treatment strategy for metastatic adenocarcinoma of unknown primary. Pelvic mass a with salpingectomy of RIGHT ovary. Krukenberg tumor of the RIGHT ovary. EXAM: NUCLEAR MEDICINE PET SKULL BASE TO THIGH TECHNIQUE: 6.4 mCi F-18 FDG was injected intravenously. Full-ring PET imaging was performed from the skull  base to thigh after the radiotracer. CT data was obtained and used for attenuation correction and anatomic localization. Fasting blood glucose: 84 mg/dl COMPARISON:  None. FINDINGS: Mediastinal blood pool activity: SUV max 1.8 Liver activity: SUV max eight NECK: No hypermetabolic lymph nodes in the neck. Incidental CT findings: none CHEST: No hypermetabolic mediastinal or hilar nodes. No suspicious pulmonary nodules on the CT scan. Incidental CT findings: none ABDOMEN/PELVIS: Interval resection of the large pelvic mass. No hypermetabolic activity within the pelvis. No hypermetabolic pelvic or retroperitoneal lymph nodes. There is mild diffuse metabolic activity through the gastric fundus and body with SUV max equal 4.2 (image 106.) mild activity through the duodenum also with SUV max equal 4.2. No discrete lesion associated with this diffuse activity in the proximal GI tract. The LEFT adrenal gland has low attenuation consistent with9 adenoma. No metabolic activity. Incidental CT findings: none SKELETON: There is multifocal hypermetabolic skeletal lesions involving the pelvis and spine as well as  shoulder girdles and ribs. Example lesion in the LEFT sacral ala with SUV max equal 5.2. Lesion essentially occult by CT with only hazy sclerosis within the medullary space (image 145). Example lesion in the L5 vertebral body with SUV max equal 3.9 example lesion at L1 with SUV max equal 4.7. There approximately 10 lesions in the spine. There is a lesion within the RIGHT femoral neck with SUV max equal 4.4. There is sclerosis associated this lesion on image 171. Incidental CT findings: none IMPRESSION: 1. Dominant finding is multifocal hypermetabolic skeletal metastasis. Lesions are subtly evident on CT with subtle sclerosis. Lesions include the pelvis, spine and ribs. 2. Hypermetabolic metastasis in the RIGHT femoral neck with sclerosis may be at risk for pathologic fracture. 3. Resection of large pelvic mass. No residual carcinoma evident within the pelvis. 4. Benign LEFT adrenal adenoma. 5. Diffuse mild metabolic activity in the gastric fundus and proximal body is nonspecific. Recommend correlation with recent upper endoscopy. Electronically Signed   By: Suzy Bouchard M.D.   On: 08/30/2020 11:54    Labs:  CBC: Recent Labs    07/30/20 1228 08/09/20 1200 08/10/20 0419 09/05/20 1549  WBC 7.2 6.3 8.7 7.3  HGB 12.2 11.7* 9.6* 11.5*  HCT 37.2 35.8* 29.0* 34.2*  PLT 260 256 222 248    COAGS: No results for input(s): INR, APTT in the last 8760 hours.  BMP: Recent Labs    07/30/20 1228 08/09/20 1200 08/10/20 0419 09/05/20 1549  NA 137 138 135 138  K 3.8 4.0 4.3 4.1  CL 108 107 107 102  CO2 22 21* 23 27  GLUCOSE 96 85 145* 142*  BUN 11 10 10 17   CALCIUM 8.8* 8.6* 7.8* 9.2  CREATININE 0.48 0.55 0.56 0.66  GFRNONAA >60 >60 >60 >60    LIVER FUNCTION TESTS: Recent Labs    07/30/20 1228 09/05/20 1549  BILITOT 0.2* <0.2*  AST 15 14*  ALT 9 15  ALKPHOS 60 220*  PROT 6.6 7.5  ALBUMIN 3.6 3.8    TUMOR MARKERS: No results for input(s): AFPTM, CEA, CA199, CHROMGRNA in the last  8760 hours.  Assessment and Plan: 55 y.o. female with history of newly diagnosed metastatic gastric adenocarcinoma, status post exploratory lap with right salpingo-oophorectomy, omentectomy, peritoneal biopsies on 08/09/2020 and EGD on 08/26/2020. She presents today for Port-A-Cath placement for planned chemotherapy.Risks and benefits of image guided port-a-catheter placement was discussed with the patient including, but not limited to bleeding, infection, pneumothorax, or fibrin sheath development and need for additional  procedures.  All of the patient's questions were answered, patient is agreeable to proceed. Consent signed and in chart.     Thank you for this interesting consult.  I greatly enjoyed meeting Sheryl Pratt and look forward to participating in their care.  A copy of this report was sent to the requesting provider on this date.  Electronically Signed: D. Rowe Robert, PA-C 09/06/2020, 1:18 PM   I spent a total of 25 minutes  in face to face in clinical consultation, greater than 50% of which was counseling/coordinating care for Port-A-Cath placement

## 2020-09-07 ENCOUNTER — Encounter: Payer: Self-pay | Admitting: *Deleted

## 2020-09-07 ENCOUNTER — Telehealth: Payer: Self-pay

## 2020-09-07 ENCOUNTER — Telehealth: Payer: Self-pay | Admitting: Genetic Counselor

## 2020-09-07 NOTE — Telephone Encounter (Signed)
Sheryl Pratt left vm stating she is concerned that she is not getting radiation to her right leg as Dr Earnestine Mealing had discussed with her as a possibility.

## 2020-09-07 NOTE — Progress Notes (Signed)
[place] Work  Clinical Social Work received referral from medical oncology for disability questions and concerns.  CSW contacted patient at home to offer support and assess for needs.  Patient has been recently diagnosed with metastatic gastric cancer, and would like to explore disability options.  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  CSW and patient discussed Social Security Disability and the general requirements.  CSW provided education on [facility], and patient was agreeable to a referral.  CSW submitted referral and patient will be contacted by [facility].  CSW provided education on CSW role and support team at [facility].  CSW and patient discussed common feelings and emotions when being diagnosed with cancer and the importance of support.  CSW prepared a packet with support programs and resources for patient to pick up at her next appointment.  Pateitn was appreciative of CSW contact and did not express any additional concerns at this time.  CSW provided contact information and encouraged patient to cal with questions or concerns.    Johnnye Lana, MSW, LCSW, OSW-C Clinical Social Worker [facility] [phone]

## 2020-09-07 NOTE — Telephone Encounter (Signed)
Changed genetics appt to 2pm on 5/19.  Declined virtual genetics appointment.

## 2020-09-08 ENCOUNTER — Inpatient Hospital Stay: Payer: 59

## 2020-09-08 ENCOUNTER — Inpatient Hospital Stay (HOSPITAL_BASED_OUTPATIENT_CLINIC_OR_DEPARTMENT_OTHER): Payer: 59 | Admitting: Genetic Counselor

## 2020-09-08 ENCOUNTER — Encounter: Payer: 59 | Admitting: Genetic Counselor

## 2020-09-08 ENCOUNTER — Other Ambulatory Visit: Payer: Self-pay | Admitting: Genetic Counselor

## 2020-09-08 ENCOUNTER — Other Ambulatory Visit: Payer: Self-pay

## 2020-09-08 ENCOUNTER — Other Ambulatory Visit: Payer: 59

## 2020-09-08 ENCOUNTER — Encounter: Payer: Self-pay | Admitting: Genetic Counselor

## 2020-09-08 VITALS — BP 92/70 | HR 70 | Temp 98.0°F | Resp 16

## 2020-09-08 DIAGNOSIS — C169 Malignant neoplasm of stomach, unspecified: Secondary | ICD-10-CM | POA: Diagnosis not present

## 2020-09-08 DIAGNOSIS — Z800 Family history of malignant neoplasm of digestive organs: Secondary | ICD-10-CM | POA: Insufficient documentation

## 2020-09-08 DIAGNOSIS — Z808 Family history of malignant neoplasm of other organs or systems: Secondary | ICD-10-CM | POA: Diagnosis not present

## 2020-09-08 DIAGNOSIS — Z8041 Family history of malignant neoplasm of ovary: Secondary | ICD-10-CM

## 2020-09-08 DIAGNOSIS — C801 Malignant (primary) neoplasm, unspecified: Secondary | ICD-10-CM

## 2020-09-08 DIAGNOSIS — Z5111 Encounter for antineoplastic chemotherapy: Secondary | ICD-10-CM | POA: Diagnosis not present

## 2020-09-08 DIAGNOSIS — C7961 Secondary malignant neoplasm of right ovary: Secondary | ICD-10-CM

## 2020-09-08 HISTORY — DX: Family history of malignant neoplasm of digestive organs: Z80.0

## 2020-09-08 HISTORY — DX: Family history of malignant neoplasm of other organs or systems: Z80.8

## 2020-09-08 HISTORY — DX: Family history of malignant neoplasm of ovary: Z80.41

## 2020-09-08 LAB — CBC WITH DIFFERENTIAL (CANCER CENTER ONLY)
Abs Immature Granulocytes: 0.03 10*3/uL (ref 0.00–0.07)
Basophils Absolute: 0 10*3/uL (ref 0.0–0.1)
Basophils Relative: 0 %
Eosinophils Absolute: 0 10*3/uL (ref 0.0–0.5)
Eosinophils Relative: 0 %
HCT: 34.8 % — ABNORMAL LOW (ref 36.0–46.0)
Hemoglobin: 11.6 g/dL — ABNORMAL LOW (ref 12.0–15.0)
Immature Granulocytes: 0 %
Lymphocytes Relative: 10 %
Lymphs Abs: 0.9 10*3/uL (ref 0.7–4.0)
MCH: 31.4 pg (ref 26.0–34.0)
MCHC: 33.3 g/dL (ref 30.0–36.0)
MCV: 94.3 fL (ref 80.0–100.0)
Monocytes Absolute: 0.1 10*3/uL (ref 0.1–1.0)
Monocytes Relative: 1 %
Neutro Abs: 8.5 10*3/uL — ABNORMAL HIGH (ref 1.7–7.7)
Neutrophils Relative %: 89 %
Platelet Count: 244 10*3/uL (ref 150–400)
RBC: 3.69 MIL/uL — ABNORMAL LOW (ref 3.87–5.11)
RDW: 12.8 % (ref 11.5–15.5)
WBC Count: 9.5 10*3/uL (ref 4.0–10.5)
nRBC: 0 % (ref 0.0–0.2)

## 2020-09-08 LAB — CMP (CANCER CENTER ONLY)
ALT: 14 U/L (ref 0–44)
AST: 15 U/L (ref 15–41)
Albumin: 3.8 g/dL (ref 3.5–5.0)
Alkaline Phosphatase: 245 U/L — ABNORMAL HIGH (ref 38–126)
Anion gap: 11 (ref 5–15)
BUN: 17 mg/dL (ref 6–20)
CO2: 25 mmol/L (ref 22–32)
Calcium: 9.3 mg/dL (ref 8.9–10.3)
Chloride: 104 mmol/L (ref 98–111)
Creatinine: 0.72 mg/dL (ref 0.44–1.00)
GFR, Estimated: >60 mL/min (ref >60)
Glucose, Bld: 204 mg/dL — ABNORMAL HIGH (ref 70–99)
Potassium: 4.1 mmol/L (ref 3.5–5.1)
Sodium: 140 mmol/L (ref 135–145)
Total Bilirubin: 0.2 mg/dL — ABNORMAL LOW (ref 0.3–1.2)
Total Protein: 7.3 g/dL (ref 6.5–8.1)

## 2020-09-08 LAB — GENETIC SCREENING ORDER

## 2020-09-08 MED ORDER — PALONOSETRON HCL INJECTION 0.25 MG/5ML
0.2500 mg | Freq: Once | INTRAVENOUS | Status: AC
  Administered 2020-09-08: 0.25 mg via INTRAVENOUS
  Filled 2020-09-08: qty 5

## 2020-09-08 MED ORDER — DEXTROSE 5 % IV SOLN
Freq: Once | INTRAVENOUS | Status: AC
  Filled 2020-09-08: qty 250

## 2020-09-08 MED ORDER — SODIUM CHLORIDE 0.9 % IV SOLN
10.0000 mg | Freq: Once | INTRAVENOUS | Status: AC
  Administered 2020-09-08: 10 mg via INTRAVENOUS
  Filled 2020-09-08 (×2): qty 1

## 2020-09-08 MED ORDER — LEUCOVORIN CALCIUM INJECTION 350 MG
400.0000 mg/m2 | Freq: Once | INTRAVENOUS | Status: AC
  Administered 2020-09-08: 636 mg via INTRAVENOUS
  Filled 2020-09-08: qty 31.8

## 2020-09-08 MED ORDER — SODIUM CHLORIDE 0.9 % IV SOLN
2400.0000 mg/m2 | INTRAVENOUS | Status: DC
  Administered 2020-09-08: 3800 mg via INTRAVENOUS
  Filled 2020-09-08: qty 76

## 2020-09-08 MED ORDER — SODIUM CHLORIDE 0.9% FLUSH
10.0000 mL | INTRAVENOUS | Status: DC | PRN
  Administered 2020-09-08: 10 mL
  Filled 2020-09-08: qty 10

## 2020-09-08 MED ORDER — DEXTROSE 5 % IV SOLN
85.0000 mg/m2 | Freq: Once | INTRAVENOUS | Status: AC
  Administered 2020-09-08: 135 mg via INTRAVENOUS
  Filled 2020-09-08: qty 27

## 2020-09-08 NOTE — Patient Instructions (Addendum)
[place] [facility]   Discharge Instructions: Thank you for choosing [facility] to provide your oncology and hematology care.   If you have a lab appointment with the [facility], please go directly to the [facility] and check in at the registration area.   Wear comfortable clothing and clothing appropriate for easy access to any Portacath or PICC line.   We strive to give you quality time with your provider. You may need to reschedule your appointment if you arrive late (15 or more minutes).  Arriving late affects you and other patients whose appointments are after yours.  Also, if you miss three or more appointments without notifying the office, you may be dismissed from the clinic at the provider's discretion.      For prescription refill requests, have your pharmacy contact our office and allow 72 hours for refills to be completed.    Today you received the following chemotherapy and/or immunotherapy agents: oxaliplatin, leucovorin, fluorouracil.     To help prevent nausea and vomiting after your treatment, we encourage you to take your nausea medication as directed. You received aloxi today as a premedication.  Please wait to take ondansetron (Zofran) for 3 days. You can take prochlorperazine (Compazine) instead.  You can resume taking ondansetron for nausea on Sunday (09/11/20) afternoon.   BELOW ARE SYMPTOMS THAT SHOULD BE REPORTED IMMEDIATELY: . *FEVER GREATER THAN 100.4 F (38 C) OR HIGHER . *CHILLS OR SWEATING . *NAUSEA AND VOMITING THAT IS NOT CONTROLLED WITH YOUR NAUSEA MEDICATION . *UNUSUAL SHORTNESS OF BREATH . *UNUSUAL BRUISING OR BLEEDING . *URINARY PROBLEMS (pain or burning when urinating, or frequent urination) . *BOWEL PROBLEMS (unusual diarrhea, constipation, pain near the anus) . TENDERNESS IN MOUTH AND THROAT WITH OR WITHOUT PRESENCE OF ULCERS (sore throat, sores in mouth, or a toothache) . UNUSUAL RASH, SWELLING OR PAIN   . UNUSUAL VAGINAL DISCHARGE OR ITCHING   Items with * indicate a potential emergency and should be followed up as soon as possible or go to the Emergency Department if any problems should occur.  Please show the CHEMOTHERAPY ALERT CARD or IMMUNOTHERAPY ALERT CARD at check-in to the Emergency Department and triage nurse.  Should you have questions after your visit or need to cancel or reschedule your appointment, please contact [facility]  Dept: [phone]  and follow the prompts.  Office hours are 8:00 a.m. to 4:30 p.m. Monday - Friday. Please note that voicemails left after 4:00 p.m. may not be returned until the following business day.  We are closed weekends and major holidays. You have access to a nurse at all times for urgent questions. Please call the main number to the clinic Dept: [phone] and follow the prompts.   For any non-urgent questions, you may also contact your provider using MyChart. We now offer e-Visits for anyone 42 and older to request care online for non-urgent symptoms. For details visit [email or web address].   Also download the MyChart app! Go to the app store, search "MyChart", open the app, select [facility], and log in with your MyChart username and password.  Due to Covid, a mask is required upon entering the hospital/clinic. If you do not have a mask, one will be given to you upon arrival. For doctor visits, patients may have 1 support person aged 3 or older with them. For treatment visits, patients cannot have anyone with them due to current Covid guidelines and our immunocompromised population.   Oxaliplatin  Injection What is this medicine? OXALIPLATIN (ox AL i PLA tin) is a chemotherapy drug. It targets fast dividing cells, like cancer cells, and causes these cells to die. This medicine is used to treat cancers of the colon and rectum, and many other cancers. This medicine may be used for other purposes; ask your health care  provider or pharmacist if you have questions. COMMON BRAND NAME(S): Eloxatin What should I tell my health care provider before I take this medicine? They need to know if you have any of these conditions:  heart disease  history of irregular heartbeat  liver disease  low blood counts, like white cells, platelets, or red blood cells  lung or breathing disease, like asthma  take medicines that treat or prevent blood clots  tingling of the fingers or toes, or other nerve disorder  an unusual or allergic reaction to oxaliplatin, other chemotherapy, other medicines, foods, dyes, or preservatives  pregnant or trying to get pregnant  breast-feeding How should I use this medicine? This drug is given as an infusion into a vein. It is administered in a hospital or clinic by a specially trained health care professional. Talk to your pediatrician regarding the use of this medicine in children. Special care may be needed. Overdosage: If you think you have taken too much of this medicine contact a poison control center or emergency room at once. NOTE: This medicine is only for you. Do not share this medicine with others. What if I miss a dose? It is important not to miss a dose. Call your doctor or health care professional if you are unable to keep an appointment. What may interact with this medicine? Do not take this medicine with any of the following medications:  cisapride  dronedarone  pimozide  thioridazine This medicine may also interact with the following medications:  aspirin and aspirin-like medicines  certain medicines that treat or prevent blood clots like warfarin, apixaban, dabigatran, and rivaroxaban  cisplatin  cyclosporine  diuretics  medicines for infection like acyclovir, adefovir, amphotericin B, bacitracin, cidofovir, foscarnet, ganciclovir, gentamicin, pentamidine, vancomycin  NSAIDs, medicines for pain and inflammation, like ibuprofen or naproxen  other  medicines that prolong the QT interval (an abnormal heart rhythm)  pamidronate  zoledronic acid This list may not describe all possible interactions. Give your health care provider a list of all the medicines, herbs, non-prescription drugs, or dietary supplements you use. Also tell them if you smoke, drink alcohol, or use illegal drugs. Some items may interact with your medicine. What should I watch for while using this medicine? Your condition will be monitored carefully while you are receiving this medicine. You may need blood work done while you are taking this medicine. This medicine may make you feel generally unwell. This is not uncommon as chemotherapy can affect healthy cells as well as cancer cells. Report any side effects. Continue your course of treatment even though you feel ill unless your healthcare professional tells you to stop. This medicine can make you more sensitive to cold. Do not drink cold drinks or use ice. Cover exposed skin before coming in contact with cold temperatures or cold objects. When out in cold weather wear warm clothing and cover your mouth and nose to warm the air that goes into your lungs. Tell your doctor if you get sensitive to the cold. Do not become pregnant while taking this medicine or for 9 months after stopping it. Women should inform their health care professional if they wish to become  pregnant or think they might be pregnant. Men should not father a child while taking this medicine and for 6 months after stopping it. There is potential for serious side effects to an unborn child. Talk to your health care professional for more information. Do not breast-feed a child while taking this medicine or for 3 months after stopping it. This medicine has caused ovarian failure in some women. This medicine may make it more difficult to get pregnant. Talk to your health care professional if you are concerned about your fertility. This medicine has caused decreased  sperm counts in some men. This may make it more difficult to father a child. Talk to your health care professional if you are concerned about your fertility. This medicine may increase your risk of getting an infection. Call your health care professional for advice if you get a fever, chills, or sore throat, or other symptoms of a cold or flu. Do not treat yourself. Try to avoid being around people who are sick. Avoid taking medicines that contain aspirin, acetaminophen, ibuprofen, naproxen, or ketoprofen unless instructed by your health care professional. These medicines may hide a fever. Be careful brushing or flossing your teeth or using a toothpick because you may get an infection or bleed more easily. If you have any dental work done, tell your dentist you are receiving this medicine. What side effects may I notice from receiving this medicine? Side effects that you should report to your doctor or health care professional as soon as possible:  allergic reactions like skin rash, itching or hives, swelling of the face, lips, or tongue  breathing problems  cough  low blood counts - this medicine may decrease the number of white blood cells, red blood cells, and platelets. You may be at increased risk for infections and bleeding  nausea, vomiting  pain, redness, or irritation at site where injected  pain, tingling, numbness in the hands or feet  signs and symptoms of bleeding such as bloody or black, tarry stools; red or dark Gonyea urine; spitting up blood or Nelles material that looks like coffee grounds; red spots on the skin; unusual bruising or bleeding from the eyes, gums, or nose  signs and symptoms of a dangerous change in heartbeat or heart rhythm like chest pain; dizziness; fast, irregular heartbeat; palpitations; feeling faint or lightheaded; falls  signs and symptoms of infection like fever; chills; cough; sore throat; pain or trouble passing urine  signs and symptoms of liver  injury like dark yellow or Beel urine; general ill feeling or flu-like symptoms; light-colored stools; loss of appetite; nausea; right upper belly pain; unusually weak or tired; yellowing of the eyes or skin  signs and symptoms of low red blood cells or anemia such as unusually weak or tired; feeling faint or lightheaded; falls  signs and symptoms of muscle injury like dark urine; trouble passing urine or change in the amount of urine; unusually weak or tired; muscle pain; back pain Side effects that usually do not require medical attention (report to your doctor or health care professional if they continue or are bothersome):  changes in taste  diarrhea  gas  hair loss  loss of appetite  mouth sores This list may not describe all possible side effects. Call your doctor for medical advice about side effects. You may report side effects to FDA at 1-800-FDA-1088. Where should I keep my medicine? This drug is given in a hospital or clinic and will not be stored at home. NOTE:  This sheet is a summary. It may not cover all possible information. If you have questions about this medicine, talk to your doctor, pharmacist, or health care provider.  2021 Elsevier/Gold Standard (2018-08-27 12:20:35)  Leucovorin injection What is this medicine? LEUCOVORIN (loo koe VOR in) is used to prevent or treat the harmful effects of some medicines. This medicine is used to treat anemia caused by a low amount of folic acid in the body. It is also used with 5-fluorouracil (5-FU) to treat colon cancer. This medicine may be used for other purposes; ask your health care provider or pharmacist if you have questions. What should I tell my health care provider before I take this medicine? They need to know if you have any of these conditions:  anemia from low levels of vitamin B-12 in the blood  an unusual or allergic reaction to leucovorin, folic acid, other medicines, foods, dyes, or preservatives  pregnant  or trying to get pregnant  breast-feeding How should I use this medicine? This medicine is for injection into a muscle or into a vein. It is given by a health care professional in a hospital or clinic setting. Talk to your pediatrician regarding the use of this medicine in children. Special care may be needed. Overdosage: If you think you have taken too much of this medicine contact a poison control center or emergency room at once. NOTE: This medicine is only for you. Do not share this medicine with others. What if I miss a dose? This does not apply. What may interact with this medicine?  capecitabine  fluorouracil  phenobarbital  phenytoin  primidone  trimethoprim-sulfamethoxazole This list may not describe all possible interactions. Give your health care provider a list of all the medicines, herbs, non-prescription drugs, or dietary supplements you use. Also tell them if you smoke, drink alcohol, or use illegal drugs. Some items may interact with your medicine. What should I watch for while using this medicine? Your condition will be monitored carefully while you are receiving this medicine. This medicine may increase the side effects of 5-fluorouracil, 5-FU. Tell your doctor or health care professional if you have diarrhea or mouth sores that do not get better or that get worse. What side effects may I notice from receiving this medicine? Side effects that you should report to your doctor or health care professional as soon as possible:  allergic reactions like skin rash, itching or hives, swelling of the face, lips, or tongue  breathing problems  fever, infection  mouth sores  unusual bleeding or bruising  unusually weak or tired Side effects that usually do not require medical attention (report to your doctor or health care professional if they continue or are bothersome):  constipation or diarrhea  loss of appetite  nausea, vomiting This list may not describe  all possible side effects. Call your doctor for medical advice about side effects. You may report side effects to FDA at 1-800-FDA-1088. Where should I keep my medicine? This drug is given in a hospital or clinic and will not be stored at home. NOTE: This sheet is a summary. It may not cover all possible information. If you have questions about this medicine, talk to your doctor, pharmacist, or health care provider.  2021 Elsevier/Gold Standard (2007-10-14 16:50:29)  Fluorouracil, 5-FU injection What is this medicine? FLUOROURACIL, 5-FU (flure oh YOOR a sil) is a chemotherapy drug. It slows the growth of cancer cells. This medicine is used to treat many types of cancer like breast cancer,  colon or rectal cancer, pancreatic cancer, and stomach cancer. This medicine may be used for other purposes; ask your health care provider or pharmacist if you have questions. COMMON BRAND NAME(S): Adrucil What should I tell my health care provider before I take this medicine? They need to know if you have any of these conditions:  blood disorders  dihydropyrimidine dehydrogenase (DPD) deficiency  infection (especially a virus infection such as chickenpox, cold sores, or herpes)  kidney disease  liver disease  malnourished, poor nutrition  recent or ongoing radiation therapy  an unusual or allergic reaction to fluorouracil, other chemotherapy, other medicines, foods, dyes, or preservatives  pregnant or trying to get pregnant  breast-feeding How should I use this medicine? This drug is given as an infusion or injection into a vein. It is administered in a hospital or clinic by a specially trained health care professional. Talk to your pediatrician regarding the use of this medicine in children. Special care may be needed. Overdosage: If you think you have taken too much of this medicine contact a poison control center or emergency room at once. NOTE: This medicine is only for you. Do not share  this medicine with others. What if I miss a dose? It is important not to miss your dose. Call your doctor or health care professional if you are unable to keep an appointment. What may interact with this medicine? Do not take this medicine with any of the following medications:  live virus vaccines This medicine may also interact with the following medications:  medicines that treat or prevent blood clots like warfarin, enoxaparin, and dalteparin This list may not describe all possible interactions. Give your health care provider a list of all the medicines, herbs, non-prescription drugs, or dietary supplements you use. Also tell them if you smoke, drink alcohol, or use illegal drugs. Some items may interact with your medicine. What should I watch for while using this medicine? Visit your doctor for checks on your progress. This drug may make you feel generally unwell. This is not uncommon, as chemotherapy can affect healthy cells as well as cancer cells. Report any side effects. Continue your course of treatment even though you feel ill unless your doctor tells you to stop. In some cases, you may be given additional medicines to help with side effects. Follow all directions for their use. Call your doctor or health care professional for advice if you get a fever, chills or sore throat, or other symptoms of a cold or flu. Do not treat yourself. This drug decreases your body's ability to fight infections. Try to avoid being around people who are sick. This medicine may increase your risk to bruise or bleed. Call your doctor or health care professional if you notice any unusual bleeding. Be careful brushing and flossing your teeth or using a toothpick because you may get an infection or bleed more easily. If you have any dental work done, tell your dentist you are receiving this medicine. Avoid taking products that contain aspirin, acetaminophen, ibuprofen, naproxen, or ketoprofen unless instructed by  your doctor. These medicines may hide a fever. Do not become pregnant while taking this medicine. Women should inform their doctor if they wish to become pregnant or think they might be pregnant. There is a potential for serious side effects to an unborn child. Talk to your health care professional or pharmacist for more information. Do not breast-feed an infant while taking this medicine. Men should inform their doctor if they wish to  father a child. This medicine may lower sperm counts. Do not treat diarrhea with over the counter products. Contact your doctor if you have diarrhea that lasts more than 2 days or if it is severe and watery. This medicine can make you more sensitive to the sun. Keep out of the sun. If you cannot avoid being in the sun, wear protective clothing and use sunscreen. Do not use sun lamps or tanning beds/booths. What side effects may I notice from receiving this medicine? Side effects that you should report to your doctor or health care professional as soon as possible:  allergic reactions like skin rash, itching or hives, swelling of the face, lips, or tongue  low blood counts - this medicine may decrease the number of white blood cells, red blood cells and platelets. You may be at increased risk for infections and bleeding.  signs of infection - fever or chills, cough, sore throat, pain or difficulty passing urine  signs of decreased platelets or bleeding - bruising, pinpoint red spots on the skin, black, tarry stools, blood in the urine  signs of decreased red blood cells - unusually weak or tired, fainting spells, lightheadedness  breathing problems  changes in vision  chest pain  mouth sores  nausea and vomiting  pain, swelling, redness at site where injected  pain, tingling, numbness in the hands or feet  redness, swelling, or sores on hands or feet  stomach pain  unusual bleeding Side effects that usually do not require medical attention (report  to your doctor or health care professional if they continue or are bothersome):  changes in finger or toe nails  diarrhea  dry or itchy skin  hair loss  headache  loss of appetite  sensitivity of eyes to the light  stomach upset  unusually teary eyes This list may not describe all possible side effects. Call your doctor for medical advice about side effects. You may report side effects to FDA at 1-800-FDA-1088. Where should I keep my medicine? This drug is given in a hospital or clinic and will not be stored at home. NOTE: This sheet is a summary. It may not cover all possible information. If you have questions about this medicine, talk to your doctor, pharmacist, or health care provider.  2021 Elsevier/Gold Standard (2019-03-10 15:00:03)

## 2020-09-09 ENCOUNTER — Telehealth: Payer: Self-pay | Admitting: *Deleted

## 2020-09-09 ENCOUNTER — Telehealth: Payer: Self-pay

## 2020-09-09 ENCOUNTER — Other Ambulatory Visit: Payer: Self-pay

## 2020-09-09 ENCOUNTER — Encounter (HOSPITAL_COMMUNITY): Payer: Self-pay

## 2020-09-09 NOTE — Telephone Encounter (Signed)
I spoke with Sheryl Pratt, She is feeling well today.  I let her know Dr Burr Medico has reached out to Dr Lisbeth Renshaw in rad/onc.  She told me she has a virtual visit with him next week.

## 2020-09-09 NOTE — Telephone Encounter (Signed)
I spoke with Sheryl Pratt,  She is not

## 2020-09-09 NOTE — Progress Notes (Signed)
REFERRING PROVIDER: [name], MD [place],  [place] [address]  PRIMARY PROVIDER:  Lorrene Reid, PA-C  PRIMARY REASON FOR VISIT:  1. Gastric adenocarcinoma ([place])   2. Family history of pancreatic cancer   3. Family history of gastric cancer   4. Family history of ovarian cancer   5. Family history of skin cancer     HISTORY OF PRESENT ILLNESS:   Sheryl Pratt, a 55 y.o. female, was seen for a [facility] cancer genetics consultation at the request of Dr. Burr Medico due to a personal and family history of cancer.  Sheryl Pratt presents to clinic today to discuss the possibility of a hereditary predisposition to cancer, to discuss genetic testing, and to further clarify her future cancer risks, as well as potential cancer risks for family members.   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  The treatment plan includes chemotherapy.    CANCER HISTORY:  Oncology History Overview Note  Cancer Staging No matching staging information was found for the patient.    Gastric adenocarcinoma ([place])  07/30/2020 Imaging   CT Abdomen and Pelvis with Contrast  IMPRESSION: 1. Large hypervascular soft tissue mass occupies the pelvis, compressing the urinary bladder and GI structures. No definite connection of this mass is seen to the vaginal cuff. The ovaries are not identified. 2. Enlarged left adrenal gland. Given the presence of soft tissue mass in the pelvis, this may represent metastatic disease to the adrenal gland. 3. Further evaluation with MRI of the abdomen and pelvis may be considered. Surgical consultation is also advised. 4. Right lower pole nonobstructive nephrolithiasis. 5. Aortic atherosclerosis.     08/09/2020 Procedure   Exploratory laparotomy with right salpingo-oophorectomy, omentectomy washings, peritoneal biopsies, and omentectomy under the care of Dr. Berline Lopes    08/09/2020 Pathology Results    Clinical History: Pelvic mass ([facility])      FINAL MICROSCOPIC DIAGNOSIS:   A. ADNEXAL MASS, RIGHT, SALPINGO OOPHORECTOMY:  - Adenocarcinoma with signet ring cell features.  - No ovarian surface involvement identified.  - Benign Fallopian tube.  - See comment.   The right adnexal mass is 17.8 cm in greatest dimension and is diffusely  involved by adenocarcinoma with diffuse signet ring cell features.  Immunohistochemistry shows positivity with cytokeratin 7 and cytokeratin  20.  The tumor is negative with estrogen receptor, GATA3, GCDFP, CD56,  chromogranin, synaptic ficin, calretinin, inhibin, Napsin A, TTF-1, WT1  and PAX 8.  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  Possible primary sites  include upper gastrointestinal tract and pancreas.   08/19/2020 Initial Diagnosis   Adenocarcinoma ([place])   08/26/2020 Procedure   Upper Endoscopy by Dr Ardis Hughs  IMPRESSION - There were no overtly neoplastic findings. - There was mild, non-specific gastritis and bulbar duodenitis. Both sites were biopsied given large pelvic mass pathology.   08/26/2020 Pathology Results   Surgical [P], gastric antrum and gastric body bx - POORLY DIFFERENTIATED ADENOCARCINOMA WITH SIGNET RING FEATURES. - SEE MICROSCOPIC DESCRIPTION.    08/26/2020 Cancer Staging   Staging form: Stomach, AJCC 8th Edition - Clinical stage from 08/26/2020: Stage IVB (cTX, cN0, pM1) - Signed by [name], MD on 09/06/2020 Stage prefix: Initial diagnosis Total positive nodes: 0 Histologic grade (G): G3 Histologic grading system: 3 grade system   08/29/2020 PET scan   IMPRESSION: 1. Dominant finding is multifocal hypermetabolic skeletal metastasis. Lesions are  subtly evident on CT with subtle sclerosis. Lesions include the pelvis, spine and ribs. 2. Hypermetabolic metastasis in the RIGHT femoral neck with sclerosis may be at risk for pathologic fracture. 3. Resection of  large pelvic mass. No residual carcinoma evident within the pelvis. 4. Benign LEFT adrenal adenoma. 5. Diffuse mild metabolic activity in the gastric fundus and proximal body is nonspecific. Recommend correlation with recent upper endoscopy.     09/08/2020 -  Chemotherapy   First-line FOLFOX q2weeks starting 09/08/20      RISK FACTORS:  Menarche was at age 31.  First live birth at age 34.  OCP use for approximately 2 years.  Ovaries intact: no.  Hysterectomy: yes.  HRT use: 0 years. Colonoscopy: yes; most recent in 2022. Mammogram within the last year: no. Up to date with pelvic exams: yes.  Past Medical History:  Diagnosis Date  . Allergy   . Arthritis   . Bloating   . Breast mass, right   . Cancer ([place])    metastaic to bones and abd, May 2022  . Constipation   . Family history of gastric cancer 09/08/2020  . Family history of ovarian cancer 09/08/2020  . Family history of pancreatic cancer 09/08/2020  . Family history of skin cancer 09/08/2020  . Migraine   . Pelvic mass   . PONV (postoperative nausea and vomiting)   . Seasonal allergies   . Vitamin D deficiency     Past Surgical History:  Procedure Laterality Date  . APPENDECTOMY    . AUGMENTATION MAMMAPLASTY Bilateral   . BREAST BIOPSY Right 2014   fibroadenoma  . BREAST SURGERY  18 years ago   augmentation.  . IR IMAGING GUIDED PORT INSERTION  09/06/2020  . LAPAROTOMY  08/09/2020   Procedure: EXPLORATORY LAPAROTOMY, MASS EXCISION, PERITONEAL BIOPSIES;  Surgeon: [name], MD;  Location: [facility];  Service: Gynecology;;  . OMENTECTOMY N/A 08/09/2020   Procedure: OMENTECTOMY;  Surgeon: [name], MD;  Location: [facility];  Service: Gynecology;  Laterality: N/A;  . SALPINGOOPHORECTOMY Right 08/09/2020   Procedure: OPEN RIGHT SALPINGO OOPHORECTOMY;  Surgeon: [name], MD;  Location: [facility];  Service: Gynecology;  Laterality: Right;  Marland Kitchen VAGINAL HYSTERECTOMY  2015    Social History    Socioeconomic History  . Marital status: Married    Spouse name: Oaklynn Stierwalt  . Number of children: Not on file  . Years of education: Not on file  . Highest education level: Not on file  Occupational History  . Not on file  Tobacco Use  . Smoking status: Current Every Day Smoker    Packs/day: 0.25    Years: 10.00    Pack years: 2.50    Types: Cigarettes  . Smokeless tobacco: Never Used  . Tobacco comment: 1-2 cigs QD  Vaping Use  . Vaping Use: Never used  Substance and Sexual Activity  . Alcohol use: Yes    Comment: occasional  . Drug use: No  . Sexual activity: Yes    Birth control/protection: Surgical  Other Topics Concern  . Not on file  Social History Narrative  . Not on file   Social Determinants of Health   Financial Resource Strain: Not on file  Food Insecurity: Not on file  Transportation Needs: Not on file  Physical Activity: Not on file  Stress: Not on file  Social Connections: Not on file     FAMILY HISTORY:  We obtained a detailed, 4-generation family history.  Significant diagnoses are  listed below: Family History  Problem Relation Age of Onset  . Pancreatic cancer Brother        d. 42  . Gastric cancer Paternal Uncle        d. 91  . Skin cancer Paternal Grandmother        metastatic; dx unknown age  . Pancreatic cancer Other        mother's half brother's son; d. 4s  . Skin cancer Other        mother's half brother  . Ovarian cancer Cousin        paternal cousin; d. 79s  . Uterine cancer Neg Hx   . Breast cancer Neg Hx   . Colon cancer Neg Hx     Sheryl Pratt is unaware of previous family history of genetic testing for hereditary cancer risks. There is no reported Ashkenazi Jewish ancestry. There is no known consanguinity.  GENETIC COUNSELING ASSESSMENT: Sheryl Pratt is a 55 y.o. female with a personal and family history of cancer which is somewhat suggestive of a hereditary cancer syndrome and predisposition to cancer given the presence  of related cancers in the family and diagnoses at a young age. We, therefore, discussed and recommended the following at today's visit.   DISCUSSION: We discussed that 5 - 10% of cancer is hereditary, with most cases of hereditary gastric cancer associated with mutations in the lynch syndrome genes or CDH1.  There are other genes that can be associated with hereditary pancreatic or ovarian cancer syndromes, such as BRCA1/2.  There are several other genes associated with hereditary gastric, pancreatic, and ovarian cancer syndromes. We discussed that testing is beneficial for several reasons including knowing how to follow individuals for their cancer risks and understanding if other family members could be at risk for cancer and allowing them to undergo genetic testing.   We reviewed the characteristics, features and inheritance patterns of hereditary cancer syndromes. We also discussed genetic testing, including the appropriate family members to test, the process of testing, insurance coverage and turn-around-time for results. We discussed the implications of a negative, positive, carrier and/or variant of uncertain significant result. We recommended Sheryl Pratt pursue genetic testing for a panel that includes genes associated with gastric, pancreatic, ovarian, and skin cancers.   The CustomNext-Cancer +RNAinsight Panel offered by [facility] and includes sequencing and rearrangement analysis for the following 91 genes: AIP, ALK, APC, ATM, AXIN2, BAP1, BARD1, BLM, BMPR1A, BRCA1, BRCA2, BRIP1, CDC73, CDH1, CDK4, CDKN1B, CDKN2A, CHEK2, CTNNA1, DICER1, FANCC, FH, FLCN, GALNT12, KIF1B, LZTR1, MAX, MEN1, MET, MLH1, MRE11A, MSH2, MSH3, MSH6, MUTYH, NBN, NF1, NF2, NTHL1, PALB2, PHOX2B, PMS2, POT1, PRKAR1A, PTCH1, PTEN, RAD50, RAD51C, RAD51D, RB1, RECQL, RET, SDHA, SDHAF2, SDHB, SDHC, SDHD, SMAD4, SMARCA4, SMARCB1, SMARCE1, STK11, SUFU, TMEM127, TP53, TSC1, TSC2, VHL and XRCC2 (sequencing and deletion/duplication);  CASR, CFTR, CPA1, CTRC, EGFR, EGLN1, FAM175A, HOXB13, KIT, MITF, MLH3, PALLD, PDGFRA, POLD1, POLE, PRSS1, RINT1, RPS20, SPINK1 and TERT (sequencing only); EPCAM and GREM1 (deletion/duplication only). RNA data is routinely analyzed for use in variant interpretation for all genes.  Based on Sheryl Pratt's personal and family history of cancer, she meets medical criteria for genetic testing. Despite that she meets criteria, she may still have an out of pocket cost. We discussed that if her out of pocket cost for testing is over $100, the laboratory will contact her to discuss self-pay options and/or patient pay assistance programs.   PLAN: After considering the risks, benefits, and limitations, Sheryl Pratt provided informed consent to  pursue genetic testing and the blood sample was sent to [facility] for analysis of the CustomNext+RNAinsight Panel. Results should be available within approximately 3 weeks' time, at which point they will be disclosed by telephone to Sheryl Pratt, as will any additional recommendations warranted by these results. Sheryl Pratt will receive a summary of her genetic counseling visit and a copy of her results once available. This information will also be available in Epic.   Lastly, we encouraged Sheryl Pratt to remain in contact with cancer genetics annually so that we can continuously update the family history and inform her of any changes in cancer genetics and testing that may be of benefit for this family.   Sheryl Pratt questions were answered to her satisfaction today. Our contact information was provided should additional questions or concerns arise. Thank you for the referral and allowing [place] to share in the care of your patient.   Shalamar Plourde M. Joette Catching, [place], [facility] Genetic Counselor [email or web address] (P) [phone]  The patient was seen for a total of 30 minutes in face-to-face genetic counseling.  Drs. Magrinat, Lindi Adie and/or Burr Medico were available to discuss this case as  needed.    _______________________________________________________________________ For Office Staff:  Number of people involved in session: 1 Was an Intern/ student involved with case: no

## 2020-09-09 NOTE — Progress Notes (Signed)
error 

## 2020-09-10 ENCOUNTER — Inpatient Hospital Stay: Payer: 59

## 2020-09-10 ENCOUNTER — Other Ambulatory Visit: Payer: Self-pay

## 2020-09-10 VITALS — BP 100/67 | HR 68 | Temp 98.3°F | Resp 17

## 2020-09-10 DIAGNOSIS — C169 Malignant neoplasm of stomach, unspecified: Secondary | ICD-10-CM

## 2020-09-10 DIAGNOSIS — Z5111 Encounter for antineoplastic chemotherapy: Secondary | ICD-10-CM | POA: Diagnosis not present

## 2020-09-10 MED ORDER — SODIUM CHLORIDE 0.9% FLUSH
10.0000 mL | INTRAVENOUS | Status: DC | PRN
  Administered 2020-09-10: 10 mL
  Filled 2020-09-10: qty 10

## 2020-09-10 MED ORDER — HEPARIN SOD (PORK) LOCK FLUSH 100 UNIT/ML IV SOLN
500.0000 [IU] | Freq: Once | INTRAVENOUS | Status: AC | PRN
  Administered 2020-09-10: 500 [IU]
  Filled 2020-09-10: qty 5

## 2020-09-13 ENCOUNTER — Encounter: Payer: 59 | Admitting: Gynecologic Oncology

## 2020-09-13 ENCOUNTER — Encounter (HOSPITAL_COMMUNITY): Payer: Self-pay | Admitting: Hematology

## 2020-09-13 ENCOUNTER — Other Ambulatory Visit: Payer: Self-pay | Admitting: Hematology

## 2020-09-13 NOTE — Progress Notes (Signed)
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  PET 08/29/2020: Multifocal hypermetabolic skeletal metastasis.  Hypermetabolic metastasis in the right femoral neck with sclerosis may be at risk for pathologic fracture, no other primary or metastatic disease.  CT AP 07/30/2020: Large hypervascular soft tissue mass occupies the pelvis, compressing the urinary bladder and GI structures. No definite connection of this mass is seen to the vaginal cuff. The ovaries are not identified.  Enlarged left adrenal gland. Given the presence of soft tissue mass in the pelvis, this may represent metastatic disease to the adrenal gland.  Sites of Visceral and Bony Metastatic Disease: Right femoral neck, L5, L1, Left sacral ala, Shoulder girdles and ribs.    Past/Anticipated chemotherapy by medical oncology, if any:  Dr. Burr Medico 09/05/2020 -I discussed with stage IV cancer, her cancer is no longer curable but still treatable to control her disease and prolong her life.  -I recommend systemic treatment with chemo. I discussed first-line FOLFOX every 2 weeks. I reviewed side effects with her, chemo consent obtained. Plan to start chemo on 09/08/20. Plan to scan her after 3 months treatment.  -She has PD-L1 and [employer] One and other genomic testing pending. Based on results she may be eligible for immunotherapy for added efficacy. -plan to add nivo if PD-L1 positive   -F/u in 2 weeks with C2 chemo.    Pain on a scale of 0-10 is: None noted now.  She has pain in Right abdomen, headache, bilateral leg pains.  Has occasional pain in her right hip, worse with laying down and sitting.  She uses ibuprofen and tylenol to relieve her pains.     Ambulatory status? Walker? Wheelchair?: Ambulatory  SAFETY ISSUES:  Prior radiation? No  Pacemaker/ICD? No  Possible current pregnancy? Hysterectomy  Is the patient on methotrexate? No  Current  Complaints / other details:   -Genetic Counseling 09/08/2020  -Port Insertion

## 2020-09-14 ENCOUNTER — Encounter: Payer: Self-pay | Admitting: Radiation Oncology

## 2020-09-14 ENCOUNTER — Ambulatory Visit
Admission: RE | Admit: 2020-09-14 | Discharge: 2020-09-14 | Disposition: A | Discharge status: Home / Self Care | Payer: 59 | Source: Ambulatory Visit | Attending: Radiation Oncology | Admitting: Radiation Oncology

## 2020-09-14 ENCOUNTER — Other Ambulatory Visit: Payer: Self-pay

## 2020-09-14 ENCOUNTER — Encounter: Payer: Self-pay | Admitting: *Deleted

## 2020-09-14 VITALS — Ht 68.0 in | Wt 116.0 lb

## 2020-09-14 DIAGNOSIS — C169 Malignant neoplasm of stomach, unspecified: Secondary | ICD-10-CM

## 2020-09-14 NOTE — Progress Notes (Signed)
Radiation Oncology         [phone] ________________________________  Initial Outpatient Consultation - Conducted via telephone due to current COVID-19 concerns for limiting patient exposure  I spoke with the patient to conduct this consult visit via telephone to spare the patient unnecessary potential exposure in the healthcare setting during the current COVID-19 pandemic. The patient was notified in advance and was offered a [place] meeting to allow for face to face communication but unfortunately reported that they did not have the appropriate resources/technology to support such a visit and instead preferred to proceed with a telephone consult.    Name: Sheryl Pratt        MRN: [id number]  Date of Service: 09/14/2020 DOB: 14-May-1965  WU:[id number], Herb Grays, PA-C  [name], MD     REFERRING PHYSICIAN: [name], MD   DIAGNOSIS: The encounter diagnosis was Gastric adenocarcinoma [facility]).   HISTORY OF PRESENT ILLNESS: Sheryl Pratt is a 55 y.o. female seen at the request of Dr. 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  The patient was originally diagnosed With a mass in the pelvis in April 2022 and exploratory laparotomy with RSO omentectomy, washings and biopsies were performed with GYN oncology, final pathology revealed adenocarcinoma with signet ring features no ovarian surface involvement was identified and benign fallopian tube was noted, she underwent upper endoscopy with Dr. Ardis Hughs on 08/26/2020 no overt neoplastic findings were identified however mild gastritis and bulbar duodenitis were seen in both sites were biopsied.  The results of this were poorly differentiated adenocarcinoma with signet ring features.  Pet imaging showed hypermetabolic skeletal metastases including right femoral neck sclerosis interval resection of the pelvic mass without residual disease and diffuse activity in the gastric fundus.  She  was started on first-line FOLFOX on 09/08/2020.  When she saw Dr. Burr Medico on 09/06/2020 in preparation for chemotherapy, and due to painful bone metastases she is contacted today by phone to discuss options of palliative radiotherapy.     PREVIOUS RADIATION THERAPY: No   PAST MEDICAL HISTORY:  Past Medical History:  Diagnosis Date  . Allergy   . Arthritis   . Bloating   . Breast mass, right   . Cancer ([place])    metastaic to bones and abd, May 2022  . Constipation   . Family history of gastric cancer 09/08/2020  . Family history of ovarian cancer 09/08/2020  . Family history of pancreatic cancer 09/08/2020  . Family history of skin cancer 09/08/2020  . Migraine   . Pelvic mass   . PONV (postoperative nausea and vomiting)   . Seasonal allergies   . Vitamin D deficiency        PAST SURGICAL HISTORY: Past Surgical History:  Procedure Laterality Date  . APPENDECTOMY    . AUGMENTATION MAMMAPLASTY Bilateral   . BREAST BIOPSY Right 2014   fibroadenoma  . BREAST SURGERY  18 years ago   augmentation.  . IR IMAGING GUIDED PORT INSERTION  09/06/2020  . LAPAROTOMY  08/09/2020   Procedure: EXPLORATORY LAPAROTOMY, MASS EXCISION, PERITONEAL BIOPSIES;  Surgeon: [name], MD;  Location: [facility];  Service: Gynecology;;  . OMENTECTOMY N/A 08/09/2020   Procedure: OMENTECTOMY;  Surgeon: [name], MD;  Location: [facility];  Service: Gynecology;  Laterality: N/A;  . SALPINGOOPHORECTOMY Right 08/09/2020   Procedure: OPEN RIGHT SALPINGO OOPHORECTOMY;  Surgeon: [name], MD;  Location: [facility];  Service: Gynecology;  Laterality: Right;  Marland Kitchen VAGINAL HYSTERECTOMY  2015     FAMILY HISTORY:  Family History  Problem Relation Age of Onset  . Pancreatic cancer Brother        d. 43  . Gastric cancer Paternal Uncle        d. 43  . Skin cancer Paternal Grandmother        metastatic; dx unknown age  . Pancreatic cancer Other        mother's half brother's son; d. 76s  . Skin cancer  Other        mother's half brother  . Ovarian cancer Cousin        paternal cousin; d. 39s  . Uterine cancer Neg Hx   . Breast cancer Neg Hx   . Colon cancer Neg Hx      SOCIAL HISTORY:  reports that she has been smoking cigarettes. She has a 2.50 pack-year smoking history. She has never used smokeless tobacco. She reports current alcohol use. She reports that she does not use drugs. The patient is married and lives in [place].    ALLERGIES: Codeine and Wellbutrin [bupropion]   MEDICATIONS:  Current Outpatient Medications  Medication Sig Dispense Refill  . acetaminophen (TYLENOL) 325 MG tablet Take by mouth every 6 (six) hours as needed for mild pain.    . cholecalciferol (VITAMIN D3) 25 MCG (1000 UNIT) tablet Take 1,000 Units by mouth daily.    . diphenhydrAMINE (BENADRYL) 25 mg capsule Take 25 mg by mouth at bedtime as needed for allergies.    Marland Kitchen enoxaparin (LOVENOX) 40 MG/0.4ML injection Inject 0.4 mLs (40 mg total) into the skin daily for 27 days. 10.8 mL 0  . ibuprofen (ADVIL) 800 MG tablet Take 1 tablet (800 mg total) by mouth every 8 (eight) hours as needed for moderate pain. For AFTER surgery only 30 tablet 0  . Ibuprofen-diphenhydrAMINE HCl (IBUPROFEN PM) 200-25 MG CAPS Take 1 tablet by mouth at bedtime as needed (sleep).    Marland Kitchen ipratropium (ATROVENT) 0.03 % nasal spray Place 2 sprays into both nostrils every 12 (twelve) hours. (Patient not taking: No sig reported) 30 mL 0  . lidocaine-prilocaine (EMLA) cream Apply to affected area once (Patient not taking: Reported on 09/02/2020) 30 g 3  . Multiple Vitamin (MULTIVITAMIN) tablet Take 1 tablet by mouth daily.    . ondansetron (ZOFRAN) 8 MG tablet Take 1 tablet (8 mg total) by mouth 2 (two) times daily as needed for refractory nausea / vomiting. Start on day 3 after chemotherapy. (Patient not taking: Reported on 09/02/2020) 30 tablet 1  . oxyCODONE (OXY IR/ROXICODONE) 5 MG immediate release tablet Take 1 tablet (5 mg total) by  mouth every 4 (four) hours as needed for severe pain. For severe pain only, do not take and drive 10 tablet 0  . prochlorperazine (COMPAZINE) 10 MG tablet Take 1 tablet (10 mg total) by mouth every 6 (six) hours as needed (Nausea or vomiting). (Patient not taking: Reported on 09/02/2020) 30 tablet 1  . Psyllium (METAMUCIL PO) Take 1 Dose by mouth daily. (Patient not taking: No sig reported)    . senna-docusate (SENOKOT-S) 8.6-50 MG tablet Take 2 tablets by mouth at bedtime. Do not take if having diarrhea (Patient taking differently: Take 2 tablets by mouth daily. Do not take if having diarrhea) 60 tablet 0  . Tetrahydrozoline HCl (VISINE OP) Place 1 drop into both eyes daily as needed (itching /redness).    . traMADol (ULTRAM) 50 MG tablet  Take 1 tablet (50 mg total) by mouth every 6 (six) hours as needed for severe pain. For AFTER surgery, do not take and drive 10 tablet 0  . vitamin B-12 (CYANOCOBALAMIN) 1000 MCG tablet Take 1,000 mcg by mouth daily.    . vitamin C (ASCORBIC ACID) 500 MG tablet Take 500 mg by mouth daily.    . Vitamin D, Ergocalciferol, (DRISDOL) 1.25 MG (50000 UT) CAPS capsule Take one tablet wkly 12 capsule 3  . vitamin E 180 MG (400 UNITS) capsule Take 400 Units by mouth daily.     No current facility-administered medications for this encounter.     REVIEW OF SYSTEMS: On review of systems, the patient reports that she is doing pretty well overall. She did have nausea over the weekend a few days after her chemotherapy treatment. She reports hip pain that is in the right side and this is more noticeable with laying flat and with sitting. She denies progressive symptoms or pain with weight bearing. She denies any regular episodes of pain in her low back or spine, specifically she denies left lateralization with the disease seen in her left SI joint. She reports ibuprofen and tylenol alleviate her symptoms. She does have oxycodone to use but denies using this at this time. No other  complaints are verbalized.     PHYSICAL EXAM:  Wt Readings from Last 3 Encounters:  09/05/20 118 lb 12.8 oz (53.9 kg)  09/05/20 116 lb (52.6 kg)  09/02/20 116 lb (52.6 kg)   Unable to assess given encounter type.   ECOG = 0  0 - Asymptomatic (Fully active, able to carry on all predisease activities without restriction)  1 - Symptomatic but completely ambulatory (Restricted in physically strenuous activity but ambulatory and able to carry out work of a light or sedentary nature. For example, light housework, office work)  2 - Symptomatic, <50% in bed during the day (Ambulatory and capable of all self care but unable to carry out any work activities. Up and about more than 50% of waking hours)  3 - Symptomatic, >50% in bed, but not bedbound (Capable of only limited self-care, confined to bed or chair 50% or more of waking hours)  4 - Bedbound (Completely disabled. Cannot carry on any self-care. Totally confined to bed or chair)  5 - Death   Eustace Pen MM, Creech RH, Tormey DC, et al. [phone]). "Toxicity and response criteria of the [facility]". [place] Oncol. 5 (6): 649-55    LABORATORY DATA:  Lab Results  Component Value Date   WBC 9.5 09/08/2020   HGB 11.6 (L) 09/08/2020   HCT 34.8 (L) 09/08/2020   MCV 94.3 09/08/2020   PLT 244 09/08/2020   Lab Results  Component Value Date   NA 140 09/08/2020   K 4.1 09/08/2020   CL 104 09/08/2020   CO2 25 09/08/2020   Lab Results  Component Value Date   ALT 14 09/08/2020   AST 15 09/08/2020   ALKPHOS 245 (H) 09/08/2020   BILITOT 0.2 (L) 09/08/2020      RADIOGRAPHY: NM PET Image Initial (PI) Skull Base To Thigh  Result Date: 08/30/2020 CLINICAL DATA:  Initial treatment strategy for metastatic adenocarcinoma of unknown primary. Pelvic mass a with salpingectomy of RIGHT ovary. Krukenberg tumor of the RIGHT ovary. EXAM: NUCLEAR MEDICINE PET SKULL BASE TO THIGH TECHNIQUE: 6.4 mCi F-18 FDG was injected  intravenously. Full-ring PET imaging was performed from the skull base to thigh after the radiotracer.  CT data was obtained and used for attenuation correction and anatomic localization. Fasting blood glucose: 84 mg/dl COMPARISON:  None. FINDINGS: Mediastinal blood pool activity: SUV max 1.8 Liver activity: SUV max eight NECK: No hypermetabolic lymph nodes in the neck. Incidental CT findings: none CHEST: No hypermetabolic mediastinal or hilar nodes. No suspicious pulmonary nodules on the CT scan. Incidental CT findings: none ABDOMEN/PELVIS: Interval resection of the large pelvic mass. No hypermetabolic activity within the pelvis. No hypermetabolic pelvic or retroperitoneal lymph nodes. There is mild diffuse metabolic activity through the gastric fundus and body with SUV max equal 4.2 (image 106.) mild activity through the duodenum also with SUV max equal 4.2. No discrete lesion associated with this diffuse activity in the proximal GI tract. The LEFT adrenal gland has low attenuation consistent with9 adenoma. No metabolic activity. Incidental CT findings: none SKELETON: There is multifocal hypermetabolic skeletal lesions involving the pelvis and spine as well as shoulder girdles and ribs. Example lesion in the LEFT sacral ala with SUV max equal 5.2. Lesion essentially occult by CT with only hazy sclerosis within the medullary space (image 145). Example lesion in the L5 vertebral body with SUV max equal 3.9 example lesion at L1 with SUV max equal 4.7. There approximately 10 lesions in the spine. There is a lesion within the RIGHT femoral neck with SUV max equal 4.4. There is sclerosis associated this lesion on image 171. Incidental CT findings: none IMPRESSION: 1. Dominant finding is multifocal hypermetabolic skeletal metastasis. Lesions are subtly evident on CT with subtle sclerosis. Lesions include the pelvis, spine and ribs. 2. Hypermetabolic metastasis in the RIGHT femoral neck with sclerosis may be at risk for  pathologic fracture. 3. Resection of large pelvic mass. No residual carcinoma evident within the pelvis. 4. Benign LEFT adrenal adenoma. 5. Diffuse mild metabolic activity in the gastric fundus and proximal body is nonspecific. Recommend correlation with recent upper endoscopy. Electronically Signed   By: Suzy Bouchard M.D.   On: 08/30/2020 11:54   IR IMAGING GUIDED PORT INSERTION  Result Date: 09/06/2020 CLINICAL DATA:  Gastric carcinoma EXAM: TUNNELED PORT CATHETER PLACEMENT WITH ULTRASOUND AND FLUOROSCOPIC GUIDANCE FLUOROSCOPY TIME:  18 seconds; 1 mGy ANESTHESIA/SEDATION: Intravenous Fentanyl 124mcg and Versed 3mg  were administered as conscious sedation during continuous monitoring of the patient's level of consciousness and physiological / cardiorespiratory status by the radiology RN, with a total moderate sedation time of 15 minutes. TECHNIQUE: The procedure, risks, benefits, and alternatives were explained to the patient. Questions regarding the procedure were encouraged and answered. The patient understands and consents to the procedure. Patency of the right IJ vein was confirmed with ultrasound with image documentation. An appropriate skin site was determined. Skin site was marked. Region was prepped using maximum barrier technique including cap and mask, sterile gown, sterile gloves, large sterile sheet, and Chlorhexidine as cutaneous antisepsis. The region was infiltrated locally with 1% lidocaine. Under real-time ultrasound guidance, the right IJ vein was accessed with a 21 gauge micropuncture needle; the needle tip within the vein was confirmed with ultrasound image documentation. Needle was exchanged over a 018 guidewire for transitional dilator, and vascular measurement was performed. A small incision was made on the right anterior chest wall and a subcutaneous pocket fashioned. The low-profile power-injectable port was positioned and its catheter tunneled to the right IJ dermatotomy site. The  transitional dilator was exchanged over an Amplatz wire for a peel-away sheath, through which the port catheter, which had been trimmed to the appropriate length, was advanced and  positioned under fluoroscopy with its tip at the cavoatrial junction. Spot chest radiograph confirms good catheter position and no pneumothorax. The port was flushed per protocol. The pocket was closed with deep interrupted and subcuticular continuous 3-0 Monocryl sutures. The incisions were covered with Dermabond then covered with a sterile dressing. The patient tolerated the procedure well. COMPLICATIONS: COMPLICATIONS None immediate IMPRESSION: Technically successful right IJ power-injectable port catheter placement. Ready for routine use. Electronically Signed   By: Lucrezia Europe M.D.   On: 09/06/2020 15:26       IMPRESSION/PLAN: 1. Stage IV gastric adenocarcinoma with signet ring features metastatic to the ovary and bones. Dr. Lisbeth Renshaw discusses the pathology findings and reviews the nature of metastatic disease to the bones and the considerations of palliative radiotherapy to alleviate symptoms. It is also sometimes utilized to reduce risks of fracture. He reviews her PET scan however and does not feel that the location of her right hip disease poses a concern for impending fracture. Dr. Burr Medico is interested in avoiding delays in treatment systemically, so Dr. Lisbeth Renshaw would favor holding off on radiation at this time unless symptoms progressed. We discussed the risks, benefits, short, and long term effects of radiotherapy, as well as the palliative intent, and the patient is interested in following back up in a few weeks by phone. If she were to need radiation in the midst of chemotherapy a 3 week course would be offered. She is in agreement with this plan.    Given current concerns for patient exposure during the COVID-19 pandemic, this encounter was conducted via telephone.  The patient has provided two factor identification and has  given verbal consent for this type of encounter and has been advised to only accept a meeting of this type in a secure network environment. The time spent during this encounter was 60 minutes including preparation, discussion, and coordination of the patient's care. The attendants for this meeting include Blenda Nicely, RN, Dr. Lisbeth Renshaw, Hayden Pedro  and [name].  During the encounter,  Blenda Nicely, RN, Dr. Lisbeth Renshaw, and Hayden Pedro were located at [facility] Radiation Oncology Department.  Sheryl Pratt was located at home.    The above documentation reflects my direct findings during this shared patient visit. Please see the separate note by Dr. Lisbeth Renshaw on this date for the remainder of the patient's plan of care.    Carola Rhine, [facility]   **Disclaimer: This note was dictated with voice recognition software. Similar sounding words can inadvertently be transcribed and this note may contain transcription errors which may not have been corrected upon publication of note.**

## 2020-09-14 NOTE — Progress Notes (Signed)
[facility]   [phone] Fax:(336) [phone]   Clinic Follow up Note   Patient Care Team: Lorrene Reid, PA-C as PCP - General Christene Lye, MD (General Surgery) [facility], [place] [name], MD as Consulting Physician (Hematology and Oncology) [name], RN as Oncology Nurse Navigator [name], MD as Consulting Physician (Gynecologic Oncology)  Date of Service:  09/20/2020  CHIEF COMPLAINT: Metastatic GastricAdenocarcinoma  SUMMARY OF ONCOLOGIC HISTORY: Oncology History Overview Note  Cancer Staging No matching staging information was found for the patient.    Gastric adenocarcinoma ([place])  07/30/2020 Imaging   CT Abdomen and Pelvis with Contrast  IMPRESSION: 1. Large hypervascular soft tissue mass occupies the pelvis, compressing the urinary bladder and GI structures. No definite connection of this mass is seen to the vaginal cuff. The ovaries are not identified. 2. Enlarged left adrenal gland. Given the presence of soft tissue mass in the pelvis, this may represent metastatic disease to the adrenal gland. 3. Further evaluation with MRI of the abdomen and pelvis may be considered. Surgical consultation is also advised. 4. Right lower pole nonobstructive nephrolithiasis. 5. Aortic atherosclerosis.     08/09/2020 Procedure   Exploratory laparotomy with right salpingo-oophorectomy, omentectomy washings, peritoneal biopsies, and omentectomy under the care of Dr. Berline Lopes    08/09/2020 Pathology Results   Clinical History: Pelvic mass (crm)      FINAL MICROSCOPIC DIAGNOSIS:   A. ADNEXAL MASS, RIGHT, SALPINGO OOPHORECTOMY:  - Adenocarcinoma with signet ring cell features.  - No ovarian surface involvement identified.  - Benign Fallopian tube.  - See comment.   The right adnexal mass is 17.8 cm in greatest dimension and is diffusely  involved by adenocarcinoma with diffuse signet ring cell features.   Immunohistochemistry shows positivity with cytokeratin 7 and cytokeratin  20.  The tumor is negative with estrogen receptor, GATA3, GCDFP, CD56,  chromogranin, synaptic ficin, calretinin, inhibin, Napsin A, TTF-1, WT1  and PAX 8.  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  Possible primary sites  include upper gastrointestinal tract and pancreas.   08/19/2020 Initial Diagnosis   Adenocarcinoma ([place])   08/26/2020 Procedure   Upper Endoscopy by Dr Ardis Hughs  IMPRESSION - There were no overtly neoplastic findings. - There was mild, non-specific gastritis and bulbar duodenitis. Both sites were biopsied given large pelvic mass pathology.   08/26/2020 Pathology Results   Surgical [P], gastric antrum and gastric body bx - POORLY DIFFERENTIATED ADENOCARCINOMA WITH SIGNET RING FEATURES. - SEE MICROSCOPIC DESCRIPTION.    08/26/2020 Cancer Staging   Staging form: Stomach, AJCC 8th Edition - Clinical stage from 08/26/2020: Stage IVB (cTX, cN0, pM1) - Signed by [name], MD on 09/06/2020 Stage prefix: Initial diagnosis Total positive nodes: 0 Histologic grade (G): G3 Histologic grading system: 3 grade system   08/29/2020 PET scan   IMPRESSION: 1. Dominant finding is multifocal hypermetabolic skeletal metastasis. Lesions are subtly evident on CT with subtle sclerosis. Lesions include the pelvis, spine and ribs. 2. Hypermetabolic metastasis in the RIGHT femoral neck with sclerosis may be at risk for pathologic fracture. 3. Resection of large pelvic mass. No residual carcinoma evident within the pelvis. 4. Benign LEFT adrenal adenoma. 5. Diffuse mild metabolic activity in the gastric fundus and proximal body is nonspecific. Recommend correlation with recent upper endoscopy.     09/08/2020 -  Chemotherapy   First-line FOLFOX q2weeks starting 09/08/20       CURRENT THERAPY:  First-line FOLFOX q2weeksstarting 09/08/20  INTERVAL  HISTORY:  Sheryl Pratt is here for a follow up. She was last seen by me 09/05/20. She presents to the clinic alone.  She tolerated first cycle chemotherapy very well.  She has no nausea, vomiting, diarrhea, or low appetite, except one episode of gastric discomfort which resolved after taking antiemetics.  No significant cold sensitivity, or neuropathy.  Her pain in back and right hip are intermittent and overall tolerable.  She did consult Dr. Lisbeth Renshaw last week, and decided hold radiation for now.   All other systems were reviewed with the patient and are negative.  MEDICAL HISTORY:  Past Medical History:  Diagnosis Date  . Allergy   . Arthritis   . Bloating   . Breast mass, right   . Cancer ([place])    metastaic to bones and abd, May 2022  . Constipation   . Family history of gastric cancer 09/08/2020  . Family history of ovarian cancer 09/08/2020  . Family history of pancreatic cancer 09/08/2020  . Family history of skin cancer 09/08/2020  . Migraine   . Pelvic mass   . PONV (postoperative nausea and vomiting)   . Seasonal allergies   . Vitamin D deficiency     SURGICAL HISTORY: Past Surgical History:  Procedure Laterality Date  . APPENDECTOMY    . AUGMENTATION MAMMAPLASTY Bilateral   . BREAST BIOPSY Right 2014   fibroadenoma  . BREAST SURGERY  18 years ago   augmentation.  . IR IMAGING GUIDED PORT INSERTION  09/06/2020  . LAPAROTOMY  08/09/2020   Procedure: EXPLORATORY LAPAROTOMY, MASS EXCISION, PERITONEAL BIOPSIES;  Surgeon: [name], MD;  Location: [facility];  Service: Gynecology;;  . OMENTECTOMY N/A 08/09/2020   Procedure: OMENTECTOMY;  Surgeon: [name], MD;  Location: [facility];  Service: Gynecology;  Laterality: N/A;  . SALPINGOOPHORECTOMY Right 08/09/2020   Procedure: OPEN RIGHT SALPINGO OOPHORECTOMY;  Surgeon: [name], MD;  Location: [facility];  Service: Gynecology;  Laterality: Right;  Marland Kitchen VAGINAL HYSTERECTOMY  2015    I have reviewed the social  history and family history with the patient and they are unchanged from previous note.  ALLERGIES:  is allergic to codeine and wellbutrin [bupropion].  MEDICATIONS:  Current Outpatient Medications  Medication Sig Dispense Refill  . acetaminophen (TYLENOL) 325 MG tablet Take by mouth every 6 (six) hours as needed for mild pain.    . cholecalciferol (VITAMIN D3) 25 MCG (1000 UNIT) tablet Take 1,000 Units by mouth daily.    . diphenhydrAMINE (BENADRYL) 25 mg capsule Take 25 mg by mouth at bedtime as needed for allergies.    Marland Kitchen enoxaparin (LOVENOX) 40 MG/0.4ML injection Inject 0.4 mLs (40 mg total) into the skin daily for 27 days. 10.8 mL 0  . ibuprofen (ADVIL) 800 MG tablet Take 1 tablet (800 mg total) by mouth every 8 (eight) hours as needed for moderate pain. For AFTER surgery only 30 tablet 0  . Ibuprofen-diphenhydrAMINE HCl (IBUPROFEN PM) 200-25 MG CAPS Take 1 tablet by mouth at bedtime as needed (sleep).    Marland Kitchen ipratropium (ATROVENT) 0.03 % nasal spray Place 2 sprays into both nostrils every 12 (twelve) hours. (Patient not taking: No sig reported) 30 mL 0  . lidocaine-prilocaine (EMLA) cream Apply to affected area once (Patient not taking: No sig reported) 30 g 3  . Multiple Vitamin (MULTIVITAMIN) tablet Take 1 tablet by mouth daily.    . ondansetron (ZOFRAN) 8 MG tablet Take 1 tablet (8  mg total) by mouth 2 (two) times daily as needed for refractory nausea / vomiting. Start on day 3 after chemotherapy. (Patient not taking: No sig reported) 30 tablet 1  . oxyCODONE (OXY IR/ROXICODONE) 5 MG immediate release tablet Take 1 tablet (5 mg total) by mouth every 4 (four) hours as needed for severe pain. For severe pain only, do not take and drive 10 tablet 0  . prochlorperazine (COMPAZINE) 10 MG tablet Take 1 tablet (10 mg total) by mouth every 6 (six) hours as needed (Nausea or vomiting). 30 tablet 1  . Psyllium (METAMUCIL PO) Take 1 Dose by mouth daily. (Patient not taking: No sig reported)    .  senna-docusate (SENOKOT-S) 8.6-50 MG tablet Take 2 tablets by mouth at bedtime. Do not take if having diarrhea (Patient taking differently: Take 2 tablets by mouth daily. Do not take if having diarrhea) 60 tablet 0  . Tetrahydrozoline HCl (VISINE OP) Place 1 drop into both eyes daily as needed (itching /redness).    . traMADol (ULTRAM) 50 MG tablet Take 1 tablet (50 mg total) by mouth every 6 (six) hours as needed for severe pain. For AFTER surgery, do not take and drive 10 tablet 0  . vitamin B-12 (CYANOCOBALAMIN) 1000 MCG tablet Take 1,000 mcg by mouth daily.    . vitamin C (ASCORBIC ACID) 500 MG tablet Take 500 mg by mouth daily.    . Vitamin D, Ergocalciferol, (DRISDOL) 1.25 MG (50000 UT) CAPS capsule Take one tablet wkly 12 capsule 3  . vitamin E 180 MG (400 UNITS) capsule Take 400 Units by mouth daily.     No current facility-administered medications for this visit.    PHYSICAL EXAMINATION: ECOG PERFORMANCE STATUS: 1 - Symptomatic but completely ambulatory  Vitals:   09/20/20 1204  BP: 106/72  Pulse: 66  Resp: 17  Temp: 97.9 F (36.6 C)  SpO2: 100%   Filed Weights   09/20/20 1204  Weight: 120 lb 1.6 oz (54.5 kg)    GENERAL:alert, no distress and comfortable SKIN: skin color, texture, turgor are normal, no rashes or significant lesions EYES: normal, Conjunctiva are pink and non-injected, sclera clear NECK: supple, thyroid normal size, non-tender, without nodularity LYMPH:  no palpable lymphadenopathy in the cervical, axillary  LUNGS: clear to auscultation and percussion with normal breathing effort HEART: regular rate & rhythm and no murmurs and no lower extremity edema ABDOMEN:abdomen soft, non-tender and normal bowel sounds Musculoskeletal:no cyanosis of digits and no clubbing  NEURO: alert & oriented x 3 with fluent speech, no focal motor/sensory deficits  LABORATORY DATA:  I have reviewed the data as listed CBC Latest Ref Rng & Units 09/20/2020 09/08/2020 09/05/2020   WBC 4.0 - 10.5 K/uL 7.2 9.5 7.3  Hemoglobin 12.0 - 15.0 g/dL 10.7(L) 11.6(L) 11.5(L)  Hematocrit 36.0 - 46.0 % 32.2(L) 34.8(L) 34.2(L)  Platelets 150 - 400 K/uL 232 244 248     CMP Latest Ref Rng & Units 09/20/2020 09/08/2020 09/05/2020  Glucose 70 - 99 mg/dL 88 204(H) 142(H)  BUN 6 - 20 mg/dL '14 17 17  ' Creatinine 0.44 - 1.00 mg/dL 0.56 0.72 0.66  Sodium 135 - 145 mmol/L 141 140 138  Potassium 3.5 - 5.1 mmol/L 4.1 4.1 4.1  Chloride 98 - 111 mmol/L 107 104 102  CO2 22 - 32 mmol/L '26 25 27  ' Calcium 8.9 - 10.3 mg/dL 9.3 9.3 9.2  Total Protein 6.5 - 8.1 g/dL 6.8 7.3 7.5  Total Bilirubin 0.3 - 1.2 mg/dL 0.4 0.2(L) <0.2(L)  Alkaline Phos 38 - 126 U/L 260(H) 245(H) 220(H)  AST 15 - 41 U/L 31 15 14(L)  ALT 0 - 44 U/L 41 14 15      RADIOGRAPHIC STUDIES: I have personally reviewed the radiological images as listed and agreed with the findings in the report. No results found.   ASSESSMENT & PLAN:  Sheryl Pratt is a 55 y.o. female with   1.Primary Gastric adenocarcinoma with signet ring features, metastaticto right ovary and bones,stage IV -CT 07/30/20 revealedlarge hypervascular soft tissue massthatoccupies the pelvis,compressing the urinarybladder and GI structures. Enlarged left adrenal gland.  -On4/19/22, she underwentdiagnostic laparoscopywith rightsalpingo-oophorectomy, omentectomy washings, peritoneal biopsies, and omentectomyto assure the origin of the mass.The large ovarian mass showedadenocarcinoma with signet ring cell features, IHC studies liver upper GI primary.Biopsy of otherpelvic tissue was negative for metastasis. -[place] with Dr. Ardis Hughs on 08/26/20.Final pathology showed poorly differentiated adenofibroma with signet rings features. This is the same as her ovarian pathology.  -Her 08/29/20 PET showedmultifocal hypermetabolic skeletal metastasis,Hypermetabolic metastasis in the RIGHT femoral neck with sclerosis may be at risk for pathologic  fracture,no other primary or metastatic disease.I reviewed the scan images with pt today  -I discussed with stage IV cancer, her cancer is no longer curable but still treatable to control her disease and prolong her life.  -I started her on first-line chemo with FOLFOX every 2 weeks on 09/08/20. Goal of therapy is palliative. Plan to scan her after 3 months treatment.  -I reviewed her [facility] results with her today, which showed MSI stable disease with low mutation rate, no palpable mutation.  Her PD-L1 was negative, so she is not a candidate for immunotherapy as first-line. -She tolerated first cycle chemotherapy FOLFOX very well, will continue every 2 weeks. -Lab reviewed, adequate for treatment, she is scheduled for cycle 2 chemotherapy on 6/2 -f/u in 2 weeks   2. Bulging near abdominal incision/Post Op Pain, Lower back pain secondary to bone mets -We reached out to [facility] who reviewed the imaging. GynOnc believes this is likely normal. They directly reached out to the patient and reassured her this was normal.She will f/u with them. -The patient will continue to take NSAIDs for pain control. If needed, she has a prescription for tramadol and oxycodone.  -She notes intermittent lower back pain, which is likely secondary to bone mets. Will monitor and offer palliative Radiation if indicated.  -she also has mild intermittent right lower abdominal discomfort, likely related to her surgery  3. Family History ofCancer -The patient's brother had pancreatic cancer, diagnosed at age 86.She notes unknown cancer in another family member.  -She is interested in genetic testing given her son and daughter. I will send referral (08/31/20)   PLAN:  -Labs reviewed and adequate to proceed with C2 FOLFOX on 6/2 -FO results reviewed, she is not a candidate for first-line nivolumab -Lab follow-up and cycle 3 chemotherapy on 6/16    No problem-specific Assessment & Plan notes found for this  encounter.   No orders of the defined types were placed in this encounter.  All questions were answered. The patient knows to call the clinic with any problems, questions or concerns. No barriers to learning was detected. The total time spent in the appointment was 30 minutes.     [name], MD 09/20/2020   I, Joslyn Devon, am acting as scribe for [name], MD.   I have reviewed the above documentation for accuracy and completeness, and I agree with the above.

## 2020-09-14 NOTE — Progress Notes (Signed)
[facility] Psychosocial Distress Screening Clinical Social Work  Clinical Social Work was referred by distress screening protocol.  The patient scored a 7 on the Psychosocial Distress Thermometer which indicates moderate distress. Patient has been contacted by Clinical Social Work and appropriate interventions and referrals completed.  Patient has CSW contact information and has been encouraged to call with needs or concerns.    ONCBCN DISTRESS SCREENING 09/14/2020  Screening Type Initial Screening  Distress experienced in past week (1-10) 7  Family Problem type Partner;Children  Emotional problem type Nervousness/Anxiety;Adjusting to illness   Johnnye Lana, MSW, LCSW, OSW-C Clinical Social Worker [facility] [phone]

## 2020-09-20 ENCOUNTER — Other Ambulatory Visit: Payer: Self-pay

## 2020-09-20 ENCOUNTER — Encounter: Payer: Self-pay | Admitting: Hematology

## 2020-09-20 ENCOUNTER — Inpatient Hospital Stay (HOSPITAL_BASED_OUTPATIENT_CLINIC_OR_DEPARTMENT_OTHER): Payer: 59 | Admitting: Hematology

## 2020-09-20 ENCOUNTER — Inpatient Hospital Stay: Payer: 59

## 2020-09-20 VITALS — BP 106/72 | HR 66 | Temp 97.9°F | Resp 17 | Ht 68.0 in | Wt 120.1 lb

## 2020-09-20 DIAGNOSIS — C169 Malignant neoplasm of stomach, unspecified: Secondary | ICD-10-CM | POA: Diagnosis not present

## 2020-09-20 DIAGNOSIS — Z95828 Presence of other vascular implants and grafts: Secondary | ICD-10-CM | POA: Insufficient documentation

## 2020-09-20 DIAGNOSIS — C801 Malignant (primary) neoplasm, unspecified: Secondary | ICD-10-CM

## 2020-09-20 DIAGNOSIS — C7961 Secondary malignant neoplasm of right ovary: Secondary | ICD-10-CM

## 2020-09-20 DIAGNOSIS — Z5111 Encounter for antineoplastic chemotherapy: Secondary | ICD-10-CM | POA: Diagnosis not present

## 2020-09-20 LAB — CBC WITH DIFFERENTIAL (CANCER CENTER ONLY)
Abs Immature Granulocytes: 0.02 10*3/uL (ref 0.00–0.07)
Basophils Absolute: 0 10*3/uL (ref 0.0–0.1)
Basophils Relative: 0 %
Eosinophils Absolute: 0.1 10*3/uL (ref 0.0–0.5)
Eosinophils Relative: 2 %
HCT: 32.2 % — ABNORMAL LOW (ref 36.0–46.0)
Hemoglobin: 10.7 g/dL — ABNORMAL LOW (ref 12.0–15.0)
Immature Granulocytes: 0 %
Lymphocytes Relative: 35 %
Lymphs Abs: 2.5 10*3/uL (ref 0.7–4.0)
MCH: 31.3 pg (ref 26.0–34.0)
MCHC: 33.2 g/dL (ref 30.0–36.0)
MCV: 94.2 fL (ref 80.0–100.0)
Monocytes Absolute: 0.7 10*3/uL (ref 0.1–1.0)
Monocytes Relative: 9 %
Neutro Abs: 3.9 10*3/uL (ref 1.7–7.7)
Neutrophils Relative %: 54 %
Platelet Count: 232 10*3/uL (ref 150–400)
RBC: 3.42 MIL/uL — ABNORMAL LOW (ref 3.87–5.11)
RDW: 13.9 % (ref 11.5–15.5)
WBC Count: 7.2 10*3/uL (ref 4.0–10.5)
nRBC: 0 % (ref 0.0–0.2)

## 2020-09-20 LAB — CMP (CANCER CENTER ONLY)
ALT: 41 U/L (ref 0–44)
AST: 31 U/L (ref 15–41)
Albumin: 3.5 g/dL (ref 3.5–5.0)
Alkaline Phosphatase: 260 U/L — ABNORMAL HIGH (ref 38–126)
Anion gap: 8 (ref 5–15)
BUN: 14 mg/dL (ref 6–20)
CO2: 26 mmol/L (ref 22–32)
Calcium: 9.3 mg/dL (ref 8.9–10.3)
Chloride: 107 mmol/L (ref 98–111)
Creatinine: 0.56 mg/dL (ref 0.44–1.00)
GFR, Estimated: >60 mL/min (ref >60)
Glucose, Bld: 88 mg/dL (ref 70–99)
Potassium: 4.1 mmol/L (ref 3.5–5.1)
Sodium: 141 mmol/L (ref 135–145)
Total Bilirubin: 0.4 mg/dL (ref 0.3–1.2)
Total Protein: 6.8 g/dL (ref 6.5–8.1)

## 2020-09-20 MED ORDER — HEPARIN SOD (PORK) LOCK FLUSH 100 UNIT/ML IV SOLN
500.0000 [IU] | Freq: Once | INTRAVENOUS | Status: AC
  Administered 2020-09-20: 500 [IU]
  Filled 2020-09-20: qty 5

## 2020-09-20 MED ORDER — SODIUM CHLORIDE 0.9% FLUSH
10.0000 mL | Freq: Once | INTRAVENOUS | Status: AC
Start: 2020-09-20 — End: 2020-09-20
  Administered 2020-09-20: 10 mL
  Filled 2020-09-20: qty 10

## 2020-09-21 ENCOUNTER — Telehealth: Payer: Self-pay | Admitting: Hematology

## 2020-09-21 NOTE — Telephone Encounter (Signed)
Scheduled follow-up appointment per 5/31 los. Patient is aware.

## 2020-09-22 ENCOUNTER — Other Ambulatory Visit: Payer: Self-pay

## 2020-09-22 ENCOUNTER — Inpatient Hospital Stay: Payer: 59 | Attending: Gynecologic Oncology

## 2020-09-22 VITALS — BP 94/59 | HR 79 | Temp 98.4°F | Resp 16

## 2020-09-22 DIAGNOSIS — Z79899 Other long term (current) drug therapy: Secondary | ICD-10-CM | POA: Diagnosis not present

## 2020-09-22 DIAGNOSIS — C7951 Secondary malignant neoplasm of bone: Secondary | ICD-10-CM | POA: Diagnosis not present

## 2020-09-22 DIAGNOSIS — Z5111 Encounter for antineoplastic chemotherapy: Secondary | ICD-10-CM | POA: Insufficient documentation

## 2020-09-22 DIAGNOSIS — C169 Malignant neoplasm of stomach, unspecified: Secondary | ICD-10-CM | POA: Diagnosis present

## 2020-09-22 DIAGNOSIS — Z923 Personal history of irradiation: Secondary | ICD-10-CM | POA: Insufficient documentation

## 2020-09-22 DIAGNOSIS — C7961 Secondary malignant neoplasm of right ovary: Secondary | ICD-10-CM | POA: Diagnosis present

## 2020-09-22 DIAGNOSIS — G893 Neoplasm related pain (acute) (chronic): Secondary | ICD-10-CM | POA: Diagnosis not present

## 2020-09-22 MED ORDER — PALONOSETRON HCL INJECTION 0.25 MG/5ML
INTRAVENOUS | Status: AC
  Filled 2020-09-22: qty 5

## 2020-09-22 MED ORDER — DEXTROSE 5 % IV SOLN
Freq: Once | INTRAVENOUS | Status: AC
  Filled 2020-09-22: qty 250

## 2020-09-22 MED ORDER — SODIUM CHLORIDE 0.9 % IV SOLN
2400.0000 mg/m2 | INTRAVENOUS | Status: DC
  Administered 2020-09-22: 3800 mg via INTRAVENOUS
  Filled 2020-09-22: qty 76

## 2020-09-22 MED ORDER — PALONOSETRON HCL INJECTION 0.25 MG/5ML
0.2500 mg | Freq: Once | INTRAVENOUS | Status: AC
  Administered 2020-09-22: 0.25 mg via INTRAVENOUS

## 2020-09-22 MED ORDER — OXALIPLATIN CHEMO INJECTION 100 MG/20ML
85.0000 mg/m2 | Freq: Once | INTRAVENOUS | Status: AC
  Administered 2020-09-22: 135 mg via INTRAVENOUS
  Filled 2020-09-22: qty 20

## 2020-09-22 MED ORDER — LEUCOVORIN CALCIUM INJECTION 350 MG
400.0000 mg/m2 | Freq: Once | INTRAVENOUS | Status: AC
  Administered 2020-09-22: 636 mg via INTRAVENOUS
  Filled 2020-09-22: qty 31.8

## 2020-09-22 MED ORDER — SODIUM CHLORIDE 0.9 % IV SOLN
10.0000 mg | Freq: Once | INTRAVENOUS | Status: AC
  Administered 2020-09-22: 10 mg via INTRAVENOUS
  Filled 2020-09-22: qty 10

## 2020-09-22 NOTE — Patient Instructions (Signed)
[facility] ONCOL[facility]    Discharge Instructions: Thank you for choosing [facility] to provide your oncology and hematology care.   If you have a lab appointment with the [facility], please go directly to the [facility] and check in at the registration area.   Wear comfortable clothing and clothing appropriate for easy access to any Portacath or PICC line.   We strive to give you quality time with your provider. You may need to reschedule your appointment if you arrive late (15 or more minutes).  Arriving late affects you and other patients whose appointments are after yours.  Also, if you miss three or more appointments without notifying the office, you may be dismissed from the clinic at the provider's discretion.      For prescription refill requests, have your pharmacy contact our office and allow 72 hours for refills to be completed.    Today you received the following chemotherapy and/or immunotherapy agents: oxaliplatin, leucovorin, and fluorouracil.      To help prevent nausea and vomiting after your treatment, we encourage you to take your nausea medication as directed.  BELOW ARE SYMPTOMS THAT SHOULD BE REPORTED IMMEDIATELY: . *FEVER GREATER THAN 100.4 F (38 C) OR HIGHER . *CHILLS OR SWEATING . *NAUSEA AND VOMITING THAT IS NOT CONTROLLED WITH YOUR NAUSEA MEDICATION . *UNUSUAL SHORTNESS OF BREATH . *UNUSUAL BRUISING OR BLEEDING . *URINARY PROBLEMS (pain or burning when urinating, or frequent urination) . *BOWEL PROBLEMS (unusual diarrhea, constipation, pain near the anus) . TENDERNESS IN MOUTH AND THROAT WITH OR WITHOUT PRESENCE OF ULCERS (sore throat, sores in mouth, or a toothache) . UNUSUAL RASH, SWELLING OR PAIN  . UNUSUAL VAGINAL DISCHARGE OR ITCHING   Items with * indicate a potential emergency and should be followed up as soon as possible or go to the Emergency Department if any problems should occur.  Please show the  CHEMOTHERAPY ALERT CARD or IMMUNOTHERAPY ALERT CARD at check-in to the Emergency Department and triage nurse.  Should you have questions after your visit or need to cancel or reschedule your appointment, please contact [facility]  Dept: [phone]  and follow the prompts.  Office hours are 8:00 a.m. to 4:30 p.m. Monday - Friday. Please note that voicemails left after 4:00 p.m. may not be returned until the following business day.  We are closed weekends and major holidays. You have access to a nurse at all times for urgent questions. Please call the main number to the clinic Dept: [phone] and follow the prompts.   For any non-urgent questions, you may also contact your provider using MyChart. We now offer e-Visits for anyone 63 and older to request care online for non-urgent symptoms. For details visit [email or web address].   Also download the MyChart app! Go to the app store, search "MyChart", open the app, select [facility], and log in with your MyChart username and password.  Due to Covid, a mask is required upon entering the hospital/clinic. If you do not have a mask, one will be given to you upon arrival. For doctor visits, patients may have 1 support person aged 31 or older with them. For treatment visits, patients cannot have anyone with them due to current Covid guidelines and our immunocompromised population.

## 2020-09-23 ENCOUNTER — Other Ambulatory Visit: Payer: 59

## 2020-09-24 ENCOUNTER — Other Ambulatory Visit: Payer: Self-pay

## 2020-09-24 ENCOUNTER — Inpatient Hospital Stay: Payer: 59

## 2020-09-24 VITALS — BP 94/63 | HR 70 | Temp 97.7°F | Resp 17

## 2020-09-24 DIAGNOSIS — Z5111 Encounter for antineoplastic chemotherapy: Secondary | ICD-10-CM | POA: Diagnosis not present

## 2020-09-24 MED ORDER — HEPARIN SOD (PORK) LOCK FLUSH 100 UNIT/ML IV SOLN
500.0000 [IU] | Freq: Once | INTRAVENOUS | Status: AC | PRN
  Administered 2020-09-24: 500 [IU]
  Filled 2020-09-24: qty 5

## 2020-09-24 MED ORDER — SODIUM CHLORIDE 0.9% FLUSH
10.0000 mL | INTRAVENOUS | Status: DC | PRN
  Administered 2020-09-24: 10 mL
  Filled 2020-09-24: qty 10

## 2020-09-24 NOTE — Patient Instructions (Signed)

## 2020-09-26 ENCOUNTER — Telehealth: Payer: Self-pay | Admitting: Genetic Counselor

## 2020-09-26 ENCOUNTER — Ambulatory Visit: Payer: Self-pay | Admitting: Genetic Counselor

## 2020-09-26 ENCOUNTER — Encounter: Payer: Self-pay | Admitting: Genetic Counselor

## 2020-09-26 DIAGNOSIS — Z1379 Encounter for other screening for genetic and chromosomal anomalies: Secondary | ICD-10-CM | POA: Insufficient documentation

## 2020-09-26 DIAGNOSIS — Z800 Family history of malignant neoplasm of digestive organs: Secondary | ICD-10-CM

## 2020-09-26 DIAGNOSIS — Z808 Family history of malignant neoplasm of other organs or systems: Secondary | ICD-10-CM

## 2020-09-26 DIAGNOSIS — C169 Malignant neoplasm of stomach, unspecified: Secondary | ICD-10-CM

## 2020-09-26 DIAGNOSIS — Z8041 Family history of malignant neoplasm of ovary: Secondary | ICD-10-CM

## 2020-09-26 NOTE — Progress Notes (Signed)
HPI:  Sheryl Pratt was previously seen in the [facility] due to a personal and family history of cancer and concerns regarding a hereditary predisposition to cancer. Please refer to our prior cancer genetics clinic note for more information regarding our discussion, assessment and recommendations, at the time. Sheryl Pratt recent genetic test results were disclosed to her, as were recommendations warranted by these results. These results and recommendations are discussed in more detail below.  CANCER HISTORY:  Oncology History Overview Note  Cancer Staging No matching staging information was found for the patient.    Gastric adenocarcinoma ([place])  07/30/2020 Imaging   CT Abdomen and Pelvis with Contrast  IMPRESSION: 1. Large hypervascular soft tissue mass occupies the pelvis, compressing the urinary bladder and GI structures. No definite connection of this mass is seen to the vaginal cuff. The ovaries are not identified. 2. Enlarged left adrenal gland. Given the presence of soft tissue mass in the pelvis, this may represent metastatic disease to the adrenal gland. 3. Further evaluation with MRI of the abdomen and pelvis may be considered. Surgical consultation is also advised. 4. Right lower pole nonobstructive nephrolithiasis. 5. Aortic atherosclerosis.     08/09/2020 Procedure   Exploratory laparotomy with right salpingo-oophorectomy, omentectomy washings, peritoneal biopsies, and omentectomy under the care of Dr. Berline Lopes    08/09/2020 Pathology Results   Clinical History: Pelvic mass (crm)      FINAL MICROSCOPIC DIAGNOSIS:   A. ADNEXAL MASS, RIGHT, SALPINGO OOPHORECTOMY:  - Adenocarcinoma with signet ring cell features.  - No ovarian surface involvement identified.  - Benign Fallopian tube.  - See comment.   The right adnexal mass is 17.8 cm in greatest dimension and is diffusely  involved by adenocarcinoma with diffuse signet ring cell features.   Immunohistochemistry shows positivity with cytokeratin 7 and cytokeratin  20.  The tumor is negative with estrogen receptor, GATA3, GCDFP, CD56,  chromogranin, synaptic ficin, calretinin, inhibin, Napsin A, TTF-1, WT1  and PAX 8.  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  Possible primary sites  include upper gastrointestinal tract and pancreas.   08/19/2020 Initial Diagnosis   Adenocarcinoma ([place])   08/26/2020 Procedure   Upper Endoscopy by Dr Ardis Hughs  IMPRESSION - There were no overtly neoplastic findings. - There was mild, non-specific gastritis and bulbar duodenitis. Both sites were biopsied given large pelvic mass pathology.   08/26/2020 Pathology Results   Surgical [P], gastric antrum and gastric body bx - POORLY DIFFERENTIATED ADENOCARCINOMA WITH SIGNET RING FEATURES. - SEE MICROSCOPIC DESCRIPTION.    08/26/2020 Cancer Staging   Staging form: Stomach, AJCC 8th Edition - Clinical stage from 08/26/2020: Stage IVB (cTX, cN0, pM1) - Signed by [name], MD on 09/06/2020 Stage prefix: Initial diagnosis Total positive nodes: 0 Histologic grade (G): G3 Histologic grading system: 3 grade system   08/29/2020 PET scan   IMPRESSION: 1. Dominant finding is multifocal hypermetabolic skeletal metastasis. Lesions are subtly evident on CT with subtle sclerosis. Lesions include the pelvis, spine and ribs. 2. Hypermetabolic metastasis in the RIGHT femoral neck with sclerosis may be at risk for pathologic fracture. 3. Resection of large pelvic mass. No residual carcinoma evident within the pelvis. 4. Benign LEFT adrenal adenoma. 5. Diffuse mild metabolic activity in the gastric fundus and proximal body is nonspecific. Recommend correlation with recent upper endoscopy.     09/08/2020 -  Chemotherapy   First-line FOLFOX q2weeks starting 09/08/20    09/26/2020 Genetic  Testing   Negative hereditary cancer genetic testing: no pathogenic  variants detected in [facility] CustomNext-Cancer +RNAinsight Panel.  Variants of uncertain significance detected in LZTR1 at  p.K452E (c.1354A>G) and MLH1 at p.P747S (c.2239C>T).  The report date is September 21, 2020.   The CustomNext-Cancer +RNAinsight Panel offered by [facility] and includes sequencing and rearrangement analysis for the following 91 genes: AIP, ALK, APC, ATM, AXIN2, BAP1, BARD1, BLM, BMPR1A, BRCA1, BRCA2, BRIP1, CDC73, CDH1, CDK4, CDKN1B, CDKN2A, CHEK2, CTNNA1, DICER1, FANCC, FH, FLCN, GALNT12, KIF1B, LZTR1, MAX, MEN1, MET, MLH1, MRE11A, MSH2, MSH3, MSH6, MUTYH, NBN, NF1, NF2, NTHL1, PALB2, PHOX2B, PMS2, POT1, PRKAR1A, PTCH1, PTEN, RAD50, RAD51C, RAD51D, RB1, RECQL, RET, SDHA, SDHAF2, SDHB, SDHC, SDHD, SMAD4, SMARCA4, SMARCB1, SMARCE1, STK11, SUFU, TMEM127, TP53, TSC1, TSC2, VHL and XRCC2 (sequencing and deletion/duplication); CASR, CFTR, CPA1, CTRC, EGFR, EGLN1, FAM175A, HOXB13, KIT, MITF, MLH3, PALLD, PDGFRA, POLD1, POLE, PRSS1, RINT1, RPS20, SPINK1 and TERT (sequencing only); EPCAM and GREM1 (deletion/duplication only). RNA data is routinely analyzed for use in variant interpretation for all genes.     FAMILY HISTORY:  We obtained a detailed, 4-generation family history.  Significant diagnoses are listed below: Family History  Problem Relation Age of Onset  . Pancreatic cancer Brother        d. 66  . Gastric cancer Paternal Uncle        d. 74  . Skin cancer Paternal Grandmother        metastatic; dx unknown age  . Pancreatic cancer Other        mother's half brother's son; d. 76s  . Skin cancer Other        mother's half brother  . Ovarian cancer Cousin        paternal cousin; d. 2s  . Uterine cancer Neg Hx   . Breast cancer Neg Hx   . Colon cancer Neg Hx      Ms. Tedeschi is unaware of previous family history of genetic testing for hereditary cancer risks. There is no reported Ashkenazi Jewish ancestry. There is no known consanguinity.  GENETIC TEST RESULTS: Genetic  testing reported out on September 21, 2020.  The [facility] CustomNext-Cancer +RNAinsight Panel found no pathogenic mutations. The CustomNext-Cancer +RNAinsight Panel offered by [facility] and includes sequencing and rearrangement analysis for the following 91 genes: AIP, ALK, APC, ATM, AXIN2, BAP1, BARD1, BLM, BMPR1A, BRCA1, BRCA2, BRIP1, CDC73, CDH1, CDK4, CDKN1B, CDKN2A, CHEK2, CTNNA1, DICER1, FANCC, FH, FLCN, GALNT12, KIF1B, LZTR1, MAX, MEN1, MET, MLH1, MRE11A, MSH2, MSH3, MSH6, MUTYH, NBN, NF1, NF2, NTHL1, PALB2, PHOX2B, PMS2, POT1, PRKAR1A, PTCH1, PTEN, RAD50, RAD51C, RAD51D, RB1, RECQL, RET, SDHA, SDHAF2, SDHB, SDHC, SDHD, SMAD4, SMARCA4, SMARCB1, SMARCE1, STK11, SUFU, TMEM127, TP53, TSC1, TSC2, VHL and XRCC2 (sequencing and deletion/duplication); CASR, CFTR, CPA1, CTRC, EGFR, EGLN1, FAM175A, HOXB13, KIT, MITF, MLH3, PALLD, PDGFRA, POLD1, POLE, PRSS1, RINT1, RPS20, SPINK1 and TERT (sequencing only); EPCAM and GREM1 (deletion/duplication only). RNA data is routinely analyzed for use in variant interpretation for all genes.  The test report has been scanned into EPIC and is located under the Molecular Pathology section of the Results Review tab.  A portion of the result report is included below for reference.     We discussed with Ms. Cobbins that because current genetic testing is not perfect, it is possible there may be a gene mutation in one of these genes that current testing cannot detect, but that chance is small.  We also discussed, that there could be another gene that has not yet been discovered,  or that we have not yet tested, that is responsible for the cancer diagnoses in the family. It is also possible there is a hereditary cause for the cancer in the family that Ms. Mccrackin did not inherit and therefore was not identified in her testing.  Therefore, it is important to remain in touch with cancer genetics in the future so that we can continue to offer Ms. Paules the most up to date genetic testing.    Genetic testing did identify two variants of uncertain significance (VUS) - one in the LZTR1 gene called  p.K452E (c.1354A>G) and a second in the MLH1 gene called p.P747S (c.2239C>T). At this time, it is unknown if these variants are associated with increased cancer risk or if they are normal findings, but most variants such as these get reclassified to being inconsequential. They should not be used to make medical management decisions. With time, we suspect the lab will determine the significance of these variants, if any. If we do learn more about them, we will try to contact Ms. Filla to discuss it further. However, it is important to stay in touch with [place] periodically and keep the address and phone number up to date.  ADDITIONAL GENETIC TESTING: We discussed with Ms. Codd that her genetic testing was fairly extensive.  If there are genes identified to increase cancer risk that can be analyzed in the future, we would be happy to discuss and coordinate this testing at that time.    CANCER SCREENING RECOMMENDATIONS: Ms. Hefley test result is considered negative (normal).  This means that we have not identified a hereditary cause for her personal history of cancer at this time.   While reassuring, this does not definitively rule out a hereditary predisposition to cancer. It is still possible that there could be genetic mutations that are undetectable by current technology. There could be genetic mutations in genes that have not been tested or identified to increase cancer risk.  Therefore, it is recommended she continue to follow the cancer management and screening guidelines provided by her oncology and primary healthcare provider.   An individual's cancer risk and medical management are not determined by genetic test results alone. Overall cancer risk assessment incorporates additional factors, including personal medical history, family history, and any available genetic information that may result  in a personalized plan for cancer prevention and surveillance  RECOMMENDATIONS FOR FAMILY MEMBERS:  Individuals in this family might be at some increased risk of developing cancer, over the general population risk, simply due to the family history of cancer.  We recommended women in this family have a yearly mammogram beginning at age 41, or 65 years younger than the earliest onset of cancer, an annual clinical breast exam, and perform monthly breast self-exams. Women in this family should also have a gynecological exam as recommended by their primary provider. Family members should be referred for colonoscopy starting at age 61.  It is also possible there is a hereditary cause for the cancer in Ms. Helser's family that she did not inherit and therefore was not identified in her.  Based on Ms. Liddicoat's family history of pancreatic cancer in her brother, we recommended her siblings have genetic counseling and testing. Ms. Tibbs will let us know if we can be of any assistance in coordinating genetic counseling and/or testing for these family member.   FOLLOW-UP: Lastly, we discussed with Ms. Hearns that cancer genetics is a rapidly advancing field and it is possible that new genetic tests will  be appropriate for her and/or her family members in the future. We encouraged her to remain in contact with cancer genetics on an annual basis so we can update her personal and family histories and let her know of advances in cancer genetics that may benefit this family.   Our contact number was provided. Ms. Mesch questions were answered to her satisfaction, and she knows she is welcome to call us at anytime with additional questions or concerns.    Cari M. Joette Catching, [place], [facility] Genetic Counselor [email or web address] (P) [phone]

## 2020-09-26 NOTE — Telephone Encounter (Signed)
Revealed negative genetic testing and variants of uncertain significance in MLH1 and LZTR1.  Discussed that we do not know why she has gastric cancer or why there is cancer in the family. It could be sporadic/familial, due to a different gene that we are not testing, or maybe our current technology may not be able to pick something up.  It will be important for her to keep in contact with genetics to keep up with whether additional testing may be needed.

## 2020-09-27 ENCOUNTER — Other Ambulatory Visit: Payer: Self-pay

## 2020-09-27 ENCOUNTER — Encounter: Payer: Self-pay | Admitting: Physician Assistant

## 2020-09-27 ENCOUNTER — Telehealth: Payer: Self-pay | Admitting: Hematology

## 2020-09-27 ENCOUNTER — Ambulatory Visit: Payer: 59 | Admitting: Physician Assistant

## 2020-09-27 ENCOUNTER — Ambulatory Visit (INDEPENDENT_AMBULATORY_CARE_PROVIDER_SITE_OTHER): Payer: 59 | Admitting: Physician Assistant

## 2020-09-27 VITALS — BP 96/62 | HR 73 | Temp 98.2°F | Ht 68.0 in | Wt 119.2 lb

## 2020-09-27 DIAGNOSIS — E782 Mixed hyperlipidemia: Secondary | ICD-10-CM | POA: Diagnosis not present

## 2020-09-27 DIAGNOSIS — Z01419 Encounter for gynecological examination (general) (routine) without abnormal findings: Secondary | ICD-10-CM

## 2020-09-27 DIAGNOSIS — Z1231 Encounter for screening mammogram for malignant neoplasm of breast: Secondary | ICD-10-CM

## 2020-09-27 DIAGNOSIS — Z0000 Encounter for general adult medical examination without abnormal findings: Secondary | ICD-10-CM

## 2020-09-27 DIAGNOSIS — E559 Vitamin D deficiency, unspecified: Secondary | ICD-10-CM

## 2020-09-27 NOTE — Patient Instructions (Signed)
Preventive Care 84-57 Years Old, Female Preventive care refers to lifestyle choices and visits with your health care provider that can promote health and wellness. This includes:  A yearly physical exam. This is also called an annual wellness visit.  Regular dental and eye exams.  Immunizations.  Screening for certain conditions.  Healthy lifestyle choices, such as: ? Eating a healthy diet. ? Getting regular exercise. ? Not using drugs or products that contain nicotine and tobacco. ? Limiting alcohol use. What can I expect for my preventive care visit? Physical exam Your health care provider will check your:  Height and weight. These may be used to calculate your BMI (body mass index). BMI is a measurement that tells if you are at a healthy weight.  Heart rate and blood pressure.  Body temperature.  Skin for abnormal spots. Counseling Your health care provider may ask you questions about your:  Past medical problems.  Family's medical history.  Alcohol, tobacco, and drug use.  Emotional well-being.  Home life and relationship well-being.  Sexual activity.  Diet, exercise, and sleep habits.  Work and work Statistician.  Access to firearms.  Method of birth control.  Menstrual cycle.  Pregnancy history. What immunizations do I need? Vaccines are usually given at various ages, according to a schedule. Your health care provider will recommend vaccines for you based on your age, medical history, and lifestyle or other factors, such as travel or where you work.   What tests do I need? Blood tests  Lipid and cholesterol levels. These may be checked every 5 years, or more often if you are over 3 years old.  Hepatitis C test.  Hepatitis B test. Screening  Lung cancer screening. You may have this screening every year starting at age 73 if you have a 30-pack-year history of smoking and currently smoke or have quit within the past 15 years.  Colorectal cancer  screening. ? All adults should have this screening starting at age 52 and continuing until age 17. ? Your health care provider may recommend screening at age 49 if you are at increased risk. ? You will have tests every 1-10 years, depending on your results and the type of screening test.  Diabetes screening. ? This is done by checking your blood sugar (glucose) after you have not eaten for a while (fasting). ? You may have this done every 1-3 years.  Mammogram. ? This may be done every 1-2 years. ? Talk with your health care provider about when you should start having regular mammograms. This may depend on whether you have a family history of breast cancer.  BRCA-related cancer screening. This may be done if you have a family history of breast, ovarian, tubal, or peritoneal cancers.  Pelvic exam and Pap test. ? This may be done every 3 years starting at age 10. ? Starting at age 11, this may be done every 5 years if you have a Pap test in combination with an HPV test. Other tests  STD (sexually transmitted disease) testing, if you are at risk.  Bone density scan. This is done to screen for osteoporosis. You may have this scan if you are at high risk for osteoporosis. Talk with your health care provider about your test results, treatment options, and if necessary, the need for more tests. Follow these instructions at home: Eating and drinking  Eat a diet that includes fresh fruits and vegetables, whole grains, lean protein, and low-fat dairy products.  Take vitamin and mineral supplements  as recommended by your health care provider.  Do not drink alcohol if: ? Your health care provider tells you not to drink. ? You are pregnant, may be pregnant, or are planning to become pregnant.  If you drink alcohol: ? Limit how much you have to 0-1 drink a day. ? Be aware of how much alcohol is in your drink. In the [place] one drink equals one 12 oz bottle of beer (355 mL), one 5 oz glass of  wine (148 mL), or one 1 oz glass of hard liquor (44 mL).   Lifestyle  Take daily care of your teeth and gums. Brush your teeth every morning and night with fluoride toothpaste. Floss one time each day.  Stay active. Exercise for at least 30 minutes 5 or more days each week.  Do not use any products that contain nicotine or tobacco, such as cigarettes, e-cigarettes, and chewing tobacco. If you need help quitting, ask your health care provider.  Do not use drugs.  If you are sexually active, practice safe sex. Use a condom or other form of protection to prevent STIs (sexually transmitted infections).  If you do not wish to become pregnant, use a form of birth control. If you plan to become pregnant, see your health care provider for a prepregnancy visit.  If told by your health care provider, take low-dose aspirin daily starting at age 50.  Find healthy ways to cope with stress, such as: ? Meditation, yoga, or listening to music. ? Journaling. ? Talking to a trusted person. ? Spending time with friends and family. Safety  Always wear your seat belt while driving or riding in a vehicle.  Do not drive: ? If you have been drinking alcohol. Do not ride with someone who has been drinking. ? When you are tired or distracted. ? While texting.  Wear a helmet and other protective equipment during sports activities.  If you have firearms in your house, make sure you follow all gun safety procedures. What's next?  Visit your health care provider once a year for an annual wellness visit.  Ask your health care provider how often you should have your eyes and teeth checked.  Stay up to date on all vaccines. This information is not intended to replace advice given to you by your health care provider. Make sure you discuss any questions you have with your health care provider. Document Revised: 01/12/2020 Document Reviewed: 12/19/2017 Elsevier Patient Education  2021 Elsevier Inc.  

## 2020-09-27 NOTE — Telephone Encounter (Signed)
Scheduled follow-up appointments per 5/31 los. Patient is aware. 

## 2020-09-27 NOTE — Progress Notes (Signed)
Subjective:     Sheryl Pratt is a 55 y.o. female and is here for a comprehensive physical exam. The patient reports problems - nausea related to chemotherapy.  Social History   Socioeconomic History  . Marital status: Married    Spouse name: Fancy Dunkley  . Number of children: Not on file  . Years of education: Not on file  . Highest education level: Not on file  Occupational History  . Not on file  Tobacco Use  . Smoking status: Current Every Day Smoker    Packs/day: 0.25    Years: 10.00    Pack years: 2.50    Types: Cigarettes  . Smokeless tobacco: Never Used  . Tobacco comment: 1-2 cigs QD  Vaping Use  . Vaping Use: Never used  Substance and Sexual Activity  . Alcohol use: Yes    Comment: occasional  . Drug use: No  . Sexual activity: Yes    Birth control/protection: Surgical  Other Topics Concern  . Not on file  Social History Narrative  . Not on file   Social Determinants of Health   Financial Resource Strain: Not on file  Food Insecurity: Not on file  Transportation Needs: Not on file  Physical Activity: Not on file  Stress: Not on file  Social Connections: Not on file  Intimate Partner Violence: Not At Risk  . Fear of Current or Ex-Partner: No  . Emotionally Abused: No  . Physically Abused: No  . Sexually Abused: No   Health Maintenance  Topic Date Due  . Pneumococcal Vaccine 42-42 Years old (1 of 4 - PCV13) Never done  . HIV Screening  Never done  . PAP SMEAR-Modifier  Never done  . MAMMOGRAM  07/30/2014  . COVID-19 Vaccine (4 - Booster for Moderna series) 08/08/2020  . INFLUENZA VACCINE  11/21/2020  . TETANUS/TDAP  01/10/2030  . Hepatitis C Screening  Completed  . Zoster Vaccines- Shingrix  Completed  . HPV VACCINES  Aged Out  . COLONOSCOPY (Pts 45-48yrs Insurance coverage will need to be confirmed)  Discontinued    The following portions of the patient's history were reviewed and updated as appropriate: allergies, current medications, past  family history, past medical history, past social history, past surgical history and problem list.  Review of Systems Pertinent items noted in HPI and remainder of comprehensive ROS otherwise negative.   Objective:    BP 96/62   Pulse 73   Temp 98.2 F (36.8 C)   Ht 5\' 8"  (1.727 m)   Wt 119 lb 3.2 oz (54.1 kg)   LMP 07/02/2012   SpO2 (!) 73%   BMI 18.12 kg/m  General appearance: alert, cooperative and no distress Head: Normocephalic, without obvious abnormality, atraumatic Eyes: conjunctivae/corneas clear. PERRL, EOM's intact. Fundi benign. Ears: normal TM's and external ear canals both ears Nose: Nares normal. Septum midline. Mucosa normal. No drainage or sinus tenderness. Throat: lips, mucosa, and tongue normal; teeth and gums normal Neck: no adenopathy, no JVD, supple, symmetrical, trachea midline and thyroid not enlarged, symmetric, no tenderness/mass/nodules Back: symmetric, no curvature. ROM normal. No CVA tenderness. Lungs: clear to auscultation bilaterally Breasts: Inspection negative, No nipple retraction or dimpling, No nipple discharge or bleeding, No axillary or supraclavicular adenopathy, Normal to palpation without dominant masses Heart: regular rate and rhythm, no rub and no murmur Abdomen: normal findings: bowel sounds normal and abnormal findings:  well-healed surgical scar of lower mid-abdomen Extremities: no edema, redness or tenderness in the calves or thighs Pulses:  2+ and symmetric Skin: normal and no edema or varicosities - lower leg(s) bilateral Lymph nodes: Cervical, supraclavicular, and axillary nodes normal. Neurologic: Grossly normal    Assessment:    Healthy female exam.     Plan:  -Will obtain fasting labs today. Will defer mammogram to fall for next mobile mammo bus. UTD colonoscopy, Tdap, Shingrix. -Continue to follow up with various specialists. -Praised for reducing tobacco use and encourage tobacco cessation. -Follow up in 6 months for reg  OV- Vit D, allergies  See After Visit Summary for Counseling Recommendations

## 2020-09-28 ENCOUNTER — Telehealth: Payer: Self-pay | Admitting: Radiation Oncology

## 2020-09-28 ENCOUNTER — Encounter: Payer: Self-pay | Admitting: Hematology

## 2020-09-28 LAB — CBC
Hematocrit: 38.5 % (ref 34.0–46.6)
Hemoglobin: 13.2 g/dL (ref 11.1–15.9)
MCH: 31.2 pg (ref 26.6–33.0)
MCHC: 34.3 g/dL (ref 31.5–35.7)
MCV: 91 fL (ref 79–97)
Platelets: 251 10*3/uL (ref 150–450)
RBC: 4.23 x10E6/uL (ref 3.77–5.28)
RDW: 13.1 % (ref 11.7–15.4)
WBC: 4.8 10*3/uL (ref 3.4–10.8)

## 2020-09-28 LAB — COMPREHENSIVE METABOLIC PANEL
ALT: 75 IU/L — ABNORMAL HIGH (ref 0–32)
AST: 63 IU/L — ABNORMAL HIGH (ref 0–40)
Albumin/Globulin Ratio: 1.6 (ref 1.2–2.2)
Albumin: 4.6 g/dL (ref 3.8–4.9)
Alkaline Phosphatase: 331 IU/L — ABNORMAL HIGH (ref 44–121)
BUN/Creatinine Ratio: 21 (ref 9–23)
BUN: 12 mg/dL (ref 6–24)
Bilirubin Total: 0.4 mg/dL (ref 0.0–1.2)
CO2: 21 mmol/L (ref 20–29)
Calcium: 9.5 mg/dL (ref 8.7–10.2)
Chloride: 103 mmol/L (ref 96–106)
Creatinine, Ser: 0.57 mg/dL (ref 0.57–1.00)
Globulin, Total: 2.8 g/dL (ref 1.5–4.5)
Glucose: 89 mg/dL (ref 65–99)
Potassium: 4.6 mmol/L (ref 3.5–5.2)
Sodium: 140 mmol/L (ref 134–144)
Total Protein: 7.4 g/dL (ref 6.0–8.5)
eGFR: 107 mL/min/{1.73_m2} (ref >59)

## 2020-09-28 LAB — LIPID PANEL
Chol/HDL Ratio: 3.8 ratio (ref 0.0–4.4)
Cholesterol, Total: 236 mg/dL — ABNORMAL HIGH (ref 100–199)
HDL: 62 mg/dL (ref >39)
LDL Chol Calc (NIH): 148 mg/dL — ABNORMAL HIGH (ref 0–99)
Triglycerides: 146 mg/dL (ref 0–149)
VLDL Cholesterol Cal: 26 mg/dL (ref 5–40)

## 2020-09-28 LAB — HEMOGLOBIN A1C
Est. average glucose Bld gHb Est-mCnc: 114 mg/dL
Hgb A1c MFr Bld: 5.6 % (ref 4.8–5.6)

## 2020-09-28 LAB — TSH: TSH: 2.07 u[IU]/mL (ref 0.450–4.500)

## 2020-09-28 LAB — VITAMIN D 25 HYDROXY (VIT D DEFICIENCY, FRACTURES): Vit D, 25-Hydroxy: 46.1 ng/mL (ref 30.0–100.0)

## 2020-09-28 NOTE — Telephone Encounter (Signed)
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  She states that her pain is in her right hip and upper leg, the lowest portion of her back, as well as her mid back.  By pet imaging she has no disease in the proximal right femur, sacrum, L spine and an area in the thoracic spine.  She denies any neurologic deficits.  She is still managing her symptoms with over-the-counter Tylenol and hopes to avoid narcotics but does have these on hand from her prior surgery that she could use.  I reviewed her case with Dr. Lisbeth Renshaw and he would like to offer palliative radiotherapy, he would recommend 15 fractions given her ongoing chemotherapy.  She will come in tomorrow for simulation, we will use guidewire over the site in her mid back to make sure that this correlates to her PET scan findings in the T-spine.

## 2020-09-29 ENCOUNTER — Other Ambulatory Visit: Payer: Self-pay

## 2020-09-29 ENCOUNTER — Telehealth: Payer: Self-pay | Admitting: Hematology

## 2020-09-29 ENCOUNTER — Ambulatory Visit
Admission: RE | Admit: 2020-09-29 | Discharge: 2020-09-29 | Disposition: A | Discharge status: Home / Self Care | Payer: 59 | Source: Ambulatory Visit | Attending: Radiation Oncology | Admitting: Radiation Oncology

## 2020-09-29 DIAGNOSIS — C7951 Secondary malignant neoplasm of bone: Secondary | ICD-10-CM | POA: Insufficient documentation

## 2020-09-29 DIAGNOSIS — C169 Malignant neoplasm of stomach, unspecified: Secondary | ICD-10-CM | POA: Diagnosis present

## 2020-09-29 DIAGNOSIS — Z510 Encounter for antineoplastic radiation therapy: Secondary | ICD-10-CM | POA: Diagnosis present

## 2020-09-29 NOTE — Telephone Encounter (Signed)
Scheduled appointment per 06/08 sch msg. Patient is aware. 

## 2020-09-29 NOTE — Progress Notes (Signed)
Called patient to give lab results. She gave verbal understanding of lab results and will call back with any questions. Results have been forwarded to her oncologist.

## 2020-09-30 ENCOUNTER — Inpatient Hospital Stay: Payer: 59 | Admitting: Hematology

## 2020-09-30 ENCOUNTER — Encounter: Payer: Self-pay | Admitting: Hematology

## 2020-09-30 ENCOUNTER — Telehealth: Payer: Self-pay | Admitting: Pharmacist

## 2020-09-30 ENCOUNTER — Other Ambulatory Visit (HOSPITAL_COMMUNITY): Payer: Self-pay

## 2020-09-30 VITALS — BP 105/64 | HR 66 | Temp 97.8°F | Resp 17 | Ht 68.0 in | Wt 122.2 lb

## 2020-09-30 DIAGNOSIS — C786 Secondary malignant neoplasm of retroperitoneum and peritoneum: Secondary | ICD-10-CM | POA: Diagnosis not present

## 2020-09-30 DIAGNOSIS — R1900 Intra-abdominal and pelvic swelling, mass and lump, unspecified site: Secondary | ICD-10-CM

## 2020-09-30 DIAGNOSIS — C169 Malignant neoplasm of stomach, unspecified: Secondary | ICD-10-CM

## 2020-09-30 DIAGNOSIS — Z5111 Encounter for antineoplastic chemotherapy: Secondary | ICD-10-CM | POA: Diagnosis not present

## 2020-09-30 MED ORDER — CAPECITABINE 500 MG PO TABS: ORAL_TABLET | ORAL | 1 refills | Status: DC

## 2020-09-30 MED ORDER — OXYCODONE HCL 5 MG PO TABS
5.0000 mg | ORAL_TABLET | ORAL | 0 refills | Status: DC | PRN
Start: 2020-09-30 — End: 2021-02-09

## 2020-09-30 MED ORDER — CAPECITABINE 500 MG PO TABS
ORAL_TABLET | ORAL | 1 refills | Status: DC
  Filled 2020-09-30: qty 75, fill #0

## 2020-09-30 NOTE — Telephone Encounter (Signed)
Oral Oncology Pharmacist Encounter  Prior authorization for Xeloda (capecitabine) is not required per [employer]   Prescription is required to be filled through [facility]. Rx redirected for dispensing.    Oral Oncology Clinic will continue to follow.  Leron Croak, PharmD, BCPS Hematology/Oncology Clinical Pharmacist [facility] [phone] 09/30/2020 8:56 AM

## 2020-09-30 NOTE — Progress Notes (Signed)
[place]   [phone] Fax:([phone]) [phone]   Clinic Follow up Note   Patient Care Team: Lorrene Reid, PA-C as PCP - General Christene Lye, MD (General Surgery) [facility], [place] [name], MD as Consulting Physician (Hematology and Oncology) [name], RN as Oncology Nurse Navigator [name], MD as Consulting Physician (Gynecologic Oncology) 09/30/2020  CHIEF COMPLAINT: hip pain and back pain  SUMMARY OF ONCOLOGIC HISTORY: Oncology History Overview Note  Cancer Staging No matching staging information was found for the patient.    Gastric adenocarcinoma ([place])  07/30/2020 Imaging   CT Abdomen and Pelvis with Contrast  IMPRESSION: 1. Large hypervascular soft tissue mass occupies the pelvis, compressing the urinary bladder and GI structures. No definite connection of this mass is seen to the vaginal cuff. The ovaries are not identified. 2. Enlarged left adrenal gland. Given the presence of soft tissue mass in the pelvis, this may represent metastatic disease to the adrenal gland. 3. Further evaluation with MRI of the abdomen and pelvis may be considered. Surgical consultation is also advised. 4. Right lower pole nonobstructive nephrolithiasis. 5. Aortic atherosclerosis.     08/09/2020 Procedure   Exploratory laparotomy with right salpingo-oophorectomy, omentectomy washings, peritoneal biopsies, and omentectomy under the care of Dr. Berline Lopes    08/09/2020 Pathology Results   Clinical History: Pelvic mass (crm)      FINAL MICROSCOPIC DIAGNOSIS:   A. ADNEXAL MASS, RIGHT, SALPINGO OOPHORECTOMY:  - Adenocarcinoma with signet ring cell features.  - No ovarian surface involvement identified.  - Benign Fallopian tube.  - See comment.   The right adnexal mass is 17.8 cm in greatest dimension and is diffusely  involved by adenocarcinoma with diffuse signet ring cell features.  Immunohistochemistry shows  positivity with cytokeratin 7 and cytokeratin  20.  The tumor is negative with estrogen receptor, GATA3, GCDFP, CD56,  chromogranin, synaptic ficin, calretinin, inhibin, Napsin A, TTF-1, WT1  and PAX 8.  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  Possible primary sites  include upper gastrointestinal tract and pancreas.   08/19/2020 Initial Diagnosis   Adenocarcinoma ([place])   08/26/2020 Procedure   Upper Endoscopy by Dr Ardis Hughs  IMPRESSION - There were no overtly neoplastic findings. - There was mild, non-specific gastritis and bulbar duodenitis. Both sites were biopsied given large pelvic mass pathology.   08/26/2020 Pathology Results   Surgical [P], gastric antrum and gastric body bx - POORLY DIFFERENTIATED ADENOCARCINOMA WITH SIGNET RING FEATURES. - SEE MICROSCOPIC DESCRIPTION.    08/26/2020 Cancer Staging   Staging form: Stomach, AJCC 8th Edition - Clinical stage from 08/26/2020: Stage IVB (cTX, cN0, pM1) - Signed by [name], MD on 09/06/2020  Stage prefix: Initial diagnosis  Total positive nodes: 0  Histologic grade (G): G3  Histologic grading system: 3 grade system    08/29/2020 PET scan   IMPRESSION: 1. Dominant finding is multifocal hypermetabolic skeletal metastasis. Lesions are subtly evident on CT with subtle sclerosis. Lesions include the pelvis, spine and ribs. 2. Hypermetabolic metastasis in the RIGHT femoral neck with sclerosis may be at risk for pathologic fracture. 3. Resection of large pelvic mass. No residual carcinoma evident within the pelvis. 4. Benign LEFT adrenal adenoma. 5. Diffuse mild metabolic activity in the gastric fundus and proximal body is nonspecific. Recommend correlation with recent upper endoscopy.     09/08/2020 -  Chemotherapy   First-line FOLFOX q2weeks starting 09/08/20    09/26/2020 Genetic  Testing   Negative hereditary cancer genetic testing: no pathogenic variants detected  in [facility] CustomNext-Cancer +RNAinsight Panel.  Variants of uncertain significance detected in LZTR1 at  p.K452E (c.1354A>G) and MLH1 at p.P747S (c.2239C>T).  The report date is September 21, 2020.   The CustomNext-Cancer +RNAinsight Panel offered by [facility] and includes sequencing and rearrangement analysis for the following 91 genes: AIP, ALK, APC, ATM, AXIN2, BAP1, BARD1, BLM, BMPR1A, BRCA1, BRCA2, BRIP1, CDC73, CDH1, CDK4, CDKN1B, CDKN2A, CHEK2, CTNNA1, DICER1, FANCC, FH, FLCN, GALNT12, KIF1B, LZTR1, MAX, MEN1, MET, MLH1, MRE11A, MSH2, MSH3, MSH6, MUTYH, NBN, NF1, NF2, NTHL1, PALB2, PHOX2B, PMS2, POT1, PRKAR1A, PTCH1, PTEN, RAD50, RAD51C, RAD51D, RB1, RECQL, RET, SDHA, SDHAF2, SDHB, SDHC, SDHD, SMAD4, SMARCA4, SMARCB1, SMARCE1, STK11, SUFU, TMEM127, TP53, TSC1, TSC2, VHL and XRCC2 (sequencing and deletion/duplication); CASR, CFTR, CPA1, CTRC, EGFR, EGLN1, FAM175A, HOXB13, KIT, MITF, MLH3, PALLD, PDGFRA, POLD1, POLE, PRSS1, RINT1, RPS20, SPINK1 and TERT (sequencing only); EPCAM and GREM1 (deletion/duplication only). RNA data is routinely analyzed for use in variant interpretation for all genes.     CURRENT THERAPY: first line chemo FOLFOX every 2 weeks   INTERVAL HISTORY: She has severe fatigue after last cycle chemo, which lasted about 4-5 days, mild nausea, and constipation .  She is recovering well. Due to worsening b/l hip and low back pain, she was seen by rad/onc again yesterday and plan to start palliative radiation next Monday.  She has been taking Tylenol as needed, does have a few of tramadol and oxycodone at home.  No other new complaints.  All other systems were reviewed with the patient and are negative.  MEDICAL HISTORY:  Past Medical History:  Diagnosis Date   Allergy    Arthritis    Bloating    Breast mass, right    Cancer ([place])    metastaic to bones and abd, May 2022   Constipation    Family history of gastric cancer 09/08/2020   Family history of ovarian cancer 09/08/2020    Family history of pancreatic cancer 09/08/2020   Family history of skin cancer 09/08/2020   Migraine    Pelvic mass    PONV (postoperative nausea and vomiting)    Seasonal allergies    Vitamin D deficiency     SURGICAL HISTORY: Past Surgical History:  Procedure Laterality Date   APPENDECTOMY     AUGMENTATION MAMMAPLASTY Bilateral    BREAST BIOPSY Right 2014   fibroadenoma   BREAST SURGERY  18 years ago   augmentation.   IR IMAGING GUIDED PORT INSERTION  09/06/2020   LAPAROTOMY  08/09/2020   Procedure: EXPLORATORY LAPAROTOMY, MASS EXCISION, PERITONEAL BIOPSIES;  Surgeon: [name], MD;  Location: [facility];  Service: Gynecology;;   OMENTECTOMY N/A 08/09/2020   Procedure: OMENTECTOMY;  Surgeon: [name], MD;  Location: [facility];  Service: Gynecology;  Laterality: N/A;   SALPINGOOPHORECTOMY Right 08/09/2020   Procedure: OPEN RIGHT SALPINGO OOPHORECTOMY;  Surgeon: [name], MD;  Location: [facility];  Service: Gynecology;  Laterality: Right;   VAGINAL HYSTERECTOMY  2015    I have reviewed the social history and family history with the patient and they are unchanged from previous note.  ALLERGIES:  is allergic to codeine and wellbutrin [bupropion].  MEDICATIONS:  Current Outpatient Medications  Medication Sig Dispense Refill   acetaminophen (TYLENOL) 325 MG tablet Take by mouth every 6 (six) hours as needed for mild pain.     cholecalciferol (VITAMIN D3) 25 MCG (1000 UNIT) tablet Take 1,000 Units by  mouth daily.     diphenhydrAMINE (BENADRYL) 25 mg capsule Take 25 mg by mouth at bedtime as needed for allergies.     ibuprofen (ADVIL) 800 MG tablet Take 1 tablet (800 mg total) by mouth every 8 (eight) hours as needed for moderate pain. For AFTER surgery only 30 tablet 0   Ibuprofen-diphenhydrAMINE HCl (IBUPROFEN PM) 200-25 MG CAPS Take 1 tablet by mouth at bedtime as needed (sleep).     ipratropium (ATROVENT) 0.03 % nasal spray Place 2 sprays into both nostrils  every 12 (twelve) hours. 30 mL 0   lidocaine-prilocaine (EMLA) cream Apply to affected area once 30 g 3   Multiple Vitamin (MULTIVITAMIN) tablet Take 1 tablet by mouth daily.     ondansetron (ZOFRAN) 8 MG tablet Take 1 tablet (8 mg total) by mouth 2 (two) times daily as needed for refractory nausea / vomiting. Start on day 3 after chemotherapy. 30 tablet 1   oxyCODONE (OXY IR/ROXICODONE) 5 MG immediate release tablet Take 1 tablet (5 mg total) by mouth every 4 (four) hours as needed for severe pain. For severe pain only, do not take and drive 10 tablet 0   prochlorperazine (COMPAZINE) 10 MG tablet Take 1 tablet (10 mg total) by mouth every 6 (six) hours as needed (Nausea or vomiting). 30 tablet 1   Psyllium (METAMUCIL PO) Take 1 Dose by mouth daily.     senna-docusate (SENOKOT-S) 8.6-50 MG tablet Take 2 tablets by mouth at bedtime. Do not take if having diarrhea (Patient taking differently: Take 2 tablets by mouth daily. Do not take if having diarrhea) 60 tablet 0   Tetrahydrozoline HCl (VISINE OP) Place 1 drop into both eyes daily as needed (itching Daralene Milch).     traMADol (ULTRAM) 50 MG tablet Take 1 tablet (50 mg total) by mouth every 6 (six) hours as needed for severe pain. For AFTER surgery, do not take and drive 10 tablet 0   vitamin B-12 (CYANOCOBALAMIN) 1000 MCG tablet Take 1,000 mcg by mouth daily.     vitamin C (ASCORBIC ACID) 500 MG tablet Take 500 mg by mouth daily.     Vitamin D, Ergocalciferol, (DRISDOL) 1.25 MG (50000 UT) CAPS capsule Take one tablet wkly 12 capsule 3   vitamin E 180 MG (400 UNITS) capsule Take 400 Units by mouth daily.     No current facility-administered medications for this visit.    PHYSICAL EXAMINATION: ECOG PERFORMANCE STATUS: 1 - Symptomatic but completely ambulatory  Vitals:   09/30/20 0806  BP: 105/64  Pulse: 66  Resp: 17  Temp: 97.8 F (36.6 C)  SpO2: 98%   Filed Weights   09/30/20 0806  Weight: 122 lb 3.2 oz (55.4 kg)    GENERAL:alert, no  distress and comfortable SKIN: skin color, texture, turgor are normal, no rashes or significant lesions EYES: normal, Conjunctiva are pink and non-injected, sclera clear Musculoskeletal:no cyanosis of digits and no clubbing  NEURO: alert & oriented x 3 with fluent speech, no focal motor/sensory deficits  LABORATORY DATA:  I have reviewed the data as listed CBC Latest Ref Rng & Units 09/27/2020 09/20/2020 09/08/2020  WBC 3.4 - 10.8 x10E3/uL 4.8 7.2 9.5  Hemoglobin 11.1 - 15.9 g/dL 13.2 10.7(L) 11.6(L)  Hematocrit 34.0 - 46.6 % 38.5 32.2(L) 34.8(L)  Platelets 150 - 450 x10E3/uL 251 232 244     CMP Latest Ref Rng & Units 09/27/2020 09/20/2020 09/08/2020  Glucose 65 - 99 mg/dL 89 88 204(H)  BUN 6 - 24 mg/dL 12 14  17  Creatinine 0.57 - 1.00 mg/dL 0.57 0.56 0.72  Sodium 134 - 144 mmol/L 140 141 140  Potassium 3.5 - 5.2 mmol/L 4.6 4.1 4.1  Chloride 96 - 106 mmol/L 103 107 104  CO2 20 - 29 mmol/L '21 26 25  ' Calcium 8.7 - 10.2 mg/dL 9.5 9.3 9.3  Total Protein 6.0 - 8.5 g/dL 7.4 6.8 7.3  Total Bilirubin 0.0 - 1.2 mg/dL 0.4 0.4 0.2(L)  Alkaline Phos 44 - 121 IU/L 331(H) 260(H) 245(H)  AST 0 - 40 IU/L 63(H) 31 15  ALT 0 - 32 IU/L 75(H) 41 14      RADIOGRAPHIC STUDIES: I have personally reviewed the radiological images as listed and agreed with the findings in the report. No results found.   ASSESSMENT & PLAN:  Sheryl Pratt is a 55 y.o. female with   1. 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 B/l hip and back pain, secondary to bone mets   Plan -Due to her worsening hip and back pain, she is scheduled for palliative radiation to right hip and lumbar spine, which I agree.  She is scheduled for 3 weeks, starting next Monday -I will hold on intravenous FOLFOX chemotherapy during her radiation -Due to her high systemic tumor burden, I recommend oral Xeloda 1000 mg in the morning, 1500 mg in the evening,  on day of radiation Monday through Friday, if I can get it filled quickly -Alternate option would be shorten palliative radiation to 2 weeks, without concurrent IV or oral chemo, I will reach out to Dr. Lisbeth Renshaw -plan to see her back in 2 weeks during her radiation    No orders of the defined types were placed in this encounter.  All questions were answered. The patient knows to call the clinic with any problems, questions or concerns. No barriers to learning was detected. I spent 20 minutes counseling the patient face to face. The total time spent in the appointment was 30 minutes and more than 50% was on counseling and review of test results     [name], MD 09/30/20

## 2020-09-30 NOTE — Telephone Encounter (Signed)
Oral Oncology Pharmacist Encounter  Received new prescription for Xeloda (capecitabine) for the treatment of metastatic gastric cancer in conjunction with radiation, planned for duration of radiation.  Prescription dose and frequency assessed for appropriateness. Appropriate for therapy initiation.   CMP and CBC from 09/27/20 assessed, noted elevation in LFTs (AST 63 IU/L, ALT 75 IU/L) and alk phos 331 IU/L - no hepatic dose adjustments required.  Current medication list in Epic reviewed, no relevant/significant DDIs with Xeloda identified.  Evaluated chart and no patient barriers to medication adherence noted.   Patient agreement for treatment documented in MD note on 09/30/20.  PA is not required for Xeloda per patient's insurance. Xeloda is required to be filled through [employer]   Oral Oncology Clinic will continue to follow for copayment issues, initial counseling and start date.  Leron Croak, PharmD, BCPS Hematology/Oncology Clinical Pharmacist [facility] [phone] 09/30/2020 9:44 AM

## 2020-10-03 ENCOUNTER — Telehealth: Payer: Self-pay | Admitting: Hematology

## 2020-10-03 NOTE — Telephone Encounter (Signed)
Oral Chemotherapy Pharmacist Encounter   Attempted to reach patient to provide update and offer for initial counseling on oral medication: Xeloda (capecitabine).   No answer. Left voicemail for patient to call back to discuss details of medication acquisition and initial counseling session.  Leron Croak, PharmD, BCPS Hematology/Oncology Clinical Pharmacist [facility] [phone] 10/03/2020 9:54 AM

## 2020-10-03 NOTE — Progress Notes (Signed)
Patient calls needing clarification on her appointments and Xeloda.  She has received the medication.  She is scheduled to start RT tomorrow.  I have instructed her to start taking the Xeloda tomorrow and she will take every day she has radiation treatment.  She will not be received IV chemotherapy during this time period.  I have also encouraged the patient to call if she has questions or concerns after starting Xeloda.  She verbalized an understanding.

## 2020-10-03 NOTE — Telephone Encounter (Signed)
Scheduled appointment per 06/13 sch msg. Left a detailed message.

## 2020-10-03 NOTE — Telephone Encounter (Signed)
Oral Chemotherapy Pharmacist Encounter  I spoke with patient for overview of: Xeloda for the treatment of metastatic gastric cancer in conjunction with radiation, planned for duration of radiation.   Counseled patient on administration, dosing, side effects, monitoring, drug-food interactions, safe handling, storage, and disposal.  Patient will take Xeloda 500mg  tablets, 2 tablets (1000mg ) by mouth in AM and 3 tabs (1500mg ) by mouth in PM, within 30 minutes of finishing meals, on days of radiation only.  Xeloda and radiation start date: 10/04/20  Adverse effects of Xeloda include but are not limited to: fatigue, decreased blood counts, GI upset, diarrhea, mouth sores, and hand-foot syndrome.  Patient has anti-emetic on hand and knows to take it if nausea develops.   Patient will obtain anti diarrheal and alert the office of 4 or more loose stools above baseline.   Reviewed with patient importance of keeping a medication schedule and plan for any missed doses. No barriers to medication adherence identified.  Medication reconciliation performed and medication/allergy list updated.  Insurance authorization for Xeloda has been obtained. Test claim at the pharmacy revealed copayment $0 for 1st fill of Xeloda. This was delivered from [place] to patients home on 10/01/20.  All questions answered.  Ms. Macfarlane voiced understanding and appreciation.   Medication education handout placed in mail for patient. Patient knows to call the office with questions or concerns. Oral Chemotherapy Clinic phone number provided to patient.   Leron Croak, PharmD, BCPS Hematology/Oncology Clinical Pharmacist [facility] [phone] 10/03/2020 12:09 PM

## 2020-10-04 ENCOUNTER — Ambulatory Visit
Admission: RE | Admit: 2020-10-04 | Discharge: 2020-10-04 | Disposition: A | Discharge status: Home / Self Care | Payer: 59 | Source: Ambulatory Visit | Attending: Radiation Oncology | Admitting: Radiation Oncology

## 2020-10-04 ENCOUNTER — Other Ambulatory Visit: Payer: Self-pay

## 2020-10-04 DIAGNOSIS — Z510 Encounter for antineoplastic radiation therapy: Secondary | ICD-10-CM | POA: Diagnosis not present

## 2020-10-05 ENCOUNTER — Other Ambulatory Visit: Payer: 59

## 2020-10-05 ENCOUNTER — Ambulatory Visit: Payer: 59 | Admitting: Hematology

## 2020-10-05 ENCOUNTER — Ambulatory Visit
Admission: RE | Admit: 2020-10-05 | Discharge: 2020-10-05 | Disposition: A | Discharge status: Home / Self Care | Payer: 59 | Source: Ambulatory Visit | Attending: Radiation Oncology | Admitting: Radiation Oncology

## 2020-10-05 ENCOUNTER — Other Ambulatory Visit: Payer: Self-pay

## 2020-10-05 DIAGNOSIS — Z510 Encounter for antineoplastic radiation therapy: Secondary | ICD-10-CM | POA: Diagnosis not present

## 2020-10-06 ENCOUNTER — Ambulatory Visit
Admission: RE | Admit: 2020-10-06 | Discharge: 2020-10-06 | Disposition: A | Discharge status: Home / Self Care | Payer: 59 | Source: Ambulatory Visit | Attending: Radiation Oncology | Admitting: Radiation Oncology

## 2020-10-06 ENCOUNTER — Other Ambulatory Visit: Payer: Self-pay

## 2020-10-06 ENCOUNTER — Ambulatory Visit: Payer: 59

## 2020-10-06 DIAGNOSIS — Z510 Encounter for antineoplastic radiation therapy: Secondary | ICD-10-CM | POA: Diagnosis not present

## 2020-10-07 ENCOUNTER — Ambulatory Visit
Admission: RE | Admit: 2020-10-07 | Discharge: 2020-10-07 | Disposition: A | Discharge status: Home / Self Care | Payer: 59 | Source: Ambulatory Visit | Attending: Radiation Oncology | Admitting: Radiation Oncology

## 2020-10-07 DIAGNOSIS — Z510 Encounter for antineoplastic radiation therapy: Secondary | ICD-10-CM | POA: Diagnosis not present

## 2020-10-10 ENCOUNTER — Other Ambulatory Visit: Payer: Self-pay

## 2020-10-10 ENCOUNTER — Ambulatory Visit
Admission: RE | Admit: 2020-10-10 | Discharge: 2020-10-10 | Disposition: A | Discharge status: Home / Self Care | Payer: 59 | Source: Ambulatory Visit | Attending: Radiation Oncology | Admitting: Radiation Oncology

## 2020-10-10 DIAGNOSIS — Z510 Encounter for antineoplastic radiation therapy: Secondary | ICD-10-CM | POA: Diagnosis not present

## 2020-10-11 ENCOUNTER — Other Ambulatory Visit: Payer: Self-pay

## 2020-10-11 ENCOUNTER — Ambulatory Visit
Admission: RE | Admit: 2020-10-11 | Discharge: 2020-10-11 | Disposition: A | Discharge status: Home / Self Care | Payer: 59 | Source: Ambulatory Visit | Attending: Radiation Oncology | Admitting: Radiation Oncology

## 2020-10-11 DIAGNOSIS — Z510 Encounter for antineoplastic radiation therapy: Secondary | ICD-10-CM | POA: Diagnosis not present

## 2020-10-12 ENCOUNTER — Other Ambulatory Visit: Payer: Self-pay

## 2020-10-12 ENCOUNTER — Ambulatory Visit
Admission: RE | Admit: 2020-10-12 | Discharge: 2020-10-12 | Disposition: A | Discharge status: Home / Self Care | Payer: 59 | Source: Ambulatory Visit | Attending: Radiation Oncology | Admitting: Radiation Oncology

## 2020-10-12 DIAGNOSIS — Z510 Encounter for antineoplastic radiation therapy: Secondary | ICD-10-CM | POA: Diagnosis not present

## 2020-10-13 ENCOUNTER — Encounter: Payer: Self-pay | Admitting: Hematology

## 2020-10-13 ENCOUNTER — Inpatient Hospital Stay (HOSPITAL_BASED_OUTPATIENT_CLINIC_OR_DEPARTMENT_OTHER): Payer: 59 | Admitting: Hematology

## 2020-10-13 ENCOUNTER — Inpatient Hospital Stay: Payer: 59

## 2020-10-13 ENCOUNTER — Ambulatory Visit
Admission: RE | Admit: 2020-10-13 | Discharge: 2020-10-13 | Disposition: A | Discharge status: Home / Self Care | Payer: 59 | Source: Ambulatory Visit | Attending: Radiation Oncology | Admitting: Radiation Oncology

## 2020-10-13 VITALS — BP 90/59 | HR 69 | Temp 97.5°F | Resp 18 | Wt 124.9 lb

## 2020-10-13 DIAGNOSIS — C7961 Secondary malignant neoplasm of right ovary: Secondary | ICD-10-CM

## 2020-10-13 DIAGNOSIS — Z510 Encounter for antineoplastic radiation therapy: Secondary | ICD-10-CM | POA: Diagnosis not present

## 2020-10-13 DIAGNOSIS — Z5111 Encounter for antineoplastic chemotherapy: Secondary | ICD-10-CM | POA: Diagnosis not present

## 2020-10-13 DIAGNOSIS — Z95828 Presence of other vascular implants and grafts: Secondary | ICD-10-CM

## 2020-10-13 DIAGNOSIS — C169 Malignant neoplasm of stomach, unspecified: Secondary | ICD-10-CM

## 2020-10-13 LAB — CMP (CANCER CENTER ONLY)
ALT: 73 U/L — ABNORMAL HIGH (ref 0–44)
AST: 45 U/L — ABNORMAL HIGH (ref 15–41)
Albumin: 3.7 g/dL (ref 3.5–5.0)
Alkaline Phosphatase: 278 U/L — ABNORMAL HIGH (ref 38–126)
Anion gap: 9 (ref 5–15)
BUN: 14 mg/dL (ref 6–20)
CO2: 26 mmol/L (ref 22–32)
Calcium: 8.9 mg/dL (ref 8.9–10.3)
Chloride: 105 mmol/L (ref 98–111)
Creatinine: 0.63 mg/dL (ref 0.44–1.00)
GFR, Estimated: >60 mL/min (ref >60)
Glucose, Bld: 108 mg/dL — ABNORMAL HIGH (ref 70–99)
Potassium: 4.1 mmol/L (ref 3.5–5.1)
Sodium: 140 mmol/L (ref 135–145)
Total Bilirubin: 0.4 mg/dL (ref 0.3–1.2)
Total Protein: 6.9 g/dL (ref 6.5–8.1)

## 2020-10-13 LAB — CBC WITH DIFFERENTIAL (CANCER CENTER ONLY)
Abs Immature Granulocytes: 0.01 10*3/uL (ref 0.00–0.07)
Basophils Absolute: 0 10*3/uL (ref 0.0–0.1)
Basophils Relative: 1 %
Eosinophils Absolute: 0.1 10*3/uL (ref 0.0–0.5)
Eosinophils Relative: 1 %
HCT: 33.6 % — ABNORMAL LOW (ref 36.0–46.0)
Hemoglobin: 11.3 g/dL — ABNORMAL LOW (ref 12.0–15.0)
Immature Granulocytes: 0 %
Lymphocytes Relative: 35 %
Lymphs Abs: 1.3 10*3/uL (ref 0.7–4.0)
MCH: 31.9 pg (ref 26.0–34.0)
MCHC: 33.6 g/dL (ref 30.0–36.0)
MCV: 94.9 fL (ref 80.0–100.0)
Monocytes Absolute: 0.5 10*3/uL (ref 0.1–1.0)
Monocytes Relative: 12 %
Neutro Abs: 2 10*3/uL (ref 1.7–7.7)
Neutrophils Relative %: 51 %
Platelet Count: 211 10*3/uL (ref 150–400)
RBC: 3.54 MIL/uL — ABNORMAL LOW (ref 3.87–5.11)
RDW: 15.1 % (ref 11.5–15.5)
WBC Count: 3.8 10*3/uL — ABNORMAL LOW (ref 4.0–10.5)
nRBC: 0 % (ref 0.0–0.2)

## 2020-10-13 MED ORDER — HEPARIN SOD (PORK) LOCK FLUSH 100 UNIT/ML IV SOLN
500.0000 [IU] | Freq: Once | INTRAVENOUS | Status: AC
  Administered 2020-10-13: 500 [IU]
  Filled 2020-10-13: qty 5

## 2020-10-13 MED ORDER — SODIUM CHLORIDE 0.9% FLUSH
10.0000 mL | Freq: Once | INTRAVENOUS | Status: AC
  Administered 2020-10-13: 10 mL
  Filled 2020-10-13: qty 10

## 2020-10-13 NOTE — Progress Notes (Signed)
[facility]   [phone] [phone]   Clinic Follow up Note   Patient Care Team: Lorrene Reid, PA-C as PCP - General Christene Lye, MD (General Surgery) [facility], [place] [name], MD as Consulting Physician (Hematology and Oncology) [name], RN as Oncology Nurse Navigator [name], MD as Consulting Physician (Gynecologic Oncology)  Date of Service:  10/13/2020  CHIEF COMPLAINT: f/u of metastatic gastric adenocarcinoma  SUMMARY OF ONCOLOGIC HISTORY: Oncology History Overview Note  Cancer Staging Gastric adenocarcinoma [facility]) Staging form: Stomach, AJCC 8th Edition - Clinical stage from 08/26/2020: Stage IVB (cTX, cN0, pM1) - Signed by [name], MD on 09/06/2020 Stage prefix: Initial diagnosis Total positive nodes: 0 Histologic grade (G): G3 Histologic grading system: 3 grade system    Gastric adenocarcinoma ([place])  07/30/2020 Imaging   CT Abdomen and Pelvis with Contrast  IMPRESSION: 1. Large hypervascular soft tissue mass occupies the pelvis, compressing the urinary bladder and GI structures. No definite connection of this mass is seen to the vaginal cuff. The ovaries are not identified. 2. Enlarged left adrenal gland. Given the presence of soft tissue mass in the pelvis, this may represent metastatic disease to the adrenal gland. 3. Further evaluation with MRI of the abdomen and pelvis may be considered. Surgical consultation is also advised. 4. Right lower pole nonobstructive nephrolithiasis. 5. Aortic atherosclerosis.     08/09/2020 Procedure   Exploratory laparotomy with right salpingo-oophorectomy, omentectomy washings, peritoneal biopsies, and omentectomy under the care of Dr. Berline Lopes    08/09/2020 Pathology Results   Clinical History: Pelvic mass (crm)      FINAL MICROSCOPIC DIAGNOSIS:   A. ADNEXAL MASS, RIGHT, SALPINGO OOPHORECTOMY:  - Adenocarcinoma with signet ring cell  features.  - No ovarian surface involvement identified.  - Benign Fallopian tube.  - See comment.   The right adnexal mass is 17.8 cm in greatest dimension and is diffusely  involved by adenocarcinoma with diffuse signet ring cell features.  Immunohistochemistry shows positivity with cytokeratin 7 and cytokeratin  20.  The tumor is negative with estrogen receptor, GATA3, GCDFP, CD56,  chromogranin, synaptic ficin, calretinin, inhibin, Napsin A, TTF-1, WT1  and PAX 8.  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  Possible primary sites  include upper gastrointestinal tract and pancreas.   08/19/2020 Initial Diagnosis   Adenocarcinoma ([place])   08/26/2020 Procedure   Upper Endoscopy by Dr Ardis Hughs  IMPRESSION - There were no overtly neoplastic findings. - There was mild, non-specific gastritis and bulbar duodenitis. Both sites were biopsied given large pelvic mass pathology.   08/26/2020 Pathology Results   Surgical [P], gastric antrum and gastric body bx - POORLY DIFFERENTIATED ADENOCARCINOMA WITH SIGNET RING FEATURES. - SEE MICROSCOPIC DESCRIPTION.    08/26/2020 Cancer Staging   Staging form: Stomach, AJCC 8th Edition - Clinical stage from 08/26/2020: Stage IVB (cTX, cN0, pM1) - Signed by [name], MD on 09/06/2020  Stage prefix: Initial diagnosis  Total positive nodes: 0  Histologic grade (G): G3  Histologic grading system: 3 grade system    08/29/2020 PET scan   IMPRESSION: 1. Dominant finding is multifocal hypermetabolic skeletal metastasis. Lesions are subtly evident on CT with subtle sclerosis. Lesions include the pelvis, spine and ribs. 2. Hypermetabolic metastasis in the RIGHT femoral neck with sclerosis may be at risk for pathologic fracture. 3. Resection of large pelvic mass. No residual carcinoma evident within the pelvis. 4. Benign LEFT adrenal  adenoma. 5. Diffuse mild metabolic activity in the gastric fundus  and proximal body is nonspecific. Recommend correlation with recent upper endoscopy.     09/08/2020 -  Chemotherapy   First-line FOLFOX q2weeks starting 09/08/20. Held since 09/30/20 to proceed with palliative radiation     09/26/2020 Genetic Testing   Negative hereditary cancer genetic testing: no pathogenic variants detected in [facility] CustomNext-Cancer +RNAinsight Panel.  Variants of uncertain significance detected in LZTR1 at  p.K452E (c.1354A>G) and MLH1 at p.P747S (c.2239C>T).  The report date is September 21, 2020.   The CustomNext-Cancer +RNAinsight Panel offered by [facility] and includes sequencing and rearrangement analysis for the following 91 genes: AIP, ALK, APC, ATM, AXIN2, BAP1, BARD1, BLM, BMPR1A, BRCA1, BRCA2, BRIP1, CDC73, CDH1, CDK4, CDKN1B, CDKN2A, CHEK2, CTNNA1, DICER1, FANCC, FH, FLCN, GALNT12, KIF1B, LZTR1, MAX, MEN1, MET, MLH1, MRE11A, MSH2, MSH3, MSH6, MUTYH, NBN, NF1, NF2, NTHL1, PALB2, PHOX2B, PMS2, POT1, PRKAR1A, PTCH1, PTEN, RAD50, RAD51C, RAD51D, RB1, RECQL, RET, SDHA, SDHAF2, SDHB, SDHC, SDHD, SMAD4, SMARCA4, SMARCB1, SMARCE1, STK11, SUFU, TMEM127, TP53, TSC1, TSC2, VHL and XRCC2 (sequencing and deletion/duplication); CASR, CFTR, CPA1, CTRC, EGFR, EGLN1, FAM175A, HOXB13, KIT, MITF, MLH3, PALLD, PDGFRA, POLD1, POLE, PRSS1, RINT1, RPS20, SPINK1 and TERT (sequencing only); EPCAM and GREM1 (deletion/duplication only). RNA data is routinely analyzed for use in variant interpretation for all genes.    Radiation Therapy   PENDING Palliative Radiation to right hip/lumbar spine with Dr Lisbeth Renshaw       CURRENT THERAPY:  First line chemo FOLFOX every 2 weeks, on hold during radiation Palliative radiation to right hip and left pelvis through 10/26/20 Xeloda starting 09/30/20  INTERVAL HISTORY:  Sheryl Pratt is here for a follow up of metastatic gastric adenocarcinoma. She was last seen by me on 09/30/20. She presents to the clinic alone. She notes she is tolerating radiation well. She  reports continued intermittent pain, with maybe some improvement.  She notes some mild nausea, which she is unsure if it was related to the Xeloda. This resolved with compazine.  All other systems were reviewed with the patient and are negative.  MEDICAL HISTORY:  Past Medical History:  Diagnosis Date   Allergy    Arthritis    Bloating    Breast mass, right    Cancer ([place])    metastaic to bones and abd, May 2022   Constipation    Family history of gastric cancer 09/08/2020   Family history of ovarian cancer 09/08/2020   Family history of pancreatic cancer 09/08/2020   Family history of skin cancer 09/08/2020   Migraine    Pelvic mass    PONV (postoperative nausea and vomiting)    Seasonal allergies    Vitamin D deficiency     SURGICAL HISTORY: Past Surgical History:  Procedure Laterality Date   APPENDECTOMY     AUGMENTATION MAMMAPLASTY Bilateral    BREAST BIOPSY Right 2014   fibroadenoma   BREAST SURGERY  18 years ago   augmentation.   IR IMAGING GUIDED PORT INSERTION  09/06/2020   LAPAROTOMY  08/09/2020   Procedure: EXPLORATORY LAPAROTOMY, MASS EXCISION, PERITONEAL BIOPSIES;  Surgeon: [name], MD;  Location: [facility];  Service: Gynecology;;   OMENTECTOMY N/A 08/09/2020   Procedure: OMENTECTOMY;  Surgeon: [name], MD;  Location: [facility];  Service: Gynecology;  Laterality: N/A;   SALPINGOOPHORECTOMY Right 08/09/2020   Procedure: OPEN RIGHT SALPINGO OOPHORECTOMY;  Surgeon: [name], MD;  Location: [facility];  Service: Gynecology;  Laterality: Right;   VAGINAL  HYSTERECTOMY  2015    I have reviewed the social history and family history with the patient and they are unchanged from previous note.  ALLERGIES:  is allergic to codeine and wellbutrin [bupropion].  MEDICATIONS:  Current Outpatient Medications  Medication Sig Dispense Refill   acetaminophen (TYLENOL) 325 MG tablet Take by mouth every 6 (six) hours as needed for mild pain.     capecitabine  (XELODA) 500 MG tablet Take 2 tabs in morning and 3 tabs in evening on days of radiation, Monday through Friday. Take within 30 minutes after meals. 75 tablet 1   cholecalciferol (VITAMIN D3) 25 MCG (1000 UNIT) tablet Take 1,000 Units by mouth daily.     diphenhydrAMINE (BENADRYL) 25 mg capsule Take 25 mg by mouth at bedtime as needed for allergies.     ibuprofen (ADVIL) 800 MG tablet Take 1 tablet (800 mg total) by mouth every 8 (eight) hours as needed for moderate pain. For AFTER surgery only 30 tablet 0   Ibuprofen-diphenhydrAMINE HCl (IBUPROFEN PM) 200-25 MG CAPS Take 1 tablet by mouth at bedtime as needed (sleep).     ipratropium (ATROVENT) 0.03 % nasal spray Place 2 sprays into both nostrils every 12 (twelve) hours. 30 mL 0   lidocaine-prilocaine (EMLA) cream Apply to affected area once 30 g 3   Multiple Vitamin (MULTIVITAMIN) tablet Take 1 tablet by mouth daily.     ondansetron (ZOFRAN) 8 MG tablet Take 1 tablet (8 mg total) by mouth 2 (two) times daily as needed for refractory nausea / vomiting. Start on day 3 after chemotherapy. 30 tablet 1   oxyCODONE (OXY IR/ROXICODONE) 5 MG immediate release tablet Take 1 tablet (5 mg total) by mouth every 4 (four) hours as needed for severe pain. For severe pain only, do not take and drive 20 tablet 0   prochlorperazine (COMPAZINE) 10 MG tablet Take 1 tablet (10 mg total) by mouth every 6 (six) hours as needed (Nausea or vomiting). 30 tablet 1   Psyllium (METAMUCIL PO) Take 1 Dose by mouth daily.     senna-docusate (SENOKOT-S) 8.6-50 MG tablet Take 2 tablets by mouth at bedtime. Do not take if having diarrhea (Patient taking differently: Take 2 tablets by mouth daily. Do not take if having diarrhea) 60 tablet 0   Tetrahydrozoline HCl (VISINE OP) Place 1 drop into both eyes daily as needed (itching Daralene Milch).     traMADol (ULTRAM) 50 MG tablet Take 1 tablet (50 mg total) by mouth every 6 (six) hours as needed for severe pain. For AFTER surgery, do not take  and drive 10 tablet 0   vitamin B-12 (CYANOCOBALAMIN) 1000 MCG tablet Take 1,000 mcg by mouth daily.     vitamin C (ASCORBIC ACID) 500 MG tablet Take 500 mg by mouth daily.     Vitamin D, Ergocalciferol, (DRISDOL) 1.25 MG (50000 UT) CAPS capsule Take one tablet wkly 12 capsule 3   vitamin E 180 MG (400 UNITS) capsule Take 400 Units by mouth daily.     No current facility-administered medications for this visit.    PHYSICAL EXAMINATION: ECOG PERFORMANCE STATUS: 1 - Symptomatic but completely ambulatory  Vitals:   10/13/20 1403  BP: (!) 90/59  Pulse: 69  Resp: 18  Temp: (!) 97.5 F (36.4 C)  SpO2: 97%   Filed Weights   10/13/20 1403  Weight: 124 lb 14.4 oz (56.7 kg)    Due to COVID19 we will limit examination to appearance. Patient had no complaints.  GENERAL:alert, no distress  and comfortable SKIN: skin color normal, no rashes or significant lesions EYES: normal, Conjunctiva are pink and non-injected, sclera clear  NEURO: alert & oriented x 3 with fluent speech  LABORATORY DATA:  I have reviewed the data as listed CBC Latest Ref Rng & Units 10/13/2020 09/27/2020 09/20/2020  WBC 4.0 - 10.5 K/uL 3.8(L) 4.8 7.2  Hemoglobin 12.0 - 15.0 g/dL 11.3(L) 13.2 10.7(L)  Hematocrit 36.0 - 46.0 % 33.6(L) 38.5 32.2(L)  Platelets 150 - 400 K/uL 211 251 232     CMP Latest Ref Rng & Units 10/13/2020 09/27/2020 09/20/2020  Glucose 70 - 99 mg/dL 108(H) 89 88  BUN 6 - 20 mg/dL '14 12 14  ' Creatinine 0.44 - 1.00 mg/dL 0.63 0.57 0.56  Sodium 135 - 145 mmol/L 140 140 141  Potassium 3.5 - 5.1 mmol/L 4.1 4.6 4.1  Chloride 98 - 111 mmol/L 105 103 107  CO2 22 - 32 mmol/L '26 21 26  ' Calcium 8.9 - 10.3 mg/dL 8.9 9.5 9.3  Total Protein 6.5 - 8.1 g/dL 6.9 7.4 6.8  Total Bilirubin 0.3 - 1.2 mg/dL 0.4 0.4 0.4  Alkaline Phos 38 - 126 U/L 278(H) 331(H) 260(H)  AST 15 - 41 U/L 45(H) 63(H) 31  ALT 0 - 44 U/L 73(H) 75(H) 41      RADIOGRAPHIC STUDIES: I have personally reviewed the radiological images as  listed and agreed with the findings in the report. No results found.   ASSESSMENT & PLAN:  LULANI BOUR is a 55 y.o. female with   1. 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 Enlarged left adrenal gland.    -On 08/09/20, she underwent diagnostic laparoscopy with right salpingo-oophorectomy, omentectomy washings, peritoneal biopsies, and omentectomy to assure the origin of the mass. The large ovarian mass showed adenocarcinoma with signet ring cell features, IHC studies liver upper GI primary. Biopsy of other pelvic tissue was negative for metastasis. -She underwent EGD with Dr. Ardis Hughs on 08/26/20. Final pathology showed poorly differentiated adenofibroma with signet rings features. This is the same as her ovarian pathology.  -Her 08/29/20 PET showed multifocal hypermetabolic skeletal metastasis, Hypermetabolic metastasis in the RIGHT femoral neck with sclerosis may be at risk for pathologic fracture, no other primary or metastatic disease. I reviewed the scan images with pt today  -I discussed with stage IV cancer, her cancer is no longer curable but still treatable to control her disease and prolong her life.  -Foundation One results showed MSI stable disease with low mutation rate, no targetable mutation.  Her PD-L1 was negative, so she is not a candidate for immunotherapy as first-line. -I started her on first-line chemo with FOLFOX every 2 weeks on 09/08/20. Goal of therapy is palliative. Plan to scan her after 3 months treatment.  -FOLFOX is on hold while she receives radiation therapy. She started oral Xeloda-- 1000 mg in the morning, 1500 mg in the evening, on day of radiation Monday through Friday-- on 09/30/20. She is tolerating well  -We discussed restarting FOLFOX after radiation completion on 10/26/20.  Plan for C3 FOLFOX on 11/03/20. -f/u on 7/14    2. Lower back pain secondary to bone mets, muscular/nerve pains -The patient will continue to take NSAIDs for pain control. If needed, she has a prescription for tramadol and oxycodone. -She is currently receiving palliative radiation therapy to the right hip  and left pelvis for pain, scheduled through 10/26/20. Her pain is intermittent, and her main complaint is her right hip. -She notes sudden mild pain in "random parts of my body." It resolves on its own quickly. Will monitor.   3. Family History of Cancer  -The patient's brother had pancreatic cancer, diagnosed at age 73. She notes unknown cancer in another family member. -Genetic testing done 09/08/20 was negative, with VUS in MLH1 and LZTR1.     PLAN: -She is tolerating radiation and Xeloda well, will continue.  Complete radiation and xeloda on 10/26/20 -Labs, f/u, and C3 FOLFOX on 11/03/20 -Dietitian referral per her request.    No problem-specific Assessment & Plan notes found for this encounter.   Orders Placed This Encounter  Procedures   Ambulatory Referral to [facility]    Referral Priority:   Routine    Referral Type:   Consultation    Referral Reason:   Specialty Services Required    Requested Specialty:   Oncology    Number of Visits Requested:   1    All questions were answered. The patient knows to call the clinic with any problems, questions or concerns. No barriers to learning was detected. The total time spent in the appointment was 30 minutes.     [name], MD 10/13/2020   I, Wilburn Mylar, am acting as scribe for [name], MD.   I have reviewed the above documentation for accuracy and completeness, and I agree with the above.

## 2020-10-14 ENCOUNTER — Other Ambulatory Visit: Payer: Self-pay

## 2020-10-14 ENCOUNTER — Ambulatory Visit
Admission: RE | Admit: 2020-10-14 | Discharge: 2020-10-14 | Disposition: A | Discharge status: Home / Self Care | Payer: 59 | Source: Ambulatory Visit | Attending: Radiation Oncology | Admitting: Radiation Oncology

## 2020-10-14 DIAGNOSIS — Z510 Encounter for antineoplastic radiation therapy: Secondary | ICD-10-CM | POA: Diagnosis not present

## 2020-10-17 ENCOUNTER — Other Ambulatory Visit: Payer: Self-pay

## 2020-10-17 ENCOUNTER — Ambulatory Visit
Admission: RE | Admit: 2020-10-17 | Discharge: 2020-10-17 | Disposition: A | Discharge status: Home / Self Care | Payer: 59 | Source: Ambulatory Visit | Attending: Radiation Oncology | Admitting: Radiation Oncology

## 2020-10-17 DIAGNOSIS — Z510 Encounter for antineoplastic radiation therapy: Secondary | ICD-10-CM | POA: Diagnosis not present

## 2020-10-18 ENCOUNTER — Other Ambulatory Visit: Payer: Self-pay

## 2020-10-18 ENCOUNTER — Ambulatory Visit
Admission: RE | Admit: 2020-10-18 | Discharge: 2020-10-18 | Disposition: A | Discharge status: Home / Self Care | Payer: 59 | Source: Ambulatory Visit | Attending: Radiation Oncology | Admitting: Radiation Oncology

## 2020-10-18 DIAGNOSIS — Z510 Encounter for antineoplastic radiation therapy: Secondary | ICD-10-CM | POA: Diagnosis not present

## 2020-10-19 ENCOUNTER — Ambulatory Visit
Admission: RE | Admit: 2020-10-19 | Discharge: 2020-10-19 | Disposition: A | Discharge status: Home / Self Care | Payer: 59 | Source: Ambulatory Visit | Attending: Radiation Oncology | Admitting: Radiation Oncology

## 2020-10-19 DIAGNOSIS — Z510 Encounter for antineoplastic radiation therapy: Secondary | ICD-10-CM | POA: Diagnosis not present

## 2020-10-20 ENCOUNTER — Other Ambulatory Visit: Payer: 59

## 2020-10-20 ENCOUNTER — Other Ambulatory Visit: Payer: Self-pay

## 2020-10-20 ENCOUNTER — Ambulatory Visit: Payer: 59 | Admitting: Hematology

## 2020-10-20 ENCOUNTER — Ambulatory Visit: Payer: 59

## 2020-10-20 ENCOUNTER — Ambulatory Visit
Admission: RE | Admit: 2020-10-20 | Discharge: 2020-10-20 | Disposition: A | Discharge status: Home / Self Care | Payer: 59 | Source: Ambulatory Visit | Attending: Radiation Oncology | Admitting: Radiation Oncology

## 2020-10-20 DIAGNOSIS — Z510 Encounter for antineoplastic radiation therapy: Secondary | ICD-10-CM | POA: Diagnosis not present

## 2020-10-21 ENCOUNTER — Ambulatory Visit
Admission: RE | Admit: 2020-10-21 | Discharge: 2020-10-21 | Disposition: A | Discharge status: Home / Self Care | Payer: 59 | Source: Ambulatory Visit | Attending: Radiation Oncology | Admitting: Radiation Oncology

## 2020-10-21 DIAGNOSIS — C169 Malignant neoplasm of stomach, unspecified: Secondary | ICD-10-CM | POA: Diagnosis present

## 2020-10-21 DIAGNOSIS — Z510 Encounter for antineoplastic radiation therapy: Secondary | ICD-10-CM | POA: Insufficient documentation

## 2020-10-21 DIAGNOSIS — C7951 Secondary malignant neoplasm of bone: Secondary | ICD-10-CM | POA: Insufficient documentation

## 2020-10-25 ENCOUNTER — Ambulatory Visit: Payer: 59

## 2020-10-26 ENCOUNTER — Encounter: Payer: Self-pay | Admitting: Radiation Oncology

## 2020-10-26 ENCOUNTER — Ambulatory Visit
Admission: RE | Admit: 2020-10-26 | Discharge: 2020-10-26 | Disposition: A | Discharge status: Home / Self Care | Payer: 59 | Source: Ambulatory Visit | Attending: Radiation Oncology | Admitting: Radiation Oncology

## 2020-10-26 ENCOUNTER — Other Ambulatory Visit: Payer: Self-pay

## 2020-10-26 DIAGNOSIS — Z510 Encounter for antineoplastic radiation therapy: Secondary | ICD-10-CM | POA: Diagnosis not present

## 2020-11-03 ENCOUNTER — Encounter: Payer: Self-pay | Admitting: Hematology

## 2020-11-03 ENCOUNTER — Inpatient Hospital Stay: Payer: 59

## 2020-11-03 ENCOUNTER — Other Ambulatory Visit: Payer: Self-pay

## 2020-11-03 ENCOUNTER — Inpatient Hospital Stay: Payer: 59 | Admitting: Hematology

## 2020-11-03 ENCOUNTER — Encounter: Payer: 59 | Admitting: Nutrition

## 2020-11-03 ENCOUNTER — Inpatient Hospital Stay: Payer: 59 | Attending: Gynecologic Oncology

## 2020-11-03 ENCOUNTER — Ambulatory Visit: Payer: 59 | Admitting: Nutrition

## 2020-11-03 VITALS — BP 93/66 | HR 65 | Temp 98.4°F | Resp 19 | Ht 68.0 in | Wt 127.3 lb

## 2020-11-03 DIAGNOSIS — C7961 Secondary malignant neoplasm of right ovary: Secondary | ICD-10-CM | POA: Diagnosis present

## 2020-11-03 DIAGNOSIS — C801 Malignant (primary) neoplasm, unspecified: Secondary | ICD-10-CM

## 2020-11-03 DIAGNOSIS — Z5111 Encounter for antineoplastic chemotherapy: Secondary | ICD-10-CM | POA: Diagnosis not present

## 2020-11-03 DIAGNOSIS — C169 Malignant neoplasm of stomach, unspecified: Secondary | ICD-10-CM | POA: Diagnosis not present

## 2020-11-03 DIAGNOSIS — Z95828 Presence of other vascular implants and grafts: Secondary | ICD-10-CM

## 2020-11-03 DIAGNOSIS — C7951 Secondary malignant neoplasm of bone: Secondary | ICD-10-CM | POA: Insufficient documentation

## 2020-11-03 LAB — CBC WITH DIFFERENTIAL (CANCER CENTER ONLY)
Abs Immature Granulocytes: 0.01 K/uL (ref 0.00–0.07)
Basophils Absolute: 0 K/uL (ref 0.0–0.1)
Basophils Relative: 1 %
Eosinophils Absolute: 0.1 K/uL (ref 0.0–0.5)
Eosinophils Relative: 2 %
HCT: 35.2 % — ABNORMAL LOW (ref 36.0–46.0)
Hemoglobin: 11.8 g/dL — ABNORMAL LOW (ref 12.0–15.0)
Immature Granulocytes: 0 %
Lymphocytes Relative: 24 %
Lymphs Abs: 0.9 K/uL (ref 0.7–4.0)
MCH: 32.9 pg (ref 26.0–34.0)
MCHC: 33.5 g/dL (ref 30.0–36.0)
MCV: 98.1 fL (ref 80.0–100.0)
Monocytes Absolute: 0.4 K/uL (ref 0.1–1.0)
Monocytes Relative: 11 %
Neutro Abs: 2.3 K/uL (ref 1.7–7.7)
Neutrophils Relative %: 62 %
Platelet Count: 180 K/uL (ref 150–400)
RBC: 3.59 MIL/uL — ABNORMAL LOW (ref 3.87–5.11)
RDW: 18 % — ABNORMAL HIGH (ref 11.5–15.5)
WBC Count: 3.7 K/uL — ABNORMAL LOW (ref 4.0–10.5)
nRBC: 0 % (ref 0.0–0.2)

## 2020-11-03 LAB — CMP (CANCER CENTER ONLY)
ALT: 20 U/L (ref 0–44)
AST: 20 U/L (ref 15–41)
Albumin: 3.7 g/dL (ref 3.5–5.0)
Alkaline Phosphatase: 153 U/L — ABNORMAL HIGH (ref 38–126)
Anion gap: 8 (ref 5–15)
BUN: 12 mg/dL (ref 6–20)
CO2: 25 mmol/L (ref 22–32)
Calcium: 9.2 mg/dL (ref 8.9–10.3)
Chloride: 108 mmol/L (ref 98–111)
Creatinine: 0.59 mg/dL (ref 0.44–1.00)
GFR, Estimated: >60 mL/min
Glucose, Bld: 85 mg/dL (ref 70–99)
Potassium: 4.2 mmol/L (ref 3.5–5.1)
Sodium: 141 mmol/L (ref 135–145)
Total Bilirubin: 0.5 mg/dL (ref 0.3–1.2)
Total Protein: 6.9 g/dL (ref 6.5–8.1)

## 2020-11-03 MED ORDER — SODIUM CHLORIDE 0.9% FLUSH
10.0000 mL | INTRAVENOUS | Status: DC | PRN
  Filled 2020-11-03: qty 10

## 2020-11-03 MED ORDER — PALONOSETRON HCL INJECTION 0.25 MG/5ML
INTRAVENOUS | Status: AC
  Filled 2020-11-03: qty 5

## 2020-11-03 MED ORDER — DEXTROSE 5 % IV SOLN
Freq: Once | INTRAVENOUS | Status: AC
  Filled 2020-11-03: qty 250

## 2020-11-03 MED ORDER — SODIUM CHLORIDE 0.9 % IV SOLN
2400.0000 mg/m2 | INTRAVENOUS | Status: DC
  Administered 2020-11-03: 3800 mg via INTRAVENOUS
  Filled 2020-11-03: qty 76

## 2020-11-03 MED ORDER — PALONOSETRON HCL INJECTION 0.25 MG/5ML
0.2500 mg | Freq: Once | INTRAVENOUS | Status: AC
  Administered 2020-11-03: 0.25 mg via INTRAVENOUS

## 2020-11-03 MED ORDER — OXALIPLATIN CHEMO INJECTION 100 MG/20ML
85.0000 mg/m2 | Freq: Once | INTRAVENOUS | Status: AC
  Administered 2020-11-03: 135 mg via INTRAVENOUS
  Filled 2020-11-03: qty 20

## 2020-11-03 MED ORDER — SODIUM CHLORIDE 0.9 % IV SOLN
10.0000 mg | Freq: Once | INTRAVENOUS | Status: AC
  Administered 2020-11-03: 10 mg via INTRAVENOUS
  Filled 2020-11-03: qty 10

## 2020-11-03 MED ORDER — SODIUM CHLORIDE 0.9% FLUSH
10.0000 mL | Freq: Once | INTRAVENOUS | Status: AC
  Administered 2020-11-03: 10 mL
  Filled 2020-11-03: qty 10

## 2020-11-03 MED ORDER — HEPARIN SOD (PORK) LOCK FLUSH 100 UNIT/ML IV SOLN
500.0000 [IU] | Freq: Once | INTRAVENOUS | Status: DC
  Filled 2020-11-03: qty 5

## 2020-11-03 MED ORDER — LEUCOVORIN CALCIUM INJECTION 350 MG
400.0000 mg/m2 | Freq: Once | INTRAVENOUS | Status: AC
  Administered 2020-11-03: 636 mg via INTRAVENOUS
  Filled 2020-11-03: qty 31.8

## 2020-11-03 NOTE — Patient Instructions (Signed)
[facility]    Discharge Instructions: Thank you for choosing [facility] to provide your oncology and hematology care.   If you have a lab appointment with the [facility], please go directly to the [facility] and check in at the registration area.   Wear comfortable clothing and clothing appropriate for easy access to any Portacath or PICC line.   We strive to give you quality time with your provider. You may need to reschedule your appointment if you arrive late (15 or more minutes).  Arriving late affects you and other patients whose appointments are after yours.  Also, if you miss three or more appointments without notifying the office, you may be dismissed from the clinic at the provider's discretion.      For prescription refill requests, have your pharmacy contact our office and allow 72 hours for refills to be completed.    Today you received the following chemotherapy and/or immunotherapy agents: oxaliplatin, leucovorin, and fluorouracil.      To help prevent nausea and vomiting after your treatment, we encourage you to take your nausea medication as directed.  BELOW ARE SYMPTOMS THAT SHOULD BE REPORTED IMMEDIATELY: *FEVER GREATER THAN 100.4 F (38 C) OR HIGHER *CHILLS OR SWEATING *NAUSEA AND VOMITING THAT IS NOT CONTROLLED WITH YOUR NAUSEA MEDICATION *UNUSUAL SHORTNESS OF BREATH *UNUSUAL BRUISING OR BLEEDING *URINARY PROBLEMS (pain or burning when urinating, or frequent urination) *BOWEL PROBLEMS (unusual diarrhea, constipation, pain near the anus) TENDERNESS IN MOUTH AND THROAT WITH OR WITHOUT PRESENCE OF ULCERS (sore throat, sores in mouth, or a toothache) UNUSUAL RASH, SWELLING OR PAIN  UNUSUAL VAGINAL DISCHARGE OR ITCHING   Items with * indicate a potential emergency and should be followed up as soon as possible or go to the Emergency Department if any problems should occur.  Please show the CHEMOTHERAPY ALERT CARD or  IMMUNOTHERAPY ALERT CARD at check-in to the Emergency Department and triage nurse.  Should you have questions after your visit or need to cancel or reschedule your appointment, please contact [facility]  Dept: [phone]  and follow the prompts.  Office hours are 8:00 a.m. to 4:30 p.m. Monday - Friday. Please note that voicemails left after 4:00 p.m. may not be returned until the following business day.  We are closed weekends and major holidays. You have access to a nurse at all times for urgent questions. Please call the main number to the clinic Dept: [phone] and follow the prompts.   For any non-urgent questions, you may also contact your provider using MyChart. We now offer e-Visits for anyone 46 and older to request care online for non-urgent symptoms. For details visit [email or web address].   Also download the MyChart app! Go to the app store, search "MyChart", open the app, select [facility], and log in with your MyChart username and password.  Due to Covid, a mask is required upon entering the hospital/clinic. If you do not have a mask, one will be given to you upon arrival. For doctor visits, patients may have 1 support person aged 64 or older with them. For treatment visits, patients cannot have anyone with them due to current Covid guidelines and our immunocompromised population.   [facility] Discharge Instructions for Patients receiving Home Portable Chemo Pump    **The bag should finish at 46 hours. For example, if your pump is scheduled for 46 hours and it was put on at 4pm, it should finish at  2 pm the day it is scheduled to come off regardless of your appointment time.    Estimated time to finish   ____________11:30_____________ (Have your nurse fill in)     ** if the display on your pump reads "Low Volume" and it is beeping, take the batteries out of the pump and come to the cancer center for it to be taken off.   **If  the pump alarms go off prior to the pump reading "Low Volume" then call the [phone] and someone can assist you.  **If the plunger comes out and the bag fluid is running out, please use your chemo spill kit to clean up the spill. Do not use paper towels or other house hold products.  ** If you have problems or questions regarding your pump, please call either the [phone] or the cancer center Monday-Friday 8:00am-4:30pm at [phone] and we will assist you.  If you are unable to get assistance then go to [facility] Emergency Room, ask the staff to contact the IV team for assistance.

## 2020-11-03 NOTE — Progress Notes (Signed)
Patient requested nutrition appointment to learn about protein needs.   55 year old female diagnosed with metastatic gastric cancer and followed by Dr. Burr Medico.   She is being treated with FOLFOX.  Past medical history includes constipation and pelvic mass.  Medications include vitamin D, Zofran, Compazine, Metamucil, Senokot-S, vitamin B12, vitamin C, and vitamin D.  Labs were reviewed.  Height: 68 inches. Weight: Improved to 127.3 pounds July 14. Usual body weight: 125-130 pounds per patient. BMI: 19.36. ECOG: 1.  Estimated nutrition needs: 1650-1850 cal, 70-85 g protein, 1.8 L fluid.  Patient reports she used to be a vegan.  She began eating protein from animal foods because she was told by some people (?) that she was not eating enough protein.  Patient would like to resume vegan diet and is looking for more specific information on protein needs.  Nutrition diagnosis: Food and nutrition related knowledge deficit related to metastatic gastric cancer and associated treatments as evidenced by no prior need for specific nutrition information.  Intervention: Educated patient it is possible to consume adequate protein through vegan foods.  Reviewed vegan food choices high in protein.  Recommended patient use an app to help track her protein intake. Recommended range of 70-85 g protein daily. Encouraged patient to consume small, frequent meals and snacks with high-calorie, high-protein foods and aim for weight maintenance. Encouraged patient to consider activity to maintain muscle mass within MD guidelines. Questions were answered.  Teach back method used.  Contact information provided.  Monitoring, evaluation, goals: Patient will tolerate vegan diet with adequate calories and protein to maintain lean body mass and meet greater than 90% protein needs.  Patient will contact RD with questions and concerns.  No follow-up scheduled at this time.  Please consult RD if needed.  **Disclaimer:  This note was dictated with voice recognition software. Similar sounding words can inadvertently be transcribed and this note may contain transcription errors which may not have been corrected upon publication of note.**

## 2020-11-03 NOTE — Patient Instructions (Signed)

## 2020-11-03 NOTE — Progress Notes (Signed)
Sheryl Pratt   [phone] Fax:(336) [phone]   Clinic Follow up Note   Patient Care Team: Lorrene Reid, PA-C as PCP - General Christene Lye, MD (General Surgery) [facility], [place] [name], MD as Consulting Physician (Hematology and Oncology) [name], RN (Inactive) as Oncology Nurse Navigator [name], MD as Consulting Physician (Gynecologic Oncology)  Date of Service:  11/03/2020  CHIEF COMPLAINT: f/u of metastatic gastric adenocarcinoma  SUMMARY OF ONCOLOGIC HISTORY: Oncology History Overview Note  Cancer Staging Gastric adenocarcinoma [facility]) Staging form: Stomach, AJCC 8th Edition - Clinical stage from 08/26/2020: Stage IVB (cTX, cN0, pM1) - Signed by [name], MD on 09/06/2020 Stage prefix: Initial diagnosis Total positive nodes: 0 Histologic grade (G): G3 Histologic grading system: 3 grade system    Gastric adenocarcinoma ([place])  07/30/2020 Imaging   CT Abdomen and Pelvis with Contrast  IMPRESSION: 1. Large hypervascular soft tissue mass occupies the pelvis, compressing the urinary bladder and GI structures. No definite connection of this mass is seen to the vaginal cuff. The ovaries are not identified. 2. Enlarged left adrenal gland. Given the presence of soft tissue mass in the pelvis, this may represent metastatic disease to the adrenal gland. 3. Further evaluation with MRI of the abdomen and pelvis may be considered. Surgical consultation is also advised. 4. Right lower pole nonobstructive nephrolithiasis. 5. Aortic atherosclerosis.     08/09/2020 Procedure   Exploratory laparotomy with right salpingo-oophorectomy, omentectomy washings, peritoneal biopsies, and omentectomy under the care of Dr. Berline Lopes    08/09/2020 Pathology Results   Clinical History: Pelvic mass (crm)      FINAL MICROSCOPIC DIAGNOSIS:   A. ADNEXAL MASS, RIGHT, SALPINGO OOPHORECTOMY:  - Adenocarcinoma with signet ring  cell features.  - No ovarian surface involvement identified.  - Benign Fallopian tube.  - See comment.   The right adnexal mass is 17.8 cm in greatest dimension and is diffusely  involved by adenocarcinoma with diffuse signet ring cell features.  Immunohistochemistry shows positivity with cytokeratin 7 and cytokeratin  20.  The tumor is negative with estrogen receptor, GATA3, GCDFP, CD56,  chromogranin, synaptic ficin, calretinin, inhibin, Napsin A, TTF-1, WT1  and PAX 8.  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  Possible primary sites  include upper gastrointestinal tract and pancreas.   08/19/2020 Initial Diagnosis   Adenocarcinoma ([place])   08/26/2020 Procedure   Upper Endoscopy by Dr Ardis Hughs  IMPRESSION - There were no overtly neoplastic findings. - There was mild, non-specific gastritis and bulbar duodenitis. Both sites were biopsied given large pelvic mass pathology.   08/26/2020 Pathology Results   Surgical [P], gastric antrum and gastric body bx - POORLY DIFFERENTIATED ADENOCARCINOMA WITH SIGNET RING FEATURES. - SEE MICROSCOPIC DESCRIPTION.    08/26/2020 Cancer Staging   Staging form: Stomach, AJCC 8th Edition - Clinical stage from 08/26/2020: Stage IVB (cTX, cN0, pM1) - Signed by [name], MD on 09/06/2020  Stage prefix: Initial diagnosis  Total positive nodes: 0  Histologic grade (G): G3  Histologic grading system: 3 grade system    08/29/2020 PET scan   IMPRESSION: 1. Dominant finding is multifocal hypermetabolic skeletal metastasis. Lesions are subtly evident on CT with subtle sclerosis. Lesions include the pelvis, spine and ribs. 2. Hypermetabolic metastasis in the RIGHT femoral neck with sclerosis may be at risk for pathologic fracture. 3. Resection of large pelvic mass. No residual carcinoma evident within the pelvis. 4. Benign LEFT  adrenal adenoma. 5. Diffuse mild metabolic activity in the gastric  fundus and proximal body is nonspecific. Recommend correlation with recent upper endoscopy.     09/08/2020 -  Chemotherapy   First-line FOLFOX q2weeks starting 09/08/20. Held since 09/30/20 to proceed with palliative radiation     09/26/2020 Genetic Testing   Negative hereditary cancer genetic testing: no pathogenic variants detected in [facility] CustomNext-Cancer +RNAinsight Panel.  Variants of uncertain significance detected in LZTR1 at  p.K452E (c.1354A>G) and MLH1 at p.P747S (c.2239C>T).  The report date is September 21, 2020.   The CustomNext-Cancer +RNAinsight Panel offered by [facility] and includes sequencing and rearrangement analysis for the following 91 genes: AIP, ALK, APC, ATM, AXIN2, BAP1, BARD1, BLM, BMPR1A, BRCA1, BRCA2, BRIP1, CDC73, CDH1, CDK4, CDKN1B, CDKN2A, CHEK2, CTNNA1, DICER1, FANCC, FH, FLCN, GALNT12, KIF1B, LZTR1, MAX, MEN1, MET, MLH1, MRE11A, MSH2, MSH3, MSH6, MUTYH, NBN, NF1, NF2, NTHL1, PALB2, PHOX2B, PMS2, POT1, PRKAR1A, PTCH1, PTEN, RAD50, RAD51C, RAD51D, RB1, RECQL, RET, SDHA, SDHAF2, SDHB, SDHC, SDHD, SMAD4, SMARCA4, SMARCB1, SMARCE1, STK11, SUFU, TMEM127, TP53, TSC1, TSC2, VHL and XRCC2 (sequencing and deletion/duplication); CASR, CFTR, CPA1, CTRC, EGFR, EGLN1, FAM175A, HOXB13, KIT, MITF, MLH3, PALLD, PDGFRA, POLD1, POLE, PRSS1, RINT1, RPS20, SPINK1 and TERT (sequencing only); EPCAM and GREM1 (deletion/duplication only). RNA data is routinely analyzed for use in variant interpretation for all genes.    Radiation Therapy   PENDING Palliative Radiation to right hip/lumbar spine with Dr Lisbeth Renshaw       CURRENT THERAPY:  First line chemo FOLFOX every 2 weeks, on hold during radiation Xeloda starting 09/30/20  INTERVAL HISTORY:  Sheryl Pratt is here for a follow up of metastatic gastric adenocarcinoma. She was last seen by me on 10/13/20. She presents to the clinic alone. She reports she tolerated radiation well. She notes she experienced fatigue as radiation went on, but she  has recovered since the end of treatment. She notes improvement to her back pain with radiation. She is able to control the residual pain with tylenol as needed. She reports some abdominal swelling.   All other systems were reviewed with the patient and are negative.  MEDICAL HISTORY:  Past Medical History:  Diagnosis Date   Allergy    Arthritis    Bloating    Breast mass, right    Cancer ([place])    metastaic to bones and abd, May 2022   Constipation    Family history of gastric cancer 09/08/2020   Family history of ovarian cancer 09/08/2020   Family history of pancreatic cancer 09/08/2020   Family history of skin cancer 09/08/2020   Migraine    Pelvic mass    PONV (postoperative nausea and vomiting)    Seasonal allergies    Vitamin D deficiency     SURGICAL HISTORY: Past Surgical History:  Procedure Laterality Date   APPENDECTOMY     AUGMENTATION MAMMAPLASTY Bilateral    BREAST BIOPSY Right 2014   fibroadenoma   BREAST SURGERY  18 years ago   augmentation.   IR IMAGING GUIDED PORT INSERTION  09/06/2020   LAPAROTOMY  08/09/2020   Procedure: EXPLORATORY LAPAROTOMY, MASS EXCISION, PERITONEAL BIOPSIES;  Surgeon: [name], MD;  Location: [facility];  Service: Gynecology;;   OMENTECTOMY N/A 08/09/2020   Procedure: OMENTECTOMY;  Surgeon: [name], MD;  Location: [facility];  Service: Gynecology;  Laterality: N/A;   SALPINGOOPHORECTOMY Right 08/09/2020   Procedure: OPEN RIGHT SALPINGO OOPHORECTOMY;  Surgeon: [name], MD;  Location: [facility];  Service: Gynecology;  Laterality: Right;   VAGINAL HYSTERECTOMY  2015    I have reviewed the social history and family history with the patient and they are unchanged from previous note.  ALLERGIES:  is allergic to codeine and wellbutrin [bupropion].  MEDICATIONS:  Current Outpatient Medications  Medication Sig Dispense Refill   acetaminophen (TYLENOL) 325 MG tablet Take by mouth every 6 (six) hours as needed for mild  pain.     capecitabine (XELODA) 500 MG tablet Take 2 tabs in morning and 3 tabs in evening on days of radiation, Monday through Friday. Take within 30 minutes after meals. 75 tablet 1   cholecalciferol (VITAMIN D3) 25 MCG (1000 UNIT) tablet Take 1,000 Units by mouth daily.     diphenhydrAMINE (BENADRYL) 25 mg capsule Take 25 mg by mouth at bedtime as needed for allergies.     ibuprofen (ADVIL) 800 MG tablet Take 1 tablet (800 mg total) by mouth every 8 (eight) hours as needed for moderate pain. For AFTER surgery only 30 tablet 0   Ibuprofen-diphenhydrAMINE HCl (IBUPROFEN PM) 200-25 MG CAPS Take 1 tablet by mouth at bedtime as needed (sleep).     ipratropium (ATROVENT) 0.03 % nasal spray Place 2 sprays into both nostrils every 12 (twelve) hours. 30 mL 0   lidocaine-prilocaine (EMLA) cream Apply to affected area once 30 g 3   Multiple Vitamin (MULTIVITAMIN) tablet Take 1 tablet by mouth daily.     ondansetron (ZOFRAN) 8 MG tablet Take 1 tablet (8 mg total) by mouth 2 (two) times daily as needed for refractory nausea / vomiting. Start on day 3 after chemotherapy. 30 tablet 1   oxyCODONE (OXY IR/ROXICODONE) 5 MG immediate release tablet Take 1 tablet (5 mg total) by mouth every 4 (four) hours as needed for severe pain. For severe pain only, do not take and drive 20 tablet 0   prochlorperazine (COMPAZINE) 10 MG tablet Take 1 tablet (10 mg total) by mouth every 6 (six) hours as needed (Nausea or vomiting). 30 tablet 1   Psyllium (METAMUCIL PO) Take 1 Dose by mouth daily.     senna-docusate (SENOKOT-S) 8.6-50 MG tablet Take 2 tablets by mouth at bedtime. Do not take if having diarrhea (Patient taking differently: Take 2 tablets by mouth daily. Do not take if having diarrhea) 60 tablet 0   Tetrahydrozoline HCl (VISINE OP) Place 1 drop into both eyes daily as needed (itching Daralene Milch).     traMADol (ULTRAM) 50 MG tablet Take 1 tablet (50 mg total) by mouth every 6 (six) hours as needed for severe pain. For  AFTER surgery, do not take and drive 10 tablet 0   vitamin B-12 (CYANOCOBALAMIN) 1000 MCG tablet Take 1,000 mcg by mouth daily.     vitamin C (ASCORBIC ACID) 500 MG tablet Take 500 mg by mouth daily.     Vitamin D, Ergocalciferol, (DRISDOL) 1.25 MG (50000 UT) CAPS capsule Take one tablet wkly 12 capsule 3   vitamin E 180 MG (400 UNITS) capsule Take 400 Units by mouth daily.     No current facility-administered medications for this visit.    PHYSICAL EXAMINATION: ECOG PERFORMANCE STATUS: 1 - Symptomatic but completely ambulatory  Vitals:   11/03/20 0916  BP: 93/66  Pulse: 65  Resp: 19  Temp: 98.4 F (36.9 C)  SpO2: 99%   Filed Weights   11/03/20 0916  Weight: 127 lb 4.8 oz (57.7 kg)    GENERAL:alert, no distress and comfortable SKIN: skin color, texture, turgor are normal, no rashes  or significant lesions EYES: normal, Conjunctiva are pink and non-injected, sclera clear  ABDOMEN:abdomen soft, and normal bowel sounds; tenderness, mostly on right side in area of prior surgery Musculoskeletal:no cyanosis of digits and no clubbing  NEURO: alert & oriented x 3 with fluent speech, no focal motor/sensory deficits  LABORATORY DATA:  I have reviewed the data as listed CBC Latest Ref Rng & Units 11/03/2020 10/13/2020 09/27/2020  WBC 4.0 - 10.5 K/uL 3.7(L) 3.8(L) 4.8  Hemoglobin 12.0 - 15.0 g/dL 11.8(L) 11.3(L) 13.2  Hematocrit 36.0 - 46.0 % 35.2(L) 33.6(L) 38.5  Platelets 150 - 400 K/uL 180 211 251     CMP Latest Ref Rng & Units 10/13/2020 09/27/2020 09/20/2020  Glucose 70 - 99 mg/dL 108(H) 89 88  BUN 6 - 20 mg/dL _0 Creatinine 0.44 - 1.00 mg/dL 0.63 0.57 0.56  Sodium 135 - 145 mmol/L 140 140 141  Potassium 3.5 - 5.1 mmol/L 4.1 4.6 4.1  Chloride 98 - 111 mmol/L 105 103 107  CO2 22 - 32 mmol/L _1 Calcium 8.9 - 10.3 mg/dL 8.9 9.5 9.3  Total Protein 6.5 - 8.1 g/dL 6.9 7.4 6.8  Total Bilirubin 0.3 - 1.2 mg/dL 0.4 0.4 0.4  Alkaline Phos 38 - 126 U/L 278(H) 331(H) 260(H)   AST 15 - 41 U/L 45(H) 63(H) 31  ALT 0 - 44 U/L 73(H) 75(H) 41      RADIOGRAPHIC STUDIES: I have personally reviewed the radiological images as listed and agreed with the findings in the report. No results found.   ASSESSMENT & PLAN:  Sheryl Pratt is a 55 y.o. female with   1. 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 Enlarged left adrenal gland.    -On 08/09/20, she underwent diagnostic laparoscopy with right salpingo-oophorectomy, omentectomy washings, peritoneal biopsies, and omentectomy to assure the origin of the mass. The large ovarian mass showed adenocarcinoma with signet ring cell features, IHC studies liver upper GI primary. Biopsy of other pelvic tissue was negative for metastasis. -She underwent EGD with Dr. Ardis Hughs on 08/26/20. Final pathology showed poorly differentiated adenofibroma with signet rings features. This is the same as her ovarian pathology.  -Her 08/29/20 PET showed multifocal hypermetabolic skeletal metastasis, Hypermetabolic metastasis in the RIGHT femoral neck with sclerosis may be at risk for pathologic fracture, no other primary or metastatic disease. I reviewed the scan images with pt today  -I discussed with stage IV cancer, her cancer is no longer curable but still treatable to control her disease and prolong her life.  -[facility] results showed MSI stable disease with low mutation rate, no targetable mutation.  Her PD-L1 was negative, so she is not a candidate for immunotherapy as first-line. -I started her on first-line chemo with FOLFOX every 2 weeks on 09/08/20. Goal of therapy is palliative. Plan to scan her after 3 months treatment.  -she proceed with palliative RT after two cycles chemo. She took oral Xeloda-- 1000 mg in the morning, 1500 mg in the evening, on  day of radiation Monday through Friday-- during treatment 09/30/20 through 10/26/20.  -she tolerated RT well, pain has improved  -She is scheduled to proceed with C3 FOLFOX today. Labs are pending. -plan to repeat staging scan after cycle 6    2. Lower back pain secondary to bone mets, muscular/nerve pains -The patient  will continue to take NSAIDs for pain control. If needed, she has a prescription for tramadol and oxycodone. -She is currently receiving palliative radiation therapy to the right hip and left pelvis for pain through 10/26/20. Her pain has improved.  3. Social Support -She and her husband bought a house in [place], where their children live. They planned to retire there.  -She notes she would prefer to plan vacation trips around her treatment.   4. Family History of Cancer  -The patient's brother had pancreatic cancer, diagnosed at age 39. She notes unknown cancer in another family member. -Genetic testing done 09/08/20 was negative, with VUS in MLH1 and LZTR1.     PLAN: -proceed with C3 FOLFOX today -labs, f/u, and C4 in 2 weeks    No problem-specific Assessment & Plan notes found for this encounter.   No orders of the defined types were placed in this encounter.  All questions were answered. The patient knows to call the clinic with any problems, questions or concerns. No barriers to learning was detected. The total time spent in the appointment was 30 minutes.     [name], MD 11/03/2020   I, Wilburn Mylar, am acting as scribe for [name], MD.   I have reviewed the above documentation for accuracy and completeness, and I agree with the above.

## 2020-11-04 ENCOUNTER — Telehealth: Payer: Self-pay | Admitting: Hematology

## 2020-11-04 NOTE — Telephone Encounter (Signed)
Scheduled follow-up appointments per 7/14 los. Patient is aware. 

## 2020-11-05 ENCOUNTER — Inpatient Hospital Stay: Payer: 59

## 2020-11-05 ENCOUNTER — Other Ambulatory Visit: Payer: Self-pay

## 2020-11-05 VITALS — BP 105/65 | HR 72 | Temp 98.2°F | Resp 20

## 2020-11-05 DIAGNOSIS — C169 Malignant neoplasm of stomach, unspecified: Secondary | ICD-10-CM

## 2020-11-05 MED ORDER — HEPARIN SOD (PORK) LOCK FLUSH 100 UNIT/ML IV SOLN
500.0000 [IU] | Freq: Once | INTRAVENOUS | Status: DC | PRN
  Filled 2020-11-05: qty 5

## 2020-11-05 MED ORDER — HEPARIN SOD (PORK) LOCK FLUSH 100 UNIT/ML IV SOLN
250.0000 [IU] | Freq: Once | INTRAVENOUS | Status: DC | PRN
  Filled 2020-11-05: qty 5

## 2020-11-15 ENCOUNTER — Encounter: Payer: Self-pay | Admitting: Hematology

## 2020-11-15 NOTE — Progress Notes (Signed)
                                                                                                                                                             Patient Name: Sheryl PICCIANO MRN: [id number] DOB: 02/14/1966 Referring Physician: [name] (Profile Not Attached) Date of Service: 10/26/2020 [facility], [place]                                                        End Of Treatment Note  Diagnoses: C79.51-Secondary malignant neoplasm of bone  Cancer Staging: Stage IV gastric adenocarcinoma with signet ring features metastatic to the ovary and bones  Intent: Palliative  Radiation Treatment Dates: 10/04/2020 through 10/26/2020 Site Technique Total Dose (Gy) Dose per Fx (Gy) Completed Fx Beam Energies  Femur Right: Ext_Rt Complex 37.5/37.5 2.5 15/15 15X  Pelvis: Pelvis_Left SI 3D 37.5/37.5 2.5 15/15 10X, 15X   Narrative: The patient tolerated radiation therapy relatively well.   Plan: The patient will receive a call in about one month from the radiation oncology department. She will continue follow up with Dr. Burr Medico as well.   ________________________________________________    Carola Rhine, [facility]

## 2020-11-17 ENCOUNTER — Inpatient Hospital Stay: Payer: 59 | Admitting: Hematology

## 2020-11-17 ENCOUNTER — Inpatient Hospital Stay: Payer: 59

## 2020-11-17 ENCOUNTER — Other Ambulatory Visit: Payer: Self-pay

## 2020-11-17 ENCOUNTER — Encounter: Payer: Self-pay | Admitting: Hematology

## 2020-11-17 VITALS — BP 104/69 | HR 70 | Temp 98.1°F | Resp 17 | Ht 68.0 in | Wt 130.5 lb

## 2020-11-17 DIAGNOSIS — C169 Malignant neoplasm of stomach, unspecified: Secondary | ICD-10-CM

## 2020-11-17 DIAGNOSIS — C7961 Secondary malignant neoplasm of right ovary: Secondary | ICD-10-CM

## 2020-11-17 DIAGNOSIS — Z95828 Presence of other vascular implants and grafts: Secondary | ICD-10-CM

## 2020-11-17 DIAGNOSIS — Z5111 Encounter for antineoplastic chemotherapy: Secondary | ICD-10-CM | POA: Diagnosis not present

## 2020-11-17 LAB — CMP (CANCER CENTER ONLY)
ALT: 49 U/L — ABNORMAL HIGH (ref 0–44)
AST: 40 U/L (ref 15–41)
Albumin: 3.5 g/dL (ref 3.5–5.0)
Alkaline Phosphatase: 163 U/L — ABNORMAL HIGH (ref 38–126)
Anion gap: 6 (ref 5–15)
BUN: 11 mg/dL (ref 6–20)
CO2: 26 mmol/L (ref 22–32)
Calcium: 8.9 mg/dL (ref 8.9–10.3)
Chloride: 108 mmol/L (ref 98–111)
Creatinine: 0.54 mg/dL (ref 0.44–1.00)
GFR, Estimated: >60 mL/min (ref >60)
Glucose, Bld: 94 mg/dL (ref 70–99)
Potassium: 4.5 mmol/L (ref 3.5–5.1)
Sodium: 140 mmol/L (ref 135–145)
Total Bilirubin: 0.3 mg/dL (ref 0.3–1.2)
Total Protein: 6.7 g/dL (ref 6.5–8.1)

## 2020-11-17 LAB — CBC WITH DIFFERENTIAL (CANCER CENTER ONLY)
Abs Immature Granulocytes: 0 10*3/uL (ref 0.00–0.07)
Basophils Absolute: 0 10*3/uL (ref 0.0–0.1)
Basophils Relative: 1 %
Eosinophils Absolute: 0.1 10*3/uL (ref 0.0–0.5)
Eosinophils Relative: 2 %
HCT: 34.4 % — ABNORMAL LOW (ref 36.0–46.0)
Hemoglobin: 11.4 g/dL — ABNORMAL LOW (ref 12.0–15.0)
Immature Granulocytes: 0 %
Lymphocytes Relative: 30 %
Lymphs Abs: 0.9 10*3/uL (ref 0.7–4.0)
MCH: 32.9 pg (ref 26.0–34.0)
MCHC: 33.1 g/dL (ref 30.0–36.0)
MCV: 99.4 fL (ref 80.0–100.0)
Monocytes Absolute: 0.5 10*3/uL (ref 0.1–1.0)
Monocytes Relative: 16 %
Neutro Abs: 1.6 10*3/uL — ABNORMAL LOW (ref 1.7–7.7)
Neutrophils Relative %: 51 %
Platelet Count: 148 10*3/uL — ABNORMAL LOW (ref 150–400)
RBC: 3.46 MIL/uL — ABNORMAL LOW (ref 3.87–5.11)
RDW: 18.5 % — ABNORMAL HIGH (ref 11.5–15.5)
WBC Count: 3.1 10*3/uL — ABNORMAL LOW (ref 4.0–10.5)
nRBC: 0 % (ref 0.0–0.2)

## 2020-11-17 MED ORDER — LEUCOVORIN CALCIUM INJECTION 350 MG
400.0000 mg/m2 | Freq: Once | INTRAVENOUS | Status: AC
  Administered 2020-11-17: 636 mg via INTRAVENOUS
  Filled 2020-11-17: qty 31.8

## 2020-11-17 MED ORDER — PALONOSETRON HCL INJECTION 0.25 MG/5ML
0.2500 mg | Freq: Once | INTRAVENOUS | Status: AC
  Administered 2020-11-17: 0.25 mg via INTRAVENOUS

## 2020-11-17 MED ORDER — ALPRAZOLAM 0.25 MG PO TABS: 0.2500 mg | ORAL_TABLET | Freq: Two times a day (BID) | ORAL | 0 refills | Status: AC | PRN

## 2020-11-17 MED ORDER — DEXTROSE 5 % IV SOLN
Freq: Once | INTRAVENOUS | Status: AC
  Filled 2020-11-17: qty 250

## 2020-11-17 MED ORDER — SODIUM CHLORIDE 0.9 % IV SOLN
2400.0000 mg/m2 | INTRAVENOUS | Status: DC
  Administered 2020-11-17: 3800 mg via INTRAVENOUS
  Filled 2020-11-17: qty 76

## 2020-11-17 MED ORDER — DEXAMETHASONE SODIUM PHOSPHATE 10 MG/ML IJ SOLN
5.0000 mg | Freq: Once | INTRAMUSCULAR | Status: AC
  Administered 2020-11-17: 5 mg via INTRAVENOUS

## 2020-11-17 MED ORDER — SODIUM CHLORIDE 0.9 % IV SOLN: 5.0000 mg | Freq: Once | INTRAVENOUS | Status: DC

## 2020-11-17 MED ORDER — OXALIPLATIN CHEMO INJECTION 100 MG/20ML
85.0000 mg/m2 | Freq: Once | INTRAVENOUS | Status: AC
  Administered 2020-11-17: 135 mg via INTRAVENOUS
  Filled 2020-11-17: qty 20

## 2020-11-17 MED ORDER — SODIUM CHLORIDE 0.9% FLUSH
10.0000 mL | Freq: Once | INTRAVENOUS | Status: AC
  Administered 2020-11-17: 10 mL
  Filled 2020-11-17: qty 10

## 2020-11-17 MED ORDER — PALONOSETRON HCL INJECTION 0.25 MG/5ML
INTRAVENOUS | Status: AC
  Filled 2020-11-17: qty 5

## 2020-11-17 MED ORDER — DEXAMETHASONE SODIUM PHOSPHATE 10 MG/ML IJ SOLN
INTRAMUSCULAR | Status: AC
  Filled 2020-11-17: qty 1

## 2020-11-17 NOTE — Progress Notes (Signed)
Ok to treat with elevated ANC per Dr. Burr Medico

## 2020-11-17 NOTE — Progress Notes (Signed)
Sheryl Pratt   [phone] [phone]   Clinic Follow up Note   Patient Care Team: Lorrene Reid, PA-C as PCP - General Christene Lye, MD (General Surgery) [facility], [place] [name], MD as Consulting Physician (Hematology and Oncology) [name], RN (Inactive) as Oncology Nurse Navigator [name], MD as Consulting Physician (Gynecologic Oncology)  Date of Service:  11/17/2020  CHIEF COMPLAINT: f/u of metastatic gastric adenocarcinoma  SUMMARY OF ONCOLOGIC HISTORY: Oncology History Overview Note  Cancer Staging Gastric adenocarcinoma [facility]) Staging form: Stomach, AJCC 8th Edition - Clinical stage from 08/26/2020: Stage IVB (cTX, cN0, pM1) - Signed by [name], MD on 09/06/2020 Stage prefix: Initial diagnosis Total positive nodes: 0 Histologic grade (G): G3 Histologic grading system: 3 grade system    Gastric adenocarcinoma ([place])  07/30/2020 Imaging   CT Abdomen and Pelvis with Contrast  IMPRESSION: 1. Large hypervascular soft tissue mass occupies the pelvis, compressing the urinary bladder and GI structures. No definite connection of this mass is seen to the vaginal cuff. The ovaries are not identified. 2. Enlarged left adrenal gland. Given the presence of soft tissue mass in the pelvis, this may represent metastatic disease to the adrenal gland. 3. Further evaluation with MRI of the abdomen and pelvis may be considered. Surgical consultation is also advised. 4. Right lower pole nonobstructive nephrolithiasis. 5. Aortic atherosclerosis.     08/09/2020 Procedure   Exploratory laparotomy with right salpingo-oophorectomy, omentectomy washings, peritoneal biopsies, and omentectomy under the care of Dr. Berline Lopes    08/09/2020 Pathology Results   Clinical History: Pelvic mass (crm)      FINAL MICROSCOPIC DIAGNOSIS:   A. ADNEXAL MASS, RIGHT, SALPINGO OOPHORECTOMY:  - Adenocarcinoma with signet ring  cell features.  - No ovarian surface involvement identified.  - Benign Fallopian tube.  - See comment.   The right adnexal mass is 17.8 cm in greatest dimension and is diffusely  involved by adenocarcinoma with diffuse signet ring cell features.  Immunohistochemistry shows positivity with cytokeratin 7 and cytokeratin  20.  The tumor is negative with estrogen receptor, GATA3, GCDFP, CD56,  chromogranin, synaptic ficin, calretinin, inhibin, Napsin A, TTF-1, WT1  and PAX 8.  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  Possible primary sites  include upper gastrointestinal tract and pancreas.   08/19/2020 Initial Diagnosis   Adenocarcinoma ([place])   08/26/2020 Procedure   Upper Endoscopy by Dr Ardis Hughs  IMPRESSION - There were no overtly neoplastic findings. - There was mild, non-specific gastritis and bulbar duodenitis. Both sites were biopsied given large pelvic mass pathology.   08/26/2020 Pathology Results   Surgical [P], gastric antrum and gastric body bx - POORLY DIFFERENTIATED ADENOCARCINOMA WITH SIGNET RING FEATURES. - SEE MICROSCOPIC DESCRIPTION.    08/26/2020 Cancer Staging   Staging form: Stomach, AJCC 8th Edition - Clinical stage from 08/26/2020: Stage IVB (cTX, cN0, pM1) - Signed by [name], MD on 09/06/2020  Stage prefix: Initial diagnosis  Total positive nodes: 0  Histologic grade (G): G3  Histologic grading system: 3 grade system    08/29/2020 PET scan   IMPRESSION: 1. Dominant finding is multifocal hypermetabolic skeletal metastasis. Lesions are subtly evident on CT with subtle sclerosis. Lesions include the pelvis, spine and ribs. 2. Hypermetabolic metastasis in the RIGHT femoral neck with sclerosis may be at risk for pathologic fracture. 3. Resection of large pelvic mass. No residual carcinoma evident within the pelvis. 4. Benign LEFT  adrenal adenoma. 5. Diffuse mild metabolic activity in the gastric  fundus and proximal body is nonspecific. Recommend correlation with recent upper endoscopy.     09/08/2020 -  Chemotherapy   First-line FOLFOX q2weeks starting 09/08/20. Held 09/30/20 to proceed with palliative radiation. Restarted 11/03/20    09/26/2020 Genetic Testing   Negative hereditary cancer genetic testing: no pathogenic variants detected in [facility] CustomNext-Cancer +RNAinsight Panel.  Variants of uncertain significance detected in LZTR1 at  p.K452E (c.1354A>G) and MLH1 at p.P747S (c.2239C>T).  The report date is September 21, 2020.   The CustomNext-Cancer +RNAinsight Panel offered by [facility] and includes sequencing and rearrangement analysis for the following 91 genes: AIP, ALK, APC, ATM, AXIN2, BAP1, BARD1, BLM, BMPR1A, BRCA1, BRCA2, BRIP1, CDC73, CDH1, CDK4, CDKN1B, CDKN2A, CHEK2, CTNNA1, DICER1, FANCC, FH, FLCN, GALNT12, KIF1B, LZTR1, MAX, MEN1, MET, MLH1, MRE11A, MSH2, MSH3, MSH6, MUTYH, NBN, NF1, NF2, NTHL1, PALB2, PHOX2B, PMS2, POT1, PRKAR1A, PTCH1, PTEN, RAD50, RAD51C, RAD51D, RB1, RECQL, RET, SDHA, SDHAF2, SDHB, SDHC, SDHD, SMAD4, SMARCA4, SMARCB1, SMARCE1, STK11, SUFU, TMEM127, TP53, TSC1, TSC2, VHL and XRCC2 (sequencing and deletion/duplication); CASR, CFTR, CPA1, CTRC, EGFR, EGLN1, FAM175A, HOXB13, KIT, MITF, MLH3, PALLD, PDGFRA, POLD1, POLE, PRSS1, RINT1, RPS20, SPINK1 and TERT (sequencing only); EPCAM and GREM1 (deletion/duplication only). RNA data is routinely analyzed for use in variant interpretation for all genes.   10/04/2020 - 10/26/2020 Radiation Therapy   Palliative Radiation to right hip/lumbar spine with Dr Lisbeth Renshaw   Site Technique Total Dose (Gy) Dose per Fx (Gy) Completed Fx Beam Energies  Femur Right: Ext_Rt Complex 37.5/37.5 2.5 15/15 15X  Pelvis: Pelvis_Left SI 3D 37.5/37.5 2.5 15/15 10X, 15X       CURRENT THERAPY:  First line chemo FOLFOX every 2 weeks, on hold during radiation   INTERVAL HISTORY:  Sheryl Pratt is here for a follow up of metastatic gastric  adenocarcinoma. She was last seen by me on 11/03/20. She presents to the clinic alone. She reports she tolerated her last cycle relatively well, with moderate to severe fatigue for the first 3-5 days. She wonders if we could lower the dose because of this. She also reports some issues with her hands and feet. She notes her feet feel like they're burning, especially at night. She notes some anxiety related to her treatments. She notes it can impact her function somewhat. She wonders if the anxiety increases when her premed steroid wears off.   All other systems were reviewed with the patient and are negative.  MEDICAL HISTORY:  Past Medical History:  Diagnosis Date   Allergy    Arthritis    Bloating    Breast mass, right    Cancer ([place])    metastaic to bones and abd, May 2022   Constipation    Family history of gastric cancer 09/08/2020   Family history of ovarian cancer 09/08/2020   Family history of pancreatic cancer 09/08/2020   Family history of skin cancer 09/08/2020   Migraine    Pelvic mass    PONV (postoperative nausea and vomiting)    Seasonal allergies    Vitamin D deficiency     SURGICAL HISTORY: Past Surgical History:  Procedure Laterality Date   APPENDECTOMY     AUGMENTATION MAMMAPLASTY Bilateral    BREAST BIOPSY Right 2014   fibroadenoma   BREAST SURGERY  18 years ago   augmentation.   IR IMAGING GUIDED PORT INSERTION  09/06/2020   LAPAROTOMY  08/09/2020   Procedure: EXPLORATORY LAPAROTOMY, MASS EXCISION, PERITONEAL BIOPSIES;  Surgeon:  [name], MD;  Location: [facility];  Service: Gynecology;;   OMENTECTOMY N/A 08/09/2020   Procedure: OMENTECTOMY;  Surgeon: [name], MD;  Location: [facility];  Service: Gynecology;  Laterality: N/A;   SALPINGOOPHORECTOMY Right 08/09/2020   Procedure: OPEN RIGHT SALPINGO OOPHORECTOMY;  Surgeon: [name], MD;  Location: [facility];  Service: Gynecology;  Laterality: Right;   VAGINAL HYSTERECTOMY  2015    I have  reviewed the social history and family history with the patient and they are unchanged from previous note.  ALLERGIES:  is allergic to codeine and wellbutrin [bupropion].  MEDICATIONS:  Current Outpatient Medications  Medication Sig Dispense Refill   ALPRAZolam (XANAX) 0.25 MG tablet Take 1 tablet (0.25 mg total) by mouth 2 (two) times daily as needed for anxiety. 20 tablet 0   acetaminophen (TYLENOL) 325 MG tablet Take by mouth every 6 (six) hours as needed for mild pain.     cholecalciferol (VITAMIN D3) 25 MCG (1000 UNIT) tablet Take 1,000 Units by mouth daily.     diphenhydrAMINE (BENADRYL) 25 mg capsule Take 25 mg by mouth at bedtime as needed for allergies.     ibuprofen (ADVIL) 800 MG tablet Take 1 tablet (800 mg total) by mouth every 8 (eight) hours as needed for moderate pain. For AFTER surgery only 30 tablet 0   Ibuprofen-diphenhydrAMINE HCl (IBUPROFEN PM) 200-25 MG CAPS Take 1 tablet by mouth at bedtime as needed (sleep).     ipratropium (ATROVENT) 0.03 % nasal spray Place 2 sprays into both nostrils every 12 (twelve) hours. 30 mL 0   lidocaine-prilocaine (EMLA) cream Apply to affected area once 30 g 3   Multiple Vitamin (MULTIVITAMIN) tablet Take 1 tablet by mouth daily.     ondansetron (ZOFRAN) 8 MG tablet Take 1 tablet (8 mg total) by mouth 2 (two) times daily as needed for refractory nausea / vomiting. Start on day 3 after chemotherapy. 30 tablet 1   oxyCODONE (OXY IR/ROXICODONE) 5 MG immediate release tablet Take 1 tablet (5 mg total) by mouth every 4 (four) hours as needed for severe pain. For severe pain only, do not take and drive 20 tablet 0   prochlorperazine (COMPAZINE) 10 MG tablet Take 1 tablet (10 mg total) by mouth every 6 (six) hours as needed (Nausea or vomiting). 30 tablet 1   Psyllium (METAMUCIL PO) Take 1 Dose by mouth daily.     senna-docusate (SENOKOT-S) 8.6-50 MG tablet Take 2 tablets by mouth at bedtime. Do not take if having diarrhea (Patient taking differently:  Take 2 tablets by mouth daily. Do not take if having diarrhea) 60 tablet 0   Tetrahydrozoline HCl (VISINE OP) Place 1 drop into both eyes daily as needed (itching Sheryl Pratt).     traMADol (ULTRAM) 50 MG tablet Take 1 tablet (50 mg total) by mouth every 6 (six) hours as needed for severe pain. For AFTER surgery, do not take and drive 10 tablet 0   vitamin B-12 (CYANOCOBALAMIN) 1000 MCG tablet Take 1,000 mcg by mouth daily.     vitamin C (ASCORBIC ACID) 500 MG tablet Take 500 mg by mouth daily.     Vitamin D, Ergocalciferol, (DRISDOL) 1.25 MG (50000 UT) CAPS capsule Take one tablet wkly 12 capsule 3   vitamin E 180 MG (400 UNITS) capsule Take 400 Units by mouth daily.     No current facility-administered medications for this visit.   Facility-Administered Medications Ordered in Other Visits  Medication Dose Route Frequency Provider Last Rate  Last Admin   fluorouracil (ADRUCIL) 3,800 mg in sodium chloride 0.9 % 74 mL chemo infusion  2,400 mg/m2 (Treatment Plan Recorded) Intravenous 1 day or 1 dose [name], MD   3,800 mg at 11/17/20 1631    PHYSICAL EXAMINATION: ECOG PERFORMANCE STATUS: 1 - Symptomatic but completely ambulatory  Vitals:   11/17/20 1159  BP: 104/69  Pulse: 70  Resp: 17  Temp: 98.1 F (36.7 C)  SpO2: 100%   Filed Weights   11/17/20 1159  Weight: 130 lb 8 oz (59.2 kg)   GENERAL:alert, no distress and comfortable SKIN: skin color, texture, turgor are normal, no rashes or significant lesions EYES: normal, Conjunctiva are pink and non-injected, sclera clear  Musculoskeletal:no cyanosis of digits and no clubbing  NEURO: alert & oriented x 3 with fluent speech, no focal motor/sensory deficits; sensation in fingers normal.  LABORATORY DATA:  I have reviewed the data as listed CBC Latest Ref Rng & Units 11/17/2020 11/03/2020 10/13/2020  WBC 4.0 - 10.5 K/uL 3.1(L) 3.7(L) 3.8(L)  Hemoglobin 12.0 - 15.0 g/dL 11.4(L) 11.8(L) 11.3(L)  Hematocrit 36.0 - 46.0 % 34.4(L) 35.2(L)  33.6(L)  Platelets 150 - 400 K/uL 148(L) 180 211     CMP Latest Ref Rng & Units 11/17/2020 11/03/2020 10/13/2020  Glucose 70 - 99 mg/dL 94 85 108(H)  BUN 6 - 20 mg/dL _0 Creatinine 0.44 - 1.00 mg/dL 0.54 0.59 0.63  Sodium 135 - 145 mmol/L 140 141 140  Potassium 3.5 - 5.1 mmol/L 4.5 4.2 4.1  Chloride 98 - 111 mmol/L 108 108 105  CO2 22 - 32 mmol/L _1 Calcium 8.9 - 10.3 mg/dL 8.9 9.2 8.9  Total Protein 6.5 - 8.1 g/dL 6.7 6.9 6.9  Total Bilirubin 0.3 - 1.2 mg/dL 0.3 0.5 0.4  Alkaline Phos 38 - 126 U/L 163(H) 153(H) 278(H)  AST 15 - 41 U/L 40 20 45(H)  ALT 0 - 44 U/L 49(H) 20 73(H)      RADIOGRAPHIC STUDIES: I have personally reviewed the radiological images as listed and agreed with the findings in the report. No results found.   ASSESSMENT & PLAN:  AANYAH LOA is a 55 y.o. female with   1. 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 Enlarged left adrenal gland.    -On 08/09/20, she underwent diagnostic laparoscopy with right salpingo-oophorectomy, omentectomy washings, peritoneal biopsies, and omentectomy to assure the origin of the mass. The large ovarian mass showed adenocarcinoma with signet ring cell features, IHC studies liver upper GI primary. Biopsy of other pelvic tissue was negative for metastasis. -She underwent EGD with Dr. Ardis Hughs on 08/26/20. Final pathology showed poorly differentiated adenofibroma with signet rings features. This is the same as her ovarian pathology.  -Her 08/29/20 PET showed multifocal hypermetabolic skeletal metastasis, Hypermetabolic metastasis in the RIGHT femoral neck with sclerosis may be at risk for pathologic fracture, she received palliative RT  --[facility] results showed MSI stable disease with low mutation rate, no  targetable mutation. HER2(-). Her PD-L1 was negative, so she is not a candidate for immunotherapy as first-line. -I started her on first-line chemo with FOLFOX every 2 weeks on 09/08/20. Goal of therapy is palliative. Plan to scan her after 3 months treatment.  -Labs reviewed, adequate to proceed with C4. -plan to repeat staging scan after  cycle 6    2. Lower back pain secondary to bone mets, muscular/nerve pains -The patient will continue to take NSAIDs for pain control. If needed, she has a prescription for tramadol and oxycodone. -Improved with palliative radiation therapy to the right hip and left pelvis 09/30/20 through 10/26/20.  3. Social Support -She and her husband bought a house in [place], where their children live. They planned to retire there.  -She reports anxiety related to her treatments. She is interested in trying medication. I will prescribe Xanax for her today.   4. Family History of Cancer  -The patient's brother had pancreatic cancer, diagnosed at age 35. She notes unknown cancer in another family member. -Genetic testing done 09/08/20 was negative, with VUS in MLH1 and LZTR1.     PLAN: -proceed with C4 FOLFOX today -prescribed Xanax today -labs, f/u, and C5 in 2 weeks -will discuss zometa with her on next visit    No problem-specific Assessment & Plan notes found for this encounter.   No orders of the defined types were placed in this encounter.  All questions were answered. The patient knows to call the clinic with any problems, questions or concerns. No barriers to learning was detected. The total time spent in the appointment was 30 minutes.     [name], MD 11/17/2020   I, Wilburn Mylar, am acting as scribe for [name], MD.   I have reviewed the above documentation for accuracy and completeness, and I agree with the above.

## 2020-11-19 ENCOUNTER — Inpatient Hospital Stay: Payer: 59

## 2020-11-19 ENCOUNTER — Other Ambulatory Visit: Payer: Self-pay

## 2020-11-19 VITALS — BP 102/63 | HR 75 | Temp 98.1°F | Resp 18

## 2020-11-19 DIAGNOSIS — C169 Malignant neoplasm of stomach, unspecified: Secondary | ICD-10-CM

## 2020-11-19 DIAGNOSIS — Z5111 Encounter for antineoplastic chemotherapy: Secondary | ICD-10-CM | POA: Diagnosis not present

## 2020-11-19 MED ORDER — SODIUM CHLORIDE 0.9% FLUSH
10.0000 mL | INTRAVENOUS | Status: DC | PRN
  Administered 2020-11-19: 10 mL
  Filled 2020-11-19: qty 10

## 2020-11-19 MED ORDER — HEPARIN SOD (PORK) LOCK FLUSH 100 UNIT/ML IV SOLN
500.0000 [IU] | Freq: Once | INTRAVENOUS | Status: AC | PRN
  Administered 2020-11-19: 500 [IU]
  Filled 2020-11-19: qty 5

## 2020-11-19 NOTE — Patient Instructions (Signed)
Implanted Port Home Guide An implanted port is a device that is placed under the skin. It is usually placed in the chest. The device can be used to give IV medicine, to take blood, or for dialysis. You may have an implanted port if: You need IV medicine that would be irritating to the small veins in your hands or arms. You need IV medicines, such as antibiotics, for a long period of time. You need IV nutrition for a long period of time. You need dialysis. When you have a port, your health care provider can choose to use the port instead of veins in your arms for these procedures. You may have fewer limitations when using a port than you would if you used other types of long-term IVs, and you will likely be able to return to normal activities afteryour incision heals. An implanted port has two main parts: Reservoir. The reservoir is the part where a needle is inserted to give medicines or draw blood. The reservoir is round. After it is placed, it appears as a small, raised area under your skin. Catheter. The catheter is a thin, flexible tube that connects the reservoir to a vein. Medicine that is inserted into the reservoir goes into the catheter and then into the vein. How is my port accessed? To access your port: A numbing cream may be placed on the skin over the port site. Your health care provider will put on a mask and sterile gloves. The skin over your port will be cleaned carefully with a germ-killing soap and allowed to dry. Your health care provider will gently pinch the port and insert a needle into it. Your health care provider will check for a blood return to make sure the port is in the vein and is not clogged. If your port needs to remain accessed to get medicine continuously (constant infusion), your health care provider will place a clear bandage (dressing) over the needle site. The dressing and needle will need to be changed every week, or as told by your health care provider. What  is flushing? Flushing helps keep the port from getting clogged. Follow instructions from your health care provider about how and when to flush the port. Ports are usually flushed with saline solution or a medicine called heparin. The need for flushing will depend on how the port is used: If the port is only used from time to time to give medicines or draw blood, the port may need to be flushed: Before and after medicines have been given. Before and after blood has been drawn. As part of routine maintenance. Flushing may be recommended every 4-6 weeks. If a constant infusion is running, the port may not need to be flushed. Throw away any syringes in a disposal container that is meant for sharp items (sharps container). You can buy a sharps container from a pharmacy, or you can make one by using an empty hard plastic bottle with a cover. How long will my port stay implanted? The port can stay in for as long as your health care provider thinks it is needed. When it is time for the port to come out, a surgery will be done to remove it. The surgery will be similar to the procedure that was done to putthe port in. Follow these instructions at home:  Flush your port as told by your health care provider. If you need an infusion over several days, follow instructions from your health care provider about how to take   care of your port site. Make sure you: Wash your hands with soap and water before you change your dressing. If soap and water are not available, use alcohol-based hand sanitizer. Change your dressing as told by your health care provider. Place any used dressings or infusion bags into a plastic bag. Throw that bag in the trash. Keep the dressing that covers the needle clean and dry. Do not get it wet. Do not use scissors or sharp objects near the tube. Keep the tube clamped, unless it is being used. Check your port site every day for signs of infection. Check for: Redness, swelling, or  pain. Fluid or blood. Pus or a bad smell. Protect the skin around the port site. Avoid wearing bra straps that rub or irritate the site. Protect the skin around your port from seat belts. Place a soft pad over your chest if needed. Bathe or shower as told by your health care provider. The site may get wet as long as you are not actively receiving an infusion. Return to your normal activities as told by your health care provider. Ask your health care provider what activities are safe for you. Carry a medical alert card or wear a medical alert bracelet at all times. This will let health care providers know that you have an implanted port in case of an emergency. Get help right away if: You have redness, swelling, or pain at the port site. You have fluid or blood coming from your port site. You have pus or a bad smell coming from the port site. You have a fever. Summary Implanted ports are usually placed in the chest for long-term IV access. Follow instructions from your health care provider about flushing the port and changing bandages (dressings). Take care of the area around your port by avoiding clothing that puts pressure on the area, and by watching for signs of infection. Protect the skin around your port from seat belts. Place a soft pad over your chest if needed. Get help right away if you have a fever or you have redness, swelling, pain, drainage, or a bad smell at the port site. This information is not intended to replace advice given to you by your health care provider. Make sure you discuss any questions you have with your healthcare provider. Document Revised: 08/24/2019 Document Reviewed: 08/24/2019 Elsevier Patient Education  2022 [employer]  

## 2020-12-01 ENCOUNTER — Inpatient Hospital Stay: Payer: 59 | Attending: Gynecologic Oncology

## 2020-12-01 ENCOUNTER — Inpatient Hospital Stay: Payer: 59 | Admitting: Hematology

## 2020-12-01 ENCOUNTER — Encounter: Payer: Self-pay | Admitting: Hematology

## 2020-12-01 ENCOUNTER — Other Ambulatory Visit: Payer: Self-pay

## 2020-12-01 ENCOUNTER — Inpatient Hospital Stay: Payer: 59

## 2020-12-01 VITALS — BP 98/59 | HR 69 | Temp 98.7°F | Resp 18 | Wt 130.5 lb

## 2020-12-01 DIAGNOSIS — Z452 Encounter for adjustment and management of vascular access device: Secondary | ICD-10-CM | POA: Insufficient documentation

## 2020-12-01 DIAGNOSIS — C7961 Secondary malignant neoplasm of right ovary: Secondary | ICD-10-CM | POA: Diagnosis present

## 2020-12-01 DIAGNOSIS — C7951 Secondary malignant neoplasm of bone: Secondary | ICD-10-CM | POA: Diagnosis not present

## 2020-12-01 DIAGNOSIS — G629 Polyneuropathy, unspecified: Secondary | ICD-10-CM | POA: Diagnosis not present

## 2020-12-01 DIAGNOSIS — C169 Malignant neoplasm of stomach, unspecified: Secondary | ICD-10-CM | POA: Diagnosis not present

## 2020-12-01 DIAGNOSIS — Z95828 Presence of other vascular implants and grafts: Secondary | ICD-10-CM

## 2020-12-01 DIAGNOSIS — Z5111 Encounter for antineoplastic chemotherapy: Secondary | ICD-10-CM | POA: Insufficient documentation

## 2020-12-01 LAB — CMP (CANCER CENTER ONLY)
ALT: 39 U/L (ref 0–44)
AST: 34 U/L (ref 15–41)
Albumin: 3.6 g/dL (ref 3.5–5.0)
Alkaline Phosphatase: 163 U/L — ABNORMAL HIGH (ref 38–126)
Anion gap: 9 (ref 5–15)
BUN: 12 mg/dL (ref 6–20)
CO2: 27 mmol/L (ref 22–32)
Calcium: 9.3 mg/dL (ref 8.9–10.3)
Chloride: 105 mmol/L (ref 98–111)
Creatinine: 0.64 mg/dL (ref 0.44–1.00)
GFR, Estimated: >60 mL/min (ref >60)
Glucose, Bld: 97 mg/dL (ref 70–99)
Potassium: 3.8 mmol/L (ref 3.5–5.1)
Sodium: 141 mmol/L (ref 135–145)
Total Bilirubin: 0.3 mg/dL (ref 0.3–1.2)
Total Protein: 7 g/dL (ref 6.5–8.1)

## 2020-12-01 LAB — CBC WITH DIFFERENTIAL (CANCER CENTER ONLY)
Abs Immature Granulocytes: 0.01 10*3/uL (ref 0.00–0.07)
Basophils Absolute: 0 10*3/uL (ref 0.0–0.1)
Basophils Relative: 1 %
Eosinophils Absolute: 0.1 10*3/uL (ref 0.0–0.5)
Eosinophils Relative: 2 %
HCT: 38 % (ref 36.0–46.0)
Hemoglobin: 12.6 g/dL (ref 12.0–15.0)
Immature Granulocytes: 0 %
Lymphocytes Relative: 26 %
Lymphs Abs: 1.1 10*3/uL (ref 0.7–4.0)
MCH: 32.7 pg (ref 26.0–34.0)
MCHC: 33.2 g/dL (ref 30.0–36.0)
MCV: 98.7 fL (ref 80.0–100.0)
Monocytes Absolute: 0.7 10*3/uL (ref 0.1–1.0)
Monocytes Relative: 16 %
Neutro Abs: 2.5 10*3/uL (ref 1.7–7.7)
Neutrophils Relative %: 55 %
Platelet Count: 142 10*3/uL — ABNORMAL LOW (ref 150–400)
RBC: 3.85 MIL/uL — ABNORMAL LOW (ref 3.87–5.11)
RDW: 18 % — ABNORMAL HIGH (ref 11.5–15.5)
WBC Count: 4.4 10*3/uL (ref 4.0–10.5)
nRBC: 0 % (ref 0.0–0.2)

## 2020-12-01 MED ORDER — GABAPENTIN 100 MG PO CAPS: 100.0000 mg | ORAL_CAPSULE | Freq: Every day | ORAL | 0 refills | Status: DC

## 2020-12-01 MED ORDER — PALONOSETRON HCL INJECTION 0.25 MG/5ML
0.2500 mg | Freq: Once | INTRAVENOUS | Status: AC
Start: 2020-12-01 — End: 2020-12-01
  Administered 2020-12-01: 0.25 mg via INTRAVENOUS
  Filled 2020-12-01: qty 5

## 2020-12-01 MED ORDER — SODIUM CHLORIDE 0.9 % IV SOLN
2400.0000 mg/m2 | INTRAVENOUS | Status: DC
  Administered 2020-12-01: 3800 mg via INTRAVENOUS
  Filled 2020-12-01: qty 76

## 2020-12-01 MED ORDER — DEXAMETHASONE SODIUM PHOSPHATE 10 MG/ML IJ SOLN
5.0000 mg | Freq: Once | INTRAMUSCULAR | Status: AC
  Administered 2020-12-01: 5 mg via INTRAVENOUS
  Filled 2020-12-01: qty 1

## 2020-12-01 MED ORDER — LEUCOVORIN CALCIUM INJECTION 350 MG
400.0000 mg/m2 | Freq: Once | INTRAVENOUS | Status: AC
  Administered 2020-12-01: 636 mg via INTRAVENOUS
  Filled 2020-12-01: qty 31.8

## 2020-12-01 MED ORDER — HEPARIN SOD (PORK) LOCK FLUSH 100 UNIT/ML IV SOLN
500.0000 [IU] | Freq: Once | INTRAVENOUS | Status: DC | PRN
  Filled 2020-12-01: qty 5

## 2020-12-01 MED ORDER — SODIUM CHLORIDE 0.9% FLUSH
10.0000 mL | INTRAVENOUS | Status: DC | PRN
  Filled 2020-12-01: qty 10

## 2020-12-01 MED ORDER — SODIUM CHLORIDE 0.9 % IV SOLN: 5.0000 mg | Freq: Once | INTRAVENOUS | Status: DC

## 2020-12-01 MED ORDER — DEXTROSE 5 % IV SOLN
Freq: Once | INTRAVENOUS | Status: AC
  Filled 2020-12-01: qty 250

## 2020-12-01 MED ORDER — SODIUM CHLORIDE 0.9% FLUSH
10.0000 mL | Freq: Once | INTRAVENOUS | Status: AC
  Administered 2020-12-01: 10 mL
  Filled 2020-12-01: qty 10

## 2020-12-01 MED ORDER — OXALIPLATIN CHEMO INJECTION 100 MG/20ML
70.0000 mg/m2 | Freq: Once | INTRAVENOUS | Status: AC
  Administered 2020-12-01: 110 mg via INTRAVENOUS
  Filled 2020-12-01: qty 20

## 2020-12-01 NOTE — Patient Instructions (Addendum)
[facility]    Discharge Instructions: Thank you for choosing [facility] to provide your oncology and hematology care.   If you have a lab appointment with the [facility], please go directly to the [facility] and check in at the registration area.   Wear comfortable clothing and clothing appropriate for easy access to any Portacath or PICC line.   We strive to give you quality time with your provider. You may need to reschedule your appointment if you arrive late (15 or more minutes).  Arriving late affects you and other patients whose appointments are after yours.  Also, if you miss three or more appointments without notifying the office, you may be dismissed from the clinic at the provider's discretion.      For prescription refill requests, have your pharmacy contact our office and allow 72 hours for refills to be completed.    Today you received the following chemotherapy and/or immunotherapy agents: oxaliplatin, leucovorin, and fluorouracil.      To help prevent nausea and vomiting after your treatment, we encourage you to take your nausea medication as directed.  BELOW ARE SYMPTOMS THAT SHOULD BE REPORTED IMMEDIATELY: *FEVER GREATER THAN 100.4 F (38 C) OR HIGHER *CHILLS OR SWEATING *NAUSEA AND VOMITING THAT IS NOT CONTROLLED WITH YOUR NAUSEA MEDICATION *UNUSUAL SHORTNESS OF BREATH *UNUSUAL BRUISING OR BLEEDING *URINARY PROBLEMS (pain or burning when urinating, or frequent urination) *BOWEL PROBLEMS (unusual diarrhea, constipation, pain near the anus) TENDERNESS IN MOUTH AND THROAT WITH OR WITHOUT PRESENCE OF ULCERS (sore throat, sores in mouth, or a toothache) UNUSUAL RASH, SWELLING OR PAIN  UNUSUAL VAGINAL DISCHARGE OR ITCHING   Items with * indicate a potential emergency and should be followed up as soon as possible or go to the Emergency Department if any problems should occur.  Please show the CHEMOTHERAPY ALERT CARD or  IMMUNOTHERAPY ALERT CARD at check-in to the Emergency Department and triage nurse.  Should you have questions after your visit or need to cancel or reschedule your appointment, please contact [facility]  Dept: [phone]  and follow the prompts.  Office hours are 8:00 a.m. to 4:30 p.m. Monday - Friday. Please note that voicemails left after 4:00 p.m. may not be returned until the following business day.  We are closed weekends and major holidays. You have access to a nurse at all times for urgent questions. Please call the main number to the clinic Dept: [phone] and follow the prompts.   For any non-urgent questions, you may also contact your provider using MyChart. We now offer e-Visits for anyone 43 and older to request care online for non-urgent symptoms. For details visit [email or web address].   Also download the MyChart app! Go to the app store, search "MyChart", open the app, select [facility], and log in with your MyChart username and password.  Due to Covid, a mask is required upon entering the hospital/clinic. If you do not have a mask, one will be given to you upon arrival. For doctor visits, patients may have 1 support person aged 60 or older with them. For treatment visits, patients cannot have anyone with them due to current Covid guidelines and our immunocompromised population.   [facility] Discharge Instructions for Patients receiving Home Portable Chemo Pump    **The bag should finish at 46 hours. For example, if your pump is scheduled for 46 hours and it was put on at 4pm, it should finish at  2 pm the day it is scheduled to come off regardless of your appointment time.    Estimated time to finish: 1:30 PM    ** if the display on your pump reads "Low Volume" and it is beeping, take the batteries out of the pump and come to the cancer center for it to be taken off.   **If the pump alarms go off prior to the pump reading  "Low Volume" then call the [phone] and someone can assist you.  **If the plunger comes out and the bag fluid is running out, please use your chemo spill kit to clean up the spill. Do not use paper towels or other house hold products.  ** If you have problems or questions regarding your pump, please call either the [phone] or the cancer center Monday-Friday 8:00am-4:30pm at [phone] and we will assist you.  If you are unable to get assistance then go to [facility] Emergency Room, ask the staff to contact the IV team for assistance.

## 2020-12-01 NOTE — Progress Notes (Signed)
Sheryl Pratt   [phone] Fax:([phone]   Clinic Follow up Note   Patient Care Team: Lorrene Reid, PA-C as PCP - General Christene Lye, MD (General Surgery) [facility] [place] [name], MD as Consulting Physician (Hematology and Oncology) [name], RN (Inactive) as Oncology Nurse Navigator [name], MD as Consulting Physician (Gynecologic Oncology)  Date of Service:  12/01/2020  CHIEF COMPLAINT: f/u of metastatic gastric adenocarcinoma  SUMMARY OF ONCOLOGIC HISTORY: Oncology History Overview Note  Cancer Staging Gastric adenocarcinoma [facility]) Staging form: Stomach, AJCC 8th Edition - Clinical stage from 08/26/2020: Stage IVB (cTX, cN0, pM1) - Signed by [name], MD on 09/06/2020 Stage prefix: Initial diagnosis Total positive nodes: 0 Histologic grade (G): G3 Histologic grading system: 3 grade system    Gastric adenocarcinoma ([place])  07/30/2020 Imaging   CT Abdomen and Pelvis with Contrast  IMPRESSION: 1. Large hypervascular soft tissue mass occupies the pelvis, compressing the urinary bladder and GI structures. No definite connection of this mass is seen to the vaginal cuff. The ovaries are not identified. 2. Enlarged left adrenal gland. Given the presence of soft tissue mass in the pelvis, this may represent metastatic disease to the adrenal gland. 3. Further evaluation with MRI of the abdomen and pelvis may be considered. Surgical consultation is also advised. 4. Right lower pole nonobstructive nephrolithiasis. 5. Aortic atherosclerosis.     08/09/2020 Procedure   Exploratory laparotomy with right salpingo-oophorectomy, omentectomy washings, peritoneal biopsies, and omentectomy under the care of Dr. Berline Lopes    08/09/2020 Pathology Results   Clinical History: Pelvic mass (crm)      FINAL MICROSCOPIC DIAGNOSIS:   A. ADNEXAL MASS, RIGHT, SALPINGO OOPHORECTOMY:  - Adenocarcinoma with signet ring  cell features.  - No ovarian surface involvement identified.  - Benign Fallopian tube.  - See comment.   The right adnexal mass is 17.8 cm in greatest dimension and is diffusely  involved by adenocarcinoma with diffuse signet ring cell features.  Immunohistochemistry shows positivity with cytokeratin 7 and cytokeratin  20.  The tumor is negative with estrogen receptor, GATA3, GCDFP, CD56,  chromogranin, synaptic ficin, calretinin, inhibin, Napsin A, TTF-1, WT1  and PAX 8.  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  Possible primary sites  include upper gastrointestinal tract and pancreas.   08/19/2020 Initial Diagnosis   Adenocarcinoma ([place])   08/26/2020 Procedure   Upper Endoscopy by Dr Ardis Hughs  IMPRESSION - There were no overtly neoplastic findings. - There was mild, non-specific gastritis and bulbar duodenitis. Both sites were biopsied given large pelvic mass pathology.   08/26/2020 Pathology Results   Surgical [P], gastric antrum and gastric body bx - POORLY DIFFERENTIATED ADENOCARCINOMA WITH SIGNET RING FEATURES. - SEE MICROSCOPIC DESCRIPTION.    08/26/2020 Cancer Staging   Staging form: Stomach, AJCC 8th Edition - Clinical stage from 08/26/2020: Stage IVB (cTX, cN0, pM1) - Signed by [name], MD on 09/06/2020 Stage prefix: Initial diagnosis Total positive nodes: 0 Histologic grade (G): G3 Histologic grading system: 3 grade system   08/29/2020 PET scan   IMPRESSION: 1. Dominant finding is multifocal hypermetabolic skeletal metastasis. Lesions are subtly evident on CT with subtle sclerosis. Lesions include the pelvis, spine and ribs. 2. Hypermetabolic metastasis in the RIGHT femoral neck with sclerosis may be at risk for pathologic fracture. 3. Resection of large pelvic mass. No residual carcinoma evident within the pelvis. 4. Benign LEFT adrenal adenoma. 5. Diffuse mild  metabolic activity in the gastric fundus  and proximal body is nonspecific. Recommend correlation with recent upper endoscopy.     09/08/2020 -  Chemotherapy   First-line FOLFOX q2weeks starting 09/08/20. Held 09/30/20 to proceed with palliative radiation. Restarted 11/03/20    09/26/2020 Genetic Testing   Negative hereditary cancer genetic testing: no pathogenic variants detected in [facility] CustomNext-Cancer +RNAinsight Panel.  Variants of uncertain significance detected in LZTR1 at  p.K452E (c.1354A>G) and MLH1 at p.P747S (c.2239C>T).  The report date is September 21, 2020.   The CustomNext-Cancer +RNAinsight Panel offered by [facility] and includes sequencing and rearrangement analysis for the following 91 genes: AIP, ALK, APC, ATM, AXIN2, BAP1, BARD1, BLM, BMPR1A, BRCA1, BRCA2, BRIP1, CDC73, CDH1, CDK4, CDKN1B, CDKN2A, CHEK2, CTNNA1, DICER1, FANCC, FH, FLCN, GALNT12, KIF1B, LZTR1, MAX, MEN1, MET, MLH1, MRE11A, MSH2, MSH3, MSH6, MUTYH, NBN, NF1, NF2, NTHL1, PALB2, PHOX2B, PMS2, POT1, PRKAR1A, PTCH1, PTEN, RAD50, RAD51C, RAD51D, RB1, RECQL, RET, SDHA, SDHAF2, SDHB, SDHC, SDHD, SMAD4, SMARCA4, SMARCB1, SMARCE1, STK11, SUFU, TMEM127, TP53, TSC1, TSC2, VHL and XRCC2 (sequencing and deletion/duplication); CASR, CFTR, CPA1, CTRC, EGFR, EGLN1, FAM175A, HOXB13, KIT, MITF, MLH3, PALLD, PDGFRA, POLD1, POLE, PRSS1, RINT1, RPS20, SPINK1 and TERT (sequencing only); EPCAM and GREM1 (deletion/duplication only). RNA data is routinely analyzed for use in variant interpretation for all genes.   10/04/2020 - 10/26/2020 Radiation Therapy   Palliative Radiation to right hip/lumbar spine with Dr Lisbeth Renshaw   Site Technique Total Dose (Gy) Dose per Fx (Gy) Completed Fx Beam Energies  Femur Right: Ext_Rt Complex 37.5/37.5 2.5 15/15 15X  Pelvis: Pelvis_Left SI 3D 37.5/37.5 2.5 15/15 10X, 15X       CURRENT THERAPY:  First line chemo FOLFOX every 2 weeks  INTERVAL HISTORY:  Sheryl Pratt is here for a follow up of metastatic gastric adenocarcinoma. She was last seen by  me on 11/17/20. She was evaluated in the infusion area. She reports an increase in neuropathy to her feet. She notes it began about a week ago, causes a burning sensation, and can wake her up. She also notes it in her hands, but not as bad as her feet. She reports her bowel movements are okay. She notes she experiences constipation on day 3 or 4, which she relieves with prune juice. She reports pain to her right hip again, though she notes it is still improved from prior to radiation therapy. She states she mainly takes tylenol but has taken an oxycodone for the pain.   All other systems were reviewed with the patient and are negative.  MEDICAL HISTORY:  Past Medical History:  Diagnosis Date   Allergy    Arthritis    Bloating    Breast mass, right    Cancer ([place])    metastaic to bones and abd, May 2022   Constipation    Family history of gastric cancer 09/08/2020   Family history of ovarian cancer 09/08/2020   Family history of pancreatic cancer 09/08/2020   Family history of skin cancer 09/08/2020   Migraine    Pelvic mass    PONV (postoperative nausea and vomiting)    Seasonal allergies    Vitamin D deficiency     SURGICAL HISTORY: Past Surgical History:  Procedure Laterality Date   APPENDECTOMY     AUGMENTATION MAMMAPLASTY Bilateral    BREAST BIOPSY Right 2014   fibroadenoma   BREAST SURGERY  18 years ago   augmentation.   IR IMAGING GUIDED PORT INSERTION  09/06/2020   LAPAROTOMY  08/09/2020  Procedure: EXPLORATORY LAPAROTOMY, MASS EXCISION, PERITONEAL BIOPSIES;  Surgeon: [name], MD;  Location: [facility];  Service: Gynecology;;   OMENTECTOMY N/A 08/09/2020   Procedure: OMENTECTOMY;  Surgeon: [name], MD;  Location: [facility];  Service: Gynecology;  Laterality: N/A;   SALPINGOOPHORECTOMY Right 08/09/2020   Procedure: OPEN RIGHT SALPINGO OOPHORECTOMY;  Surgeon: [name], MD;  Location: [facility];  Service: Gynecology;  Laterality: Right;   VAGINAL  HYSTERECTOMY  2015    I have reviewed the social history and family history with the patient and they are unchanged from previous note.  ALLERGIES:  is allergic to codeine and wellbutrin [bupropion].  MEDICATIONS:  Current Outpatient Medications  Medication Sig Dispense Refill   gabapentin (NEURONTIN) 100 MG capsule Take 1-2 capsules (100-200 mg total) by mouth at bedtime. 60 capsule 0   acetaminophen (TYLENOL) 325 MG tablet Take by mouth every 6 (six) hours as needed for mild pain.     ALPRAZolam (XANAX) 0.25 MG tablet Take 1 tablet (0.25 mg total) by mouth 2 (two) times daily as needed for anxiety. 20 tablet 0   cholecalciferol (VITAMIN D3) 25 MCG (1000 UNIT) tablet Take 1,000 Units by mouth daily.     diphenhydrAMINE (BENADRYL) 25 mg capsule Take 25 mg by mouth at bedtime as needed for allergies.     ibuprofen (ADVIL) 800 MG tablet Take 1 tablet (800 mg total) by mouth every 8 (eight) hours as needed for moderate pain. For AFTER surgery only 30 tablet 0   Ibuprofen-diphenhydrAMINE HCl (IBUPROFEN PM) 200-25 MG CAPS Take 1 tablet by mouth at bedtime as needed (sleep).     ipratropium (ATROVENT) 0.03 % nasal spray Place 2 sprays into both nostrils every 12 (twelve) hours. 30 mL 0   lidocaine-prilocaine (EMLA) cream Apply to affected area once 30 g 3   Multiple Vitamin (MULTIVITAMIN) tablet Take 1 tablet by mouth daily.     ondansetron (ZOFRAN) 8 MG tablet Take 1 tablet (8 mg total) by mouth 2 (two) times daily as needed for refractory nausea / vomiting. Start on day 3 after chemotherapy. 30 tablet 1   oxyCODONE (OXY IR/ROXICODONE) 5 MG immediate release tablet Take 1 tablet (5 mg total) by mouth every 4 (four) hours as needed for severe pain. For severe pain only, do not take and drive 20 tablet 0   prochlorperazine (COMPAZINE) 10 MG tablet Take 1 tablet (10 mg total) by mouth every 6 (six) hours as needed (Nausea or vomiting). 30 tablet 1   Psyllium (METAMUCIL PO) Take 1 Dose by mouth daily.      senna-docusate (SENOKOT-S) 8.6-50 MG tablet Take 2 tablets by mouth at bedtime. Do not take if having diarrhea (Patient taking differently: Take 2 tablets by mouth daily. Do not take if having diarrhea) 60 tablet 0   Tetrahydrozoline HCl (VISINE OP) Place 1 drop into both eyes daily as needed (itching Daralene Milch).     traMADol (ULTRAM) 50 MG tablet Take 1 tablet (50 mg total) by mouth every 6 (six) hours as needed for severe pain. For AFTER surgery, do not take and drive 10 tablet 0   vitamin B-12 (CYANOCOBALAMIN) 1000 MCG tablet Take 1,000 mcg by mouth daily.     vitamin C (ASCORBIC ACID) 500 MG tablet Take 500 mg by mouth daily.     Vitamin D, Ergocalciferol, (DRISDOL) 1.25 MG (50000 UT) CAPS capsule Take one tablet wkly 12 capsule 3   vitamin E 180 MG (400 UNITS) capsule Take 400 Units by  mouth daily.     No current facility-administered medications for this visit.   Facility-Administered Medications Ordered in Other Visits  Medication Dose Route Frequency Provider Last Rate Last Admin   fluorouracil (ADRUCIL) 3,800 mg in sodium chloride 0.9 % 74 mL chemo infusion  2,400 mg/m2 (Treatment Plan Recorded) Intravenous 1 day or 1 dose [name], MD   3,800 mg at 12/01/20 1523   heparin lock flush 100 unit/mL  500 Units Intracatheter Once PRN [name], MD       sodium chloride flush (NS) 0.9 % injection 10 mL  10 mL Intracatheter PRN [name], MD        PHYSICAL EXAMINATION: ECOG PERFORMANCE STATUS: 1 - Symptomatic but completely ambulatory  There were no vitals filed for this visit. Wt Readings from Last 3 Encounters:  12/01/20 130 lb 8 oz (59.2 kg)  11/17/20 130 lb 8 oz (59.2 kg)  11/03/20 127 lb 4.8 oz (57.7 kg)    GENERAL:alert, no distress and comfortable SKIN: skin color, texture, turgor are normal, no rashes or significant lesions EYES: normal, Conjunctiva are pink and non-injected, sclera clear OROPHARYNX:no exudate, no erythema and lips, buccal mucosa, and tongue normal NEURO:  alert & oriented x 3 with fluent speech, no focal motor/sensory deficits  LABORATORY DATA:  I have reviewed the data as listed CBC Latest Ref Rng & Units 12/01/2020 11/17/2020 11/03/2020  WBC 4.0 - 10.5 K/uL 4.4 3.1(L) 3.7(L)  Hemoglobin 12.0 - 15.0 g/dL 12.6 11.4(L) 11.8(L)  Hematocrit 36.0 - 46.0 % 38.0 34.4(L) 35.2(L)  Platelets 150 - 400 K/uL 142(L) 148(L) 180     CMP Latest Ref Rng & Units 12/01/2020 11/17/2020 11/03/2020  Glucose 70 - 99 mg/dL 97 94 85  BUN 6 - 20 mg/dL _0 Creatinine 0.44 - 1.00 mg/dL 0.64 0.54 0.59  Sodium 135 - 145 mmol/L 141 140 141  Potassium 3.5 - 5.1 mmol/L 3.8 4.5 4.2  Chloride 98 - 111 mmol/L 105 108 108  CO2 22 - 32 mmol/L _1 Calcium 8.9 - 10.3 mg/dL 9.3 8.9 9.2  Total Protein 6.5 - 8.1 g/dL 7.0 6.7 6.9  Total Bilirubin 0.3 - 1.2 mg/dL 0.3 0.3 0.5  Alkaline Phos 38 - 126 U/L 163(H) 163(H) 153(H)  AST 15 - 41 U/L 34 40 20  ALT 0 - 44 U/L 39 49(H) 20      RADIOGRAPHIC STUDIES: I have personally reviewed the radiological images as listed and agreed with the findings in the report. No results found.   ASSESSMENT & PLAN:  Sheryl Pratt is a 55 y.o. female with   1. 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 Enlarged left adrenal gland.    -On 08/09/20, she underwent diagnostic laparoscopy with right salpingo-oophorectomy to assure the origin of the mass. The large ovarian mass showed adenocarcinoma with signet ring cell features, IHC studies show liver upper GI primary. Biopsy of other pelvic tissue was negative for metastasis. -She underwent EGD with Dr. Ardis Hughs on 08/26/20. Final pathology showed poorly differentiated adenofibroma with signet rings features. This is the same as her ovarian pathology.  -Her 08/29/20 PET showed  multifocal hypermetabolic skeletal metastasis, Hypermetabolic metastasis in the RIGHT femoral neck with sclerosis may be at risk for pathologic fracture, she received palliative RT  --FO results showed MSI  stable disease with low mutation rate, no targetable mutation. HER2(-). Her PD-L1 was negative, so she is not a candidate for immunotherapy as first-line. -I started her on first-line chemo with FOLFOX every 2 weeks on 09/08/20. Goal of therapy is palliative. Plan to scan her after 3 months treatment.  -Labs reviewed, adequate to proceed with C5. -plan to repeat staging scan after cycle 6   2. Neuropathy G1 -she is currently taking B12 -worsened within the last week (from 12/01/20), mainly in her feet. -I discussed gabapentin can help relieve her symptoms, especially at night. She would like to try.   2. Lower back pain secondary to bone mets, muscular/nerve pains -The patient will continue to take NSAIDs for pain control. If needed, she has a prescription for tramadol and oxycodone. -Improved with palliative radiation therapy to the right hip and left pelvis 09/30/20 through 10/26/20. -We discussed adding Zometa to her regimen. I reviewed the side effects and the need to f/u with dentistry. I recommended she see her dentist, whom she can talk to about the medication. I plan to begin this in 4 weeks, if she is agreeable.   3. Social Support -She and her husband bought a house in [place], where their children live. They plan to retire there.  -She reports anxiety related to her treatments. I prescribed Xanax on 11/17/20.   4. Family History of Cancer  -The patient's brother had pancreatic cancer, diagnosed at age 57. She notes unknown cancer in another family member. -Genetic testing done 09/08/20 was negative, with VUS in MLH1 and LZTR1.     PLAN: -proceed with C5 FOLFOX today -I prescribed gabapentin for her today. -labs, f/u, and C6 in 2 weeks -plan for restaging CT in 3-4 weeks -start  Zometa in 4 weeks    No problem-specific Assessment & Plan notes found for this encounter.   Orders Placed This Encounter  Procedures   CT CHEST ABDOMEN PELVIS W CONTRAST    Standing Status:   Future    Standing Expiration Date:   12/01/2021    Order Specific Question:   If indicated for the ordered procedure, I authorize the administration of contrast media per Radiology protocol    Answer:   Yes    Order Specific Question:   Is patient pregnant?    Answer:   No    Order Specific Question:   Preferred imaging location?    Answer:   [facility]    Order Specific Question:   Release to patient    Answer:   Immediate    Order Specific Question:   Is Oral Contrast requested for this exam?    Answer:   Yes, Per Radiology protocol    Order Specific Question:   Reason for Exam (SYMPTOM  OR DIAGNOSIS REQUIRED)    Answer:   restaging   NM Bone Scan Whole Body    Standing Status:   Future    Standing Expiration Date:   12/01/2021    Order Specific Question:   If indicated for the ordered procedure, I authorize the administration of a radiopharmaceutical per Radiology protocol    Answer:   Yes    Order Specific Question:   Is the patient pregnant?    Answer:   No    Order Specific Question:   Preferred imaging location?    Answer:   [facility]   All questions were answered. The patient knows to call the clinic with any problems, questions or concerns. No  barriers to learning was detected. The total time spent in the appointment was 30 minutes.     [name], MD 12/01/2020   I, Wilburn Mylar, am acting as scribe for [name], MD.   I have reviewed the above documentation for accuracy and completeness, and I agree with the above.

## 2020-12-03 ENCOUNTER — Inpatient Hospital Stay: Payer: 59

## 2020-12-03 ENCOUNTER — Other Ambulatory Visit: Payer: Self-pay

## 2020-12-03 VITALS — BP 93/61 | HR 69 | Temp 97.8°F | Resp 19

## 2020-12-03 DIAGNOSIS — C169 Malignant neoplasm of stomach, unspecified: Secondary | ICD-10-CM

## 2020-12-03 DIAGNOSIS — Z5111 Encounter for antineoplastic chemotherapy: Secondary | ICD-10-CM | POA: Diagnosis not present

## 2020-12-03 MED ORDER — HEPARIN SOD (PORK) LOCK FLUSH 100 UNIT/ML IV SOLN
500.0000 [IU] | Freq: Once | INTRAVENOUS | Status: AC | PRN
  Administered 2020-12-03: 500 [IU]
  Filled 2020-12-03: qty 5

## 2020-12-03 MED ORDER — SODIUM CHLORIDE 0.9% FLUSH
10.0000 mL | INTRAVENOUS | Status: DC | PRN
  Administered 2020-12-03: 10 mL
  Filled 2020-12-03: qty 10

## 2020-12-03 NOTE — Patient Instructions (Signed)
Implanted Port Home Guide An implanted port is a device that is placed under the skin. It is usually placed in the chest. The device can be used to give IV medicine, to take blood, or for dialysis. You may have an implanted port if: You need IV medicine that would be irritating to the small veins in your hands or arms. You need IV medicines, such as antibiotics, for a long period of time. You need IV nutrition for a long period of time. You need dialysis. When you have a port, your health care provider can choose to use the port instead of veins in your arms for these procedures. You may have fewer limitations when using a port than you would if you used other types of long-term IVs, and you will likely be able to return to normal activities afteryour incision heals. An implanted port has two main parts: Reservoir. The reservoir is the part where a needle is inserted to give medicines or draw blood. The reservoir is round. After it is placed, it appears as a small, raised area under your skin. Catheter. The catheter is a thin, flexible tube that connects the reservoir to a vein. Medicine that is inserted into the reservoir goes into the catheter and then into the vein. How is my port accessed? To access your port: A numbing cream may be placed on the skin over the port site. Your health care provider will put on a mask and sterile gloves. The skin over your port will be cleaned carefully with a germ-killing soap and allowed to dry. Your health care provider will gently pinch the port and insert a needle into it. Your health care provider will check for a blood return to make sure the port is in the vein and is not clogged. If your port needs to remain accessed to get medicine continuously (constant infusion), your health care provider will place a clear bandage (dressing) over the needle site. The dressing and needle will need to be changed every week, or as told by your health care provider. What  is flushing? Flushing helps keep the port from getting clogged. Follow instructions from your health care provider about how and when to flush the port. Ports are usually flushed with saline solution or a medicine called heparin. The need for flushing will depend on how the port is used: If the port is only used from time to time to give medicines or draw blood, the port may need to be flushed: Before and after medicines have been given. Before and after blood has been drawn. As part of routine maintenance. Flushing may be recommended every 4-6 weeks. If a constant infusion is running, the port may not need to be flushed. Throw away any syringes in a disposal container that is meant for sharp items (sharps container). You can buy a sharps container from a pharmacy, or you can make one by using an empty hard plastic bottle with a cover. How long will my port stay implanted? The port can stay in for as long as your health care provider thinks it is needed. When it is time for the port to come out, a surgery will be done to remove it. The surgery will be similar to the procedure that was done to putthe port in. Follow these instructions at home:  Flush your port as told by your health care provider. If you need an infusion over several days, follow instructions from your health care provider about how to take   care of your port site. Make sure you: Wash your hands with soap and water before you change your dressing. If soap and water are not available, use alcohol-based hand sanitizer. Change your dressing as told by your health care provider. Place any used dressings or infusion bags into a plastic bag. Throw that bag in the trash. Keep the dressing that covers the needle clean and dry. Do not get it wet. Do not use scissors or sharp objects near the tube. Keep the tube clamped, unless it is being used. Check your port site every day for signs of infection. Check for: Redness, swelling, or  pain. Fluid or blood. Pus or a bad smell. Protect the skin around the port site. Avoid wearing bra straps that rub or irritate the site. Protect the skin around your port from seat belts. Place a soft pad over your chest if needed. Bathe or shower as told by your health care provider. The site may get wet as long as you are not actively receiving an infusion. Return to your normal activities as told by your health care provider. Ask your health care provider what activities are safe for you. Carry a medical alert card or wear a medical alert bracelet at all times. This will let health care providers know that you have an implanted port in case of an emergency. Get help right away if: You have redness, swelling, or pain at the port site. You have fluid or blood coming from your port site. You have pus or a bad smell coming from the port site. You have a fever. Summary Implanted ports are usually placed in the chest for long-term IV access. Follow instructions from your health care provider about flushing the port and changing bandages (dressings). Take care of the area around your port by avoiding clothing that puts pressure on the area, and by watching for signs of infection. Protect the skin around your port from seat belts. Place a soft pad over your chest if needed. Get help right away if you have a fever or you have redness, swelling, pain, drainage, or a bad smell at the port site. This information is not intended to replace advice given to you by your health care provider. Make sure you discuss any questions you have with your healthcare provider. Document Revised: 08/24/2019 Document Reviewed: 08/24/2019 Elsevier Patient Education  2022 [employer]  

## 2020-12-05 ENCOUNTER — Ambulatory Visit
Admission: RE | Admit: 2020-12-05 | Discharge: 2020-12-05 | Disposition: A | Discharge status: Home / Self Care | Payer: 59 | Source: Ambulatory Visit | Attending: Radiation Oncology | Admitting: Radiation Oncology

## 2020-12-05 DIAGNOSIS — C169 Malignant neoplasm of stomach, unspecified: Secondary | ICD-10-CM | POA: Insufficient documentation

## 2020-12-05 NOTE — Progress Notes (Signed)
  Radiation Oncology         [phone] ________________________________  Name: Sheryl Pratt MRN: [id number]  Date of Service: 12/05/2020  DOB: [birth year]  Post Treatment Telephone Note  Diagnosis:   Stage IV gastric adenocarcinoma with signet ring features metastatic to the ovary and bones  Interval Since Last Radiation:  6 weeks   10/04/2020 through 10/26/2020 Site Technique Total Dose (Gy) Dose per Fx (Gy) Completed Fx Beam Energies  Femur Right: Ext_Rt Complex 37.5/37.5 2.5 15/15 15X  Pelvis: Pelvis_Left SI 3D 37.5/37.5 2.5 15/15 10X, 15X    Narrative:  The patient was contacted today for routine follow-up. During treatment she did very well with radiotherapy and did not have significant desquamation. She reports she has has improvement in her pain since radiation and is hoping for more relief as she's had some pain distal to her treatment site in the right thigh.  Impression/Plan: 1. Stage IV gastric adenocarcinoma with signet ring features metastatic to the ovary and bones. The patient has been doing well since completion of radiotherapy. We discussed that we would be happy to continue to follow her as needed, but she will also continue to follow up with Dr. Burr Medico in medical oncology. We discussed adding a PPI if she plans to take NSAIDs regularly.        Carola Rhine, PAC

## 2020-12-15 ENCOUNTER — Inpatient Hospital Stay: Payer: 59

## 2020-12-15 ENCOUNTER — Inpatient Hospital Stay: Payer: 59 | Admitting: Hematology

## 2020-12-15 ENCOUNTER — Encounter: Payer: Self-pay | Admitting: Hematology

## 2020-12-15 ENCOUNTER — Other Ambulatory Visit: Payer: Self-pay

## 2020-12-15 VITALS — BP 96/69 | HR 77 | Temp 98.4°F | Resp 18 | Ht 68.0 in | Wt 128.9 lb

## 2020-12-15 DIAGNOSIS — Z5111 Encounter for antineoplastic chemotherapy: Secondary | ICD-10-CM | POA: Diagnosis not present

## 2020-12-15 DIAGNOSIS — Z95828 Presence of other vascular implants and grafts: Secondary | ICD-10-CM

## 2020-12-15 DIAGNOSIS — C169 Malignant neoplasm of stomach, unspecified: Secondary | ICD-10-CM

## 2020-12-15 DIAGNOSIS — C801 Malignant (primary) neoplasm, unspecified: Secondary | ICD-10-CM

## 2020-12-15 DIAGNOSIS — C786 Secondary malignant neoplasm of retroperitoneum and peritoneum: Secondary | ICD-10-CM | POA: Diagnosis not present

## 2020-12-15 DIAGNOSIS — C7961 Secondary malignant neoplasm of right ovary: Secondary | ICD-10-CM

## 2020-12-15 LAB — CBC WITH DIFFERENTIAL (CANCER CENTER ONLY)
Abs Immature Granulocytes: 0.01 10*3/uL (ref 0.00–0.07)
Basophils Absolute: 0 10*3/uL (ref 0.0–0.1)
Basophils Relative: 0 %
Eosinophils Absolute: 0 10*3/uL (ref 0.0–0.5)
Eosinophils Relative: 1 %
HCT: 39.2 % (ref 36.0–46.0)
Hemoglobin: 13.1 g/dL (ref 12.0–15.0)
Immature Granulocytes: 0 %
Lymphocytes Relative: 22 %
Lymphs Abs: 1.1 10*3/uL (ref 0.7–4.0)
MCH: 33.2 pg (ref 26.0–34.0)
MCHC: 33.4 g/dL (ref 30.0–36.0)
MCV: 99.5 fL (ref 80.0–100.0)
Monocytes Absolute: 0.8 10*3/uL (ref 0.1–1.0)
Monocytes Relative: 17 %
Neutro Abs: 2.9 10*3/uL (ref 1.7–7.7)
Neutrophils Relative %: 60 %
Platelet Count: 158 10*3/uL (ref 150–400)
RBC: 3.94 MIL/uL (ref 3.87–5.11)
RDW: 17.1 % — ABNORMAL HIGH (ref 11.5–15.5)
WBC Count: 4.9 10*3/uL (ref 4.0–10.5)
nRBC: 0 % (ref 0.0–0.2)

## 2020-12-15 LAB — CMP (CANCER CENTER ONLY)
ALT: 44 U/L (ref 0–44)
AST: 46 U/L — ABNORMAL HIGH (ref 15–41)
Albumin: 3.6 g/dL (ref 3.5–5.0)
Alkaline Phosphatase: 181 U/L — ABNORMAL HIGH (ref 38–126)
Anion gap: 8 (ref 5–15)
BUN: 10 mg/dL (ref 6–20)
CO2: 24 mmol/L (ref 22–32)
Calcium: 9 mg/dL (ref 8.9–10.3)
Chloride: 106 mmol/L (ref 98–111)
Creatinine: 0.64 mg/dL (ref 0.44–1.00)
GFR, Estimated: >60 mL/min (ref >60)
Glucose, Bld: 80 mg/dL (ref 70–99)
Potassium: 4.8 mmol/L (ref 3.5–5.1)
Sodium: 138 mmol/L (ref 135–145)
Total Bilirubin: 0.3 mg/dL (ref 0.3–1.2)
Total Protein: 7.4 g/dL (ref 6.5–8.1)

## 2020-12-15 MED ORDER — PALONOSETRON HCL INJECTION 0.25 MG/5ML
0.2500 mg | Freq: Once | INTRAVENOUS | Status: AC
  Administered 2020-12-15: 0.25 mg via INTRAVENOUS
  Filled 2020-12-15: qty 5

## 2020-12-15 MED ORDER — SODIUM CHLORIDE 0.9 % IV SOLN
2400.0000 mg/m2 | INTRAVENOUS | Status: DC
  Administered 2020-12-15: 3800 mg via INTRAVENOUS
  Filled 2020-12-15: qty 76

## 2020-12-15 MED ORDER — DEXTROSE 5 % IV SOLN: Freq: Once | INTRAVENOUS | Status: AC

## 2020-12-15 MED ORDER — DEXAMETHASONE SODIUM PHOSPHATE 10 MG/ML IJ SOLN
5.0000 mg | Freq: Once | INTRAMUSCULAR | Status: AC
  Administered 2020-12-15: 5 mg via INTRAVENOUS
  Filled 2020-12-15: qty 1

## 2020-12-15 MED ORDER — OXALIPLATIN CHEMO INJECTION 100 MG/20ML
70.0000 mg/m2 | Freq: Once | INTRAVENOUS | Status: AC
  Administered 2020-12-15: 110 mg via INTRAVENOUS
  Filled 2020-12-15: qty 10

## 2020-12-15 MED ORDER — SODIUM CHLORIDE 0.9% FLUSH
10.0000 mL | Freq: Once | INTRAVENOUS | Status: AC
  Administered 2020-12-15: 10 mL

## 2020-12-15 MED ORDER — ZOLEDRONIC ACID 4 MG/100ML IV SOLN: 4.0000 mg | Freq: Once | INTRAVENOUS | Status: DC

## 2020-12-15 MED ORDER — LEUCOVORIN CALCIUM INJECTION 350 MG
400.0000 mg/m2 | Freq: Once | INTRAVENOUS | Status: AC
  Administered 2020-12-15: 636 mg via INTRAVENOUS
  Filled 2020-12-15: qty 31.8

## 2020-12-15 NOTE — Patient Instructions (Signed)
[facility] ONCOLOGY  Discharge Instructions: Thank you for choosing [facility] to provide your oncology and hematology care.   If you have a lab appointment with the [facility], please go directly to the [facility] and check in at the registration area.   Wear comfortable clothing and clothing appropriate for easy access to any Portacath or PICC line.   We strive to give you quality time with your provider. You may need to reschedule your appointment if you arrive late (15 or more minutes).  Arriving late affects you and other patients whose appointments are after yours.  Also, if you miss three or more appointments without notifying the office, you may be dismissed from the clinic at the provider's discretion.      For prescription refill requests, have your pharmacy contact our office and allow 72 hours for refills to be completed.    Today you received the following chemotherapy and/or immunotherapy agents : Oxaliplatin, Fluorouracil, Leucovorin      To help prevent nausea and vomiting after your treatment, we encourage you to take your nausea medication as directed.  BELOW ARE SYMPTOMS THAT SHOULD BE REPORTED IMMEDIATELY: *FEVER GREATER THAN 100.4 F (38 C) OR HIGHER *CHILLS OR SWEATING *NAUSEA AND VOMITING THAT IS NOT CONTROLLED WITH YOUR NAUSEA MEDICATION *UNUSUAL SHORTNESS OF BREATH *UNUSUAL BRUISING OR BLEEDING *URINARY PROBLEMS (pain or burning when urinating, or frequent urination) *BOWEL PROBLEMS (unusual diarrhea, constipation, pain near the anus) TENDERNESS IN MOUTH AND THROAT WITH OR WITHOUT PRESENCE OF ULCERS (sore throat, sores in mouth, or a toothache) UNUSUAL RASH, SWELLING OR PAIN  UNUSUAL VAGINAL DISCHARGE OR ITCHING   Items with * indicate a potential emergency and should be followed up as soon as possible or go to the Emergency Department if any problems should occur.  Please show the CHEMOTHERAPY ALERT CARD or  IMMUNOTHERAPY ALERT CARD at check-in to the Emergency Department and triage nurse.  Should you have questions after your visit or need to cancel or reschedule your appointment, please contact [facility]  Dept: [phone]  and follow the prompts.  Office hours are 8:00 a.m. to 4:30 p.m. Monday - Friday. Please note that voicemails left after 4:00 p.m. may not be returned until the following business day.  We are closed weekends and major holidays. You have access to a nurse at all times for urgent questions. Please call the main number to the clinic Dept: [phone] and follow the prompts.   For any non-urgent questions, you may also contact your provider using MyChart. We now offer e-Visits for anyone 11 and older to request care online for non-urgent symptoms. For details visit [email or web address].   Also download the MyChart app! Go to the app store, search "MyChart", open the app, select [facility], and log in with your MyChart username and password.  Due to Covid, a mask is required upon entering the hospital/clinic. If you do not have a mask, one will be given to you upon arrival. For doctor visits, patients may have 1 support person aged 78 or older with them. For treatment visits, patients cannot have anyone with them due to current Covid guidelines and our immunocompromised population.

## 2020-12-15 NOTE — Progress Notes (Signed)
Sheryl Pratt   [phone] [phone]   Clinic Follow up Note   Patient Care Team: Lorrene Reid, PA-C as PCP - General Christene Lye, MD (General Surgery) [facility], [place] [name], MD as Consulting Physician (Hematology and Oncology) [name], RN (Inactive) as Oncology Nurse Navigator [name], MD as Consulting Physician (Gynecologic Oncology)  Date of Service:  12/15/2020  CHIEF COMPLAINT: f/u of metastatic gastric adenocarcinoma  ASSESSMENT & PLAN:  Sheryl Pratt is a 55 y.o. female with   1. 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 Enlarged left adrenal gland.    -On 08/09/20, she underwent diagnostic laparoscopy with right salpingo-oophorectomy to assure the origin of the mass. The large ovarian mass showed adenocarcinoma with signet ring cell features, IHC studies show liver upper GI primary. Biopsy of other pelvic tissue was negative for metastasis. -She underwent EGD with Dr. Ardis Hughs on 08/26/20. Final pathology showed poorly differentiated adenofibroma with signet rings features. This is the same as her ovarian pathology.  -Her 08/29/20 PET showed multifocal hypermetabolic skeletal metastasis, Hypermetabolic metastasis in the RIGHT femoral neck with sclerosis may be at risk for pathologic fracture, she received palliative RT  --FO results showed MSI stable disease with low mutation rate, no targetable mutation. HER2(-). Her PD-L1 was negative, so she is not a candidate for immunotherapy as first-line. -I started her on first-line chemo with FOLFOX every 2 weeks on 09/08/20. Goal of therapy is palliative. Plan to scan her after 3 months treatment.  -Dose reduced with C5 due to developing  neuropathy. She tolerated well and had improved neuropathy with the gabapentin. Her counts have also recovered. -Labs reviewed, adequate to proceed with C6 at same reduced dose. -she is scheduled for restaging CT and bone scan on 12/28/20.    2. Neuropathy G1 -she is currently taking B12 -She was prescribed gabapentin on 12/01/20, which has provided relief.   3. Lower back pain secondary to bone mets, muscular/nerve pains -The patient will continue to take NSAIDs for pain control. If needed, she has a prescription for tramadol and oxycodone. -Improved with palliative radiation therapy to the right hip and left pelvis 09/30/20 through 10/26/20. -We previously discussed adding Zometa to her regimen. She has gotten approval from her dentist. We will start this in 2 weeks. I recommend she start taking calcium supplement.   4. Social Support -She and her husband bought a house in [place], where their children live. They plan to retire there.  -She reports anxiety related to her treatments. I prescribed Xanax on 11/17/20.   5. Family History of Cancer  -The patient's brother had pancreatic cancer, diagnosed at age 71. She notes unknown cancer in another family member. -Genetic testing done 09/08/20 was negative, with VUS in MLH1 and LZTR1.     PLAN: -proceed with same dose C6 FOLFOX today -restaging CT on 12/28/20 -labs, f/u, and C7 in 2 weeks -start Zometa today    No problem-specific Assessment & Plan notes found for this encounter.   SUMMARY OF ONCOLOGIC HISTORY: Oncology History Overview Note  Cancer Staging Gastric adenocarcinoma [facility]) Staging form: Stomach, AJCC 8th Edition - Clinical stage from 08/26/2020: Stage IVB (cTX, cN0, pM1) - Signed by [name], MD on 09/06/2020 Stage prefix: Initial diagnosis Total positive nodes: 0 Histologic grade (  G): G3 Histologic grading system: 3 grade system    Gastric adenocarcinoma ([place])  07/30/2020 Imaging   CT Abdomen and Pelvis with  Contrast  IMPRESSION: 1. Large hypervascular soft tissue mass occupies the pelvis, compressing the urinary bladder and GI structures. No definite connection of this mass is seen to the vaginal cuff. The ovaries are not identified. 2. Enlarged left adrenal gland. Given the presence of soft tissue mass in the pelvis, this may represent metastatic disease to the adrenal gland. 3. Further evaluation with MRI of the abdomen and pelvis may be considered. Surgical consultation is also advised. 4. Right lower pole nonobstructive nephrolithiasis. 5. Aortic atherosclerosis.     08/09/2020 Procedure   Exploratory laparotomy with right salpingo-oophorectomy, omentectomy washings, peritoneal biopsies, and omentectomy under the care of Dr. Berline Lopes    08/09/2020 Pathology Results   Clinical History: Pelvic mass (crm)      FINAL MICROSCOPIC DIAGNOSIS:   A. ADNEXAL MASS, RIGHT, SALPINGO OOPHORECTOMY:  - Adenocarcinoma with signet ring cell features.  - No ovarian surface involvement identified.  - Benign Fallopian tube.  - See comment.   The right adnexal mass is 17.8 cm in greatest dimension and is diffusely  involved by adenocarcinoma with diffuse signet ring cell features.  Immunohistochemistry shows positivity with cytokeratin 7 and cytokeratin  20.  The tumor is negative with estrogen receptor, GATA3, GCDFP, CD56,  chromogranin, synaptic ficin, calretinin, inhibin, Napsin A, TTF-1, WT1  and PAX 8.  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  Possible primary sites  include upper gastrointestinal tract and pancreas.   08/19/2020 Initial Diagnosis   Adenocarcinoma ([place])   08/26/2020 Procedure   Upper Endoscopy by Dr Ardis Hughs  IMPRESSION - There were no overtly neoplastic findings. - There was mild, non-specific gastritis and bulbar duodenitis. Both sites were biopsied given large pelvic mass pathology.   08/26/2020 Pathology  Results   Surgical [P], gastric antrum and gastric body bx - POORLY DIFFERENTIATED ADENOCARCINOMA WITH SIGNET RING FEATURES. - SEE MICROSCOPIC DESCRIPTION.    08/26/2020 Cancer Staging   Staging form: Stomach, AJCC 8th Edition - Clinical stage from 08/26/2020: Stage IVB (cTX, cN0, pM1) - Signed by [name], MD on 09/06/2020 Stage prefix: Initial diagnosis Total positive nodes: 0 Histologic grade (G): G3 Histologic grading system: 3 grade system   08/29/2020 PET scan   IMPRESSION: 1. Dominant finding is multifocal hypermetabolic skeletal metastasis. Lesions are subtly evident on CT with subtle sclerosis. Lesions include the pelvis, spine and ribs. 2. Hypermetabolic metastasis in the RIGHT femoral neck with sclerosis may be at risk for pathologic fracture. 3. Resection of large pelvic mass. No residual carcinoma evident within the pelvis. 4. Benign LEFT adrenal adenoma. 5. Diffuse mild metabolic activity in the gastric fundus and proximal body is nonspecific. Recommend correlation with recent upper endoscopy.     09/08/2020 -  Chemotherapy   First-line FOLFOX q2weeks starting 09/08/20. Held 09/30/20 to proceed with palliative radiation. Restarted 11/03/20    09/26/2020 Genetic Testing   Negative hereditary cancer genetic testing: no pathogenic variants detected in [facility] CustomNext-Cancer +RNAinsight Panel.  Variants of uncertain significance detected in LZTR1 at  p.K452E (c.1354A>G) and MLH1 at p.P747S (c.2239C>T).  The report date is September 21, 2020.   The CustomNext-Cancer +RNAinsight Panel offered by [facility] and includes sequencing and rearrangement analysis for the following 91 genes: AIP, ALK, APC, ATM, AXIN2, BAP1, BARD1, BLM, BMPR1A, BRCA1, BRCA2, BRIP1, CDC73, CDH1, CDK4, CDKN1B, CDKN2A, CHEK2,  CTNNA1, DICER1, FANCC, FH, FLCN, GALNT12, KIF1B, LZTR1, MAX, MEN1, MET, MLH1, MRE11A, MSH2, MSH3, MSH6, MUTYH, NBN, NF1, NF2, NTHL1, PALB2, PHOX2B, PMS2, POT1, PRKAR1A, PTCH1, PTEN, RAD50,  RAD51C, RAD51D, RB1, RECQL, RET, SDHA, SDHAF2, SDHB, SDHC, SDHD, SMAD4, SMARCA4, SMARCB1, SMARCE1, STK11, SUFU, TMEM127, TP53, TSC1, TSC2, VHL and XRCC2 (sequencing and deletion/duplication); CASR, CFTR, CPA1, CTRC, EGFR, EGLN1, FAM175A, HOXB13, KIT, MITF, MLH3, PALLD, PDGFRA, POLD1, POLE, PRSS1, RINT1, RPS20, SPINK1 and TERT (sequencing only); EPCAM and GREM1 (deletion/duplication only). RNA data is routinely analyzed for use in variant interpretation for all genes.   10/04/2020 - 10/26/2020 Radiation Therapy   Palliative Radiation to right hip/lumbar spine with Dr Lisbeth Renshaw   Site Technique Total Dose (Gy) Dose per Fx (Gy) Completed Fx Beam Energies  Femur Right: Ext_Rt Complex 37.5/37.5 2.5 15/15 15X  Pelvis: Pelvis_Left SI 3D 37.5/37.5 2.5 15/15 10X, 15X       CURRENT THERAPY:  First line chemo FOLFOX every 2 weeks  INTERVAL HISTORY:  Sheryl Pratt is here for a follow up of metastatic gastric adenocarcinoma. She was last seen by me on 12/01/20. She presents to the clinic alone. She reports the gabapentin helps her neuropathy, as well as helps her sleep. She reports her pain has moved to "the bottom of my butt" and requires medication at night for her to get comfortable. She notes she hasn't needed the Xanax yet.   All other systems were reviewed with the patient and are negative.  MEDICAL HISTORY:  Past Medical History:  Diagnosis Date   Allergy    Arthritis    Bloating    Breast mass, right    Cancer ([place])    metastaic to bones and abd, May 2022   Constipation    Family history of gastric cancer 09/08/2020   Family history of ovarian cancer 09/08/2020   Family history of pancreatic cancer 09/08/2020   Family history of skin cancer 09/08/2020   Migraine    Pelvic mass    PONV (postoperative nausea and vomiting)    Seasonal allergies    Vitamin D deficiency     SURGICAL HISTORY: Past Surgical History:  Procedure Laterality Date   APPENDECTOMY     AUGMENTATION MAMMAPLASTY  Bilateral    BREAST BIOPSY Right 2014   fibroadenoma   BREAST SURGERY  18 years ago   augmentation.   IR IMAGING GUIDED PORT INSERTION  09/06/2020   LAPAROTOMY  08/09/2020   Procedure: EXPLORATORY LAPAROTOMY, MASS EXCISION, PERITONEAL BIOPSIES;  Surgeon: [name], MD;  Location: [facility];  Service: Gynecology;;   OMENTECTOMY N/A 08/09/2020   Procedure: OMENTECTOMY;  Surgeon: [name], MD;  Location: [facility];  Service: Gynecology;  Laterality: N/A;   SALPINGOOPHORECTOMY Right 08/09/2020   Procedure: OPEN RIGHT SALPINGO OOPHORECTOMY;  Surgeon: [name], MD;  Location: [facility];  Service: Gynecology;  Laterality: Right;   VAGINAL HYSTERECTOMY  2015    I have reviewed the social history and family history with the patient and they are unchanged from previous note.  ALLERGIES:  is allergic to codeine and wellbutrin [bupropion].  MEDICATIONS:  Current Outpatient Medications  Medication Sig Dispense Refill   acetaminophen (TYLENOL) 325 MG tablet Take by mouth every 6 (six) hours as needed for mild pain.     ALPRAZolam (XANAX) 0.25 MG tablet Take 1 tablet (0.25 mg total) by mouth 2 (two) times daily as needed for anxiety. 20 tablet 0   cholecalciferol (VITAMIN D3) 25 MCG (1000 UNIT) tablet Take 1,000 Units  by mouth daily.     diphenhydrAMINE (BENADRYL) 25 mg capsule Take 25 mg by mouth at bedtime as needed for allergies.     gabapentin (NEURONTIN) 100 MG capsule Take 1-2 capsules (100-200 mg total) by mouth at bedtime. 60 capsule 0   ibuprofen (ADVIL) 800 MG tablet Take 1 tablet (800 mg total) by mouth every 8 (eight) hours as needed for moderate pain. For AFTER surgery only 30 tablet 0   Ibuprofen-diphenhydrAMINE HCl (IBUPROFEN PM) 200-25 MG CAPS Take 1 tablet by mouth at bedtime as needed (sleep).     ipratropium (ATROVENT) 0.03 % nasal spray Place 2 sprays into both nostrils every 12 (twelve) hours. 30 mL 0   lidocaine-prilocaine (EMLA) cream Apply to affected area  once 30 g 3   Multiple Vitamin (MULTIVITAMIN) tablet Take 1 tablet by mouth daily.     ondansetron (ZOFRAN) 8 MG tablet Take 1 tablet (8 mg total) by mouth 2 (two) times daily as needed for refractory nausea / vomiting. Start on day 3 after chemotherapy. 30 tablet 1   oxyCODONE (OXY IR/ROXICODONE) 5 MG immediate release tablet Take 1 tablet (5 mg total) by mouth every 4 (four) hours as needed for severe pain. For severe pain only, do not take and drive 20 tablet 0   prochlorperazine (COMPAZINE) 10 MG tablet Take 1 tablet (10 mg total) by mouth every 6 (six) hours as needed (Nausea or vomiting). 30 tablet 1   Psyllium (METAMUCIL PO) Take 1 Dose by mouth daily.     senna-docusate (SENOKOT-S) 8.6-50 MG tablet Take 2 tablets by mouth at bedtime. Do not take if having diarrhea (Patient taking differently: Take 2 tablets by mouth daily. Do not take if having diarrhea) 60 tablet 0   Tetrahydrozoline HCl (VISINE OP) Place 1 drop into both eyes daily as needed (itching Daralene Milch).     traMADol (ULTRAM) 50 MG tablet Take 1 tablet (50 mg total) by mouth every 6 (six) hours as needed for severe pain. For AFTER surgery, do not take and drive 10 tablet 0   vitamin B-12 (CYANOCOBALAMIN) 1000 MCG tablet Take 1,000 mcg by mouth daily.     vitamin C (ASCORBIC ACID) 500 MG tablet Take 500 mg by mouth daily.     Vitamin D, Ergocalciferol, (DRISDOL) 1.25 MG (50000 UT) CAPS capsule Take one tablet wkly 12 capsule 3   vitamin E 180 MG (400 UNITS) capsule Take 400 Units by mouth daily.     No current facility-administered medications for this visit.   Facility-Administered Medications Ordered in Other Visits  Medication Dose Route Frequency Provider Last Rate Last Admin   fluorouracil (ADRUCIL) 3,800 mg in sodium chloride 0.9 % 74 mL chemo infusion  2,400 mg/m2 (Treatment Plan Recorded) Intravenous 1 day or 1 dose [name], MD   3,800 mg at 12/15/20 1601    PHYSICAL EXAMINATION: ECOG PERFORMANCE STATUS: 1 -  Symptomatic but completely ambulatory  Vitals:   12/15/20 1122  BP: 96/69  Pulse: 77  Resp: 18  Temp: 98.4 F (36.9 C)  SpO2: 97%   Wt Readings from Last 3 Encounters:  12/15/20 128 lb 14.4 oz (58.5 kg)  12/01/20 130 lb 8 oz (59.2 kg)  11/17/20 130 lb 8 oz (59.2 kg)     GENERAL:alert, no distress and comfortable SKIN: skin color normal, no rashes or significant lesions EYES: normal, Conjunctiva are pink and non-injected, sclera clear  NEURO: alert & oriented x 3 with fluent speech  LABORATORY DATA:  I have reviewed  the data as listed CBC Latest Ref Rng & Units 12/15/2020 12/01/2020 11/17/2020  WBC 4.0 - 10.5 K/uL 4.9 4.4 3.1(L)  Hemoglobin 12.0 - 15.0 g/dL 13.1 12.6 11.4(L)  Hematocrit 36.0 - 46.0 % 39.2 38.0 34.4(L)  Platelets 150 - 400 K/uL 158 142(L) 148(L)     CMP Latest Ref Rng & Units 12/15/2020 12/01/2020 11/17/2020  Glucose 70 - 99 mg/dL 80 97 94  BUN 6 - 20 mg/dL _0 Creatinine 0.44 - 1.00 mg/dL 0.64 0.64 0.54  Sodium 135 - 145 mmol/L 138 141 140  Potassium 3.5 - 5.1 mmol/L 4.8 3.8 4.5  Chloride 98 - 111 mmol/L 106 105 108  CO2 22 - 32 mmol/L _1 Calcium 8.9 - 10.3 mg/dL 9.0 9.3 8.9  Total Protein 6.5 - 8.1 g/dL 7.4 7.0 6.7  Total Bilirubin 0.3 - 1.2 mg/dL 0.3 0.3 0.3  Alkaline Phos 38 - 126 U/L 181(H) 163(H) 163(H)  AST 15 - 41 U/L 46(H) 34 40  ALT 0 - 44 U/L 44 39 49(H)      RADIOGRAPHIC STUDIES: I have personally reviewed the radiological images as listed and agreed with the findings in the report. No results found.    No orders of the defined types were placed in this encounter.  All questions were answered. The patient knows to call the clinic with any problems, questions or concerns. No barriers to learning was detected. The total time spent in the appointment was 30 minutes.     [name], MD 12/15/2020   I, Wilburn Mylar, am acting as scribe for [name], MD.   I have reviewed the above documentation for accuracy and  completeness, and I agree with the above.

## 2020-12-17 ENCOUNTER — Other Ambulatory Visit: Payer: Self-pay

## 2020-12-17 ENCOUNTER — Inpatient Hospital Stay: Payer: 59

## 2020-12-17 VITALS — BP 96/66 | HR 73 | Temp 97.7°F | Resp 20

## 2020-12-17 DIAGNOSIS — Z5111 Encounter for antineoplastic chemotherapy: Secondary | ICD-10-CM | POA: Diagnosis not present

## 2020-12-17 DIAGNOSIS — C169 Malignant neoplasm of stomach, unspecified: Secondary | ICD-10-CM

## 2020-12-17 MED ORDER — SODIUM CHLORIDE 0.9% FLUSH
10.0000 mL | INTRAVENOUS | Status: DC | PRN
Start: 2020-12-17 — End: 2020-12-17
  Administered 2020-12-17: 10 mL

## 2020-12-17 MED ORDER — HEPARIN SOD (PORK) LOCK FLUSH 100 UNIT/ML IV SOLN
500.0000 [IU] | Freq: Once | INTRAVENOUS | Status: AC | PRN
  Administered 2020-12-17: 500 [IU]

## 2020-12-28 ENCOUNTER — Other Ambulatory Visit: Payer: Self-pay

## 2020-12-28 ENCOUNTER — Encounter (HOSPITAL_COMMUNITY)
Admission: RE | Admit: 2020-12-28 | Discharge: 2020-12-28 | Disposition: A | Discharge status: Home / Self Care | Payer: 59 | Source: Ambulatory Visit | Attending: Hematology | Admitting: Hematology

## 2020-12-28 ENCOUNTER — Ambulatory Visit (HOSPITAL_COMMUNITY)
Admission: RE | Admit: 2020-12-28 | Discharge: 2020-12-28 | Disposition: A | Discharge status: Home / Self Care | Payer: 59 | Source: Ambulatory Visit | Attending: Hematology | Admitting: Hematology

## 2020-12-28 DIAGNOSIS — C169 Malignant neoplasm of stomach, unspecified: Secondary | ICD-10-CM

## 2020-12-28 MED ORDER — TECHNETIUM TC 99M MEDRONATE IV KIT
21.6000 | PACK | Freq: Once | INTRAVENOUS | Status: AC
  Administered 2020-12-28: 21.6 via INTRAVENOUS

## 2020-12-28 MED ORDER — IOHEXOL 350 MG/ML SOLN
75.0000 mL | Freq: Once | INTRAVENOUS | Status: AC | PRN
  Administered 2020-12-28: 75 mL via INTRAVENOUS

## 2020-12-29 ENCOUNTER — Inpatient Hospital Stay: Payer: 59 | Attending: Gynecologic Oncology

## 2020-12-29 ENCOUNTER — Inpatient Hospital Stay: Payer: 59 | Admitting: Hematology

## 2020-12-29 ENCOUNTER — Inpatient Hospital Stay: Payer: 59

## 2020-12-29 VITALS — BP 97/62 | HR 69 | Temp 98.1°F | Resp 16 | Ht 68.0 in | Wt 130.5 lb

## 2020-12-29 DIAGNOSIS — Z5111 Encounter for antineoplastic chemotherapy: Secondary | ICD-10-CM | POA: Insufficient documentation

## 2020-12-29 DIAGNOSIS — C7961 Secondary malignant neoplasm of right ovary: Secondary | ICD-10-CM | POA: Insufficient documentation

## 2020-12-29 DIAGNOSIS — G629 Polyneuropathy, unspecified: Secondary | ICD-10-CM | POA: Insufficient documentation

## 2020-12-29 DIAGNOSIS — C169 Malignant neoplasm of stomach, unspecified: Secondary | ICD-10-CM | POA: Diagnosis present

## 2020-12-29 DIAGNOSIS — C7951 Secondary malignant neoplasm of bone: Secondary | ICD-10-CM | POA: Insufficient documentation

## 2020-12-29 DIAGNOSIS — Z95828 Presence of other vascular implants and grafts: Secondary | ICD-10-CM

## 2020-12-29 DIAGNOSIS — Z452 Encounter for adjustment and management of vascular access device: Secondary | ICD-10-CM | POA: Diagnosis not present

## 2020-12-29 DIAGNOSIS — C801 Malignant (primary) neoplasm, unspecified: Secondary | ICD-10-CM

## 2020-12-29 LAB — CBC WITH DIFFERENTIAL (CANCER CENTER ONLY)
Abs Immature Granulocytes: 0.02 10*3/uL (ref 0.00–0.07)
Basophils Absolute: 0 10*3/uL (ref 0.0–0.1)
Basophils Relative: 0 %
Eosinophils Absolute: 0.1 10*3/uL (ref 0.0–0.5)
Eosinophils Relative: 2 %
HCT: 36.9 % (ref 36.0–46.0)
Hemoglobin: 12.4 g/dL (ref 12.0–15.0)
Immature Granulocytes: 0 %
Lymphocytes Relative: 22 %
Lymphs Abs: 1 10*3/uL (ref 0.7–4.0)
MCH: 32.8 pg (ref 26.0–34.0)
MCHC: 33.6 g/dL (ref 30.0–36.0)
MCV: 97.6 fL (ref 80.0–100.0)
Monocytes Absolute: 0.9 10*3/uL (ref 0.1–1.0)
Monocytes Relative: 19 %
Neutro Abs: 2.6 10*3/uL (ref 1.7–7.7)
Neutrophils Relative %: 57 %
Platelet Count: 143 10*3/uL — ABNORMAL LOW (ref 150–400)
RBC: 3.78 MIL/uL — ABNORMAL LOW (ref 3.87–5.11)
RDW: 16.8 % — ABNORMAL HIGH (ref 11.5–15.5)
WBC Count: 4.6 10*3/uL (ref 4.0–10.5)
nRBC: 0 % (ref 0.0–0.2)

## 2020-12-29 LAB — CMP (CANCER CENTER ONLY)
ALT: 38 U/L (ref 0–44)
AST: 38 U/L (ref 15–41)
Albumin: 3.5 g/dL (ref 3.5–5.0)
Alkaline Phosphatase: 187 U/L — ABNORMAL HIGH (ref 38–126)
Anion gap: 10 (ref 5–15)
BUN: 11 mg/dL (ref 6–20)
CO2: 24 mmol/L (ref 22–32)
Calcium: 9.3 mg/dL (ref 8.9–10.3)
Chloride: 107 mmol/L (ref 98–111)
Creatinine: 0.64 mg/dL (ref 0.44–1.00)
GFR, Estimated: >60 mL/min (ref >60)
Glucose, Bld: 84 mg/dL (ref 70–99)
Potassium: 4.1 mmol/L (ref 3.5–5.1)
Sodium: 141 mmol/L (ref 135–145)
Total Bilirubin: 0.3 mg/dL (ref 0.3–1.2)
Total Protein: 6.9 g/dL (ref 6.5–8.1)

## 2020-12-29 MED ORDER — DEXTROSE 5 % IV SOLN
70.0000 mg/m2 | Freq: Once | INTRAVENOUS | Status: AC
  Administered 2020-12-29: 110 mg via INTRAVENOUS
  Filled 2020-12-29: qty 22

## 2020-12-29 MED ORDER — SODIUM CHLORIDE 0.9 % IV SOLN: Freq: Once | INTRAVENOUS | Status: AC

## 2020-12-29 MED ORDER — PALONOSETRON HCL INJECTION 0.25 MG/5ML
0.2500 mg | Freq: Once | INTRAVENOUS | Status: AC
  Administered 2020-12-29: 0.25 mg via INTRAVENOUS
  Filled 2020-12-29: qty 5

## 2020-12-29 MED ORDER — DEXAMETHASONE SODIUM PHOSPHATE 10 MG/ML IJ SOLN
5.0000 mg | Freq: Once | INTRAMUSCULAR | Status: AC
  Administered 2020-12-29: 5 mg via INTRAVENOUS
  Filled 2020-12-29: qty 1

## 2020-12-29 MED ORDER — SODIUM CHLORIDE 0.9% FLUSH
10.0000 mL | Freq: Once | INTRAVENOUS | Status: AC
  Administered 2020-12-29: 10 mL

## 2020-12-29 MED ORDER — DEXTROSE 5 % IV SOLN: Freq: Once | INTRAVENOUS | Status: AC

## 2020-12-29 MED ORDER — ZOLEDRONIC ACID 4 MG/100ML IV SOLN
4.0000 mg | Freq: Once | INTRAVENOUS | Status: AC
  Administered 2020-12-29: 4 mg via INTRAVENOUS
  Filled 2020-12-29: qty 100

## 2020-12-29 MED ORDER — DEXTROSE 5 % IV SOLN
400.0000 mg/m2 | Freq: Once | INTRAVENOUS | Status: AC
  Administered 2020-12-29: 636 mg via INTRAVENOUS
  Filled 2020-12-29: qty 31.8

## 2020-12-29 MED ORDER — SODIUM CHLORIDE 0.9 % IV SOLN
2400.0000 mg/m2 | INTRAVENOUS | Status: DC
  Administered 2020-12-29: 3800 mg via INTRAVENOUS
  Filled 2020-12-29: qty 76

## 2020-12-29 NOTE — Patient Instructions (Signed)
[facility]   Discharge Instructions: Thank you for choosing [facility] to provide your oncology and hematology care.   If you have a lab appointment with the [facility], please go directly to the [facility] and check in at the registration area.   Wear comfortable clothing and clothing appropriate for easy access to any Portacath or PICC line.   We strive to give you quality time with your provider. You may need to reschedule your appointment if you arrive late (15 or more minutes).  Arriving late affects you and other patients whose appointments are after yours.  Also, if you miss three or more appointments without notifying the office, you may be dismissed from the clinic at the provider's discretion.      For prescription refill requests, have your pharmacy contact our office and allow 72 hours for refills to be completed.    Today you received the following chemotherapy and/or immunotherapy agents: oxaliplatin, leucovorin, fluorouracil.      To help prevent nausea and vomiting after your treatment, we encourage you to take your nausea medication as directed.  BELOW ARE SYMPTOMS THAT SHOULD BE REPORTED IMMEDIATELY: *FEVER GREATER THAN 100.4 F (38 C) OR HIGHER *CHILLS OR SWEATING *NAUSEA AND VOMITING THAT IS NOT CONTROLLED WITH YOUR NAUSEA MEDICATION *UNUSUAL SHORTNESS OF BREATH *UNUSUAL BRUISING OR BLEEDING *URINARY PROBLEMS (pain or burning when urinating, or frequent urination) *BOWEL PROBLEMS (unusual diarrhea, constipation, pain near the anus) TENDERNESS IN MOUTH AND THROAT WITH OR WITHOUT PRESENCE OF ULCERS (sore throat, sores in mouth, or a toothache) UNUSUAL RASH, SWELLING OR PAIN  UNUSUAL VAGINAL DISCHARGE OR ITCHING   Items with * indicate a potential emergency and should be followed up as soon as possible or go to the Emergency Department if any problems should occur.  Please show the CHEMOTHERAPY ALERT CARD or  IMMUNOTHERAPY ALERT CARD at check-in to the Emergency Department and triage nurse.  Should you have questions after your visit or need to cancel or reschedule your appointment, please contact [address]  Dept: [phone]  and follow the prompts.  Office hours are 8:00 a.m. to 4:30 p.m. Monday - Friday. Please note that voicemails left after 4:00 p.m. may not be returned until the following business day.  We are closed weekends and major holidays. You have access to a nurse at all times for urgent questions. Please call the main number to the clinic Dept: [phone] and follow the prompts.   For any non-urgent questions, you may also contact your provider using MyChart. We now offer e-Visits for anyone 29 and older to request care online for non-urgent symptoms. For details visit [email or web address].   Also download the MyChart app! Go to the app store, search "MyChart", open the app, select [facility], and log in with your MyChart username and password.  Due to Covid, a mask is required upon entering the hospital/clinic. If you do not have a mask, one will be given to you upon arrival. For doctor visits, patients may have 1 support person aged 66 or older with them. For treatment visits, patients cannot have anyone with them due to current Covid guidelines and our immunocompromised population.     Zoledronic Acid Injection (Hypercalcemia, Oncology) What is this medication? ZOLEDRONIC ACID (ZOE le dron ik AS id) slows calcium loss from bones. It high calcium levels in the blood from some kinds of cancer. It may be used in other people at risk for  bone loss. This medicine may be used for other purposes; ask your health care provider or pharmacist if you have questions. COMMON BRAND NAME(S): Zometa What should I tell my care team before I take this medication? They need to know if you have any of these conditions: cancer dehydration dental disease kidney  disease liver disease low levels of calcium in the blood lung or breathing disease (asthma) receiving steroids like dexamethasone or prednisone an unusual or allergic reaction to zoledronic acid, other medicines, foods, dyes, or preservatives pregnant or trying to get pregnant breast-feeding How should I use this medication? This drug is injected into a vein. It is given by a health care provider in a hospital or clinic setting. Talk to your health care provider about the use of this drug in children. Special care may be needed. Overdosage: If you think you have taken too much of this medicine contact a poison control center or emergency room at once. NOTE: This medicine is only for you. Do not share this medicine with others. What if I miss a dose? Keep appointments for follow-up doses. It is important not to miss your dose. Call your health care provider if you are unable to keep an appointment. What may interact with this medication? certain antibiotics given by injection NSAIDs, medicines for pain and inflammation, like ibuprofen or naproxen some diuretics like bumetanide, furosemide teriparatide thalidomide This list may not describe all possible interactions. Give your health care provider a list of all the medicines, herbs, non-prescription drugs, or dietary supplements you use. Also tell them if you smoke, drink alcohol, or use illegal drugs. Some items may interact with your medicine. What should I watch for while using this medication? Visit your health care provider for regular checks on your progress. It may be some time before you see the benefit from this drug. Some people who take this drug have severe bone, joint, or muscle pain. This drug may also increase your risk for jaw problems or a broken thigh bone. Tell your health care provider right away if you have severe pain in your jaw, bones, joints, or muscles. Tell you health care provider if you have any pain that does not  go away or that gets worse. Tell your dentist and dental surgeon that you are taking this drug. You should not have major dental surgery while on this drug. See your dentist to have a dental exam and fix any dental problems before starting this drug. Take good care of your teeth while on this drug. Make sure you see your dentist for regular follow-up appointments. You should make sure you get enough calcium and vitamin D while you are taking this drug. Discuss the foods you eat and the vitamins you take with your health care provider. Check with your health care provider if you have severe diarrhea, nausea, and vomiting, or if you sweat a lot. The loss of too much body fluid may make it dangerous for you to take this drug. You may need blood work done while you are taking this drug. Do not become pregnant while taking this drug. Women should inform their health care provider if they wish to become pregnant or think they might be pregnant. There is potential for serious harm to an unborn child. Talk to your health care provider for more information. What side effects may I notice from receiving this medication? Side effects that you should report to your doctor or health care provider as soon as possible: allergic reactions (  skin rash, itching or hives; swelling of the face, lips, or tongue) bone pain infection (fever, chills, cough, sore throat, pain or trouble passing urine) jaw pain, especially after dental work joint pain kidney injury (trouble passing urine or change in the amount of urine) low blood pressure (dizziness; feeling faint or lightheaded, falls; unusually weak or tired) low calcium levels (fast heartbeat; muscle cramps or pain; pain, tingling, or numbness in the hands or feet; seizures) low magnesium levels (fast, irregular heartbeat; muscle cramp or pain; muscle weakness; tremors; seizures) low red blood cell counts (trouble breathing; feeling faint; lightheaded, falls; unusually  weak or tired) muscle pain redness, blistering, peeling, or loosening of the skin, including inside the mouth severe diarrhea swelling of the ankles, feet, hands trouble breathing Side effects that usually do not require medical attention (report to your doctor or health care provider if they continue or are bothersome): anxious constipation coughing depressed mood eye irritation, itching, or pain fever general ill feeling or flu-like symptoms nausea pain, redness, or irritation at site where injected trouble sleeping This list may not describe all possible side effects. Call your doctor for medical advice about side effects. You may report side effects to FDA at 1-800-FDA-1088. Where should I keep my medication? This drug is given in a hospital or clinic. It will not be stored at home. NOTE: This sheet is a summary. It may not cover all possible information. If you have questions about this medicine, talk to your doctor, pharmacist, or health care provider.  2022 [employer] Standard (2019-01-22 09:13:00)

## 2020-12-29 NOTE — Progress Notes (Signed)
[place]   Telephone:([phone] Fax:(336) [phone]   Clinic Follow up Note   Patient Care Team: Lorrene Reid, PA-C as PCP - General Christene Lye, MD (General Surgery) [facility] [place], [place] [name], MD as Consulting Physician (Hematology and Oncology) [name], RN (Inactive) as Oncology Nurse Navigator [name], MD as Consulting Physician (Gynecologic Oncology)  Date of Service:  12/29/2020  CHIEF COMPLAINT: f/u of metastatic gastric adenocarcinoma  CURRENT THERAPY:  First line chemo FOLFOX every 2 weeks  ASSESSMENT & PLAN:  Sheryl Pratt is a 55 y.o. female with   1. 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 Enlarged left adrenal gland.    -On 08/09/20, she underwent diagnostic laparoscopy with right salpingo-oophorectomy to assure the origin of the mass. The large ovarian mass showed adenocarcinoma with signet ring cell features, IHC studies show liver upper GI primary. Biopsy of other pelvic tissue was negative for metastasis. -She underwent EGD with Dr. Ardis Hughs on 08/26/20. Final pathology showed poorly differentiated adenofibroma with signet rings features. This is the same as her ovarian pathology.  --FO results showed MSI stable disease with low mutation rate, no targetable mutation. HER2(-). Her PD-L1 was negative, so she is not a candidate for immunotherapy as first-line. -I started her on first-line chemo with FOLFOX every 2 weeks on 09/08/20. Dose reduced with C5 due to developing neuropathy. She tolerated well and had improved neuropathy with the gabapentin. Her counts have also recovered. -restaging CT CAP and bone scan on 12/28/20 showed probable improvement to skeletal lesions (more scLerotic lesions on  CT). I reviewed the results with her, and also discussed the dilema between comparing these with her prior PET scan. -we will continue her current treatment and eventually move to Xeloda. -Labs reviewed, adequate to proceed with C7 at same reduced dose.   2. Neuropathy G1 -she is currently taking B12 -She was prescribed gabapentin on 12/01/20, which has provided relief.  3. Pulmonary nodules -first seen on CT CAP 12/2020, most of which are sub-solid -she has a history of smoking, and only smokes an occasional cigarette now. -will monitor on future scans   4. Lower back pain secondary to bone mets, muscular/nerve pains -The patient will continue to take NSAIDs for pain control. If needed, she has a prescription for tramadol and oxycodone. -Improved with palliative radiation therapy to the right hip and left pelvis 09/30/20 through 10/26/20. -We previously discussed adding Zometa to her regimen. She has gotten approval from her dentist.    5. Social Support -She and her husband bought a house in [place], where their children live. They plan to retire there.  -She reports anxiety related to her treatments. I prescribed Xanax on 11/17/20.   6. Family History of Cancer  -The patient's brother had pancreatic cancer, diagnosed at age 57. She notes unknown cancer in another family member. -Genetic testing done 09/08/20 was negative, with VUS in MLH1 and LZTR1.     PLAN: -Scan reviewed  -proceed with same dose C7 FOLFOX today -labs, f/u, and C8 in 2 weeks -start Zometa today     No problem-specific Assessment & Plan notes found for this encounter.   SUMMARY OF ONCOLOGIC HISTORY: Oncology History Overview Note  Cancer Staging Gastric adenocarcinoma [facility]) Staging form: Stomach, AJCC 8th Edition - Clinical stage  from 08/26/2020: Stage IVB (cTX, cN0, pM1) - Signed by [name], MD on 09/06/2020 Stage prefix: Initial diagnosis Total positive nodes: 0 Histologic grade (G): G3 Histologic  grading system: 3 grade system    Gastric adenocarcinoma ([place])  07/30/2020 Imaging   CT Abdomen and Pelvis with Contrast  IMPRESSION: 1. Large hypervascular soft tissue mass occupies the pelvis, compressing the urinary bladder and GI structures. No definite connection of this mass is seen to the vaginal cuff. The ovaries are not identified. 2. Enlarged left adrenal gland. Given the presence of soft tissue mass in the pelvis, this may represent metastatic disease to the adrenal gland. 3. Further evaluation with MRI of the abdomen and pelvis may be considered. Surgical consultation is also advised. 4. Right lower pole nonobstructive nephrolithiasis. 5. Aortic atherosclerosis.     08/09/2020 Procedure   Exploratory laparotomy with right salpingo-oophorectomy, omentectomy washings, peritoneal biopsies, and omentectomy under the care of Dr. Berline Lopes    08/09/2020 Pathology Results   FINAL MICROSCOPIC DIAGNOSIS:   A. ADNEXAL MASS, RIGHT, SALPINGO OOPHORECTOMY:  - Adenocarcinoma with signet ring cell features.  - No ovarian surface involvement identified.  - Benign Fallopian tube.  - See comment.   The right adnexal mass is 17.8 cm in greatest dimension and is diffusely involved by adenocarcinoma with diffuse signet ring cell features. Immunohistochemistry shows positivity with cytokeratin 7 and cytokeratin 20.  The tumor is negative with estrogen receptor, GATA3, GCDFP, CD56, chromogranin, synaptic ficin, calretinin, inhibin, Napsin A, TTF-1, WT1 and PAX 8.  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  Possible primary sites  include upper gastrointestinal tract and pancreas.   08/19/2020 Initial Diagnosis   Adenocarcinoma ([place])   08/26/2020 Procedure   Upper Endoscopy by Dr Ardis Hughs  IMPRESSION - There were no overtly neoplastic findings. - There was mild, non-specific gastritis and bulbar duodenitis. Both sites were biopsied  given large pelvic mass pathology.   08/26/2020 Pathology Results   Surgical [P], gastric antrum and gastric body bx - POORLY DIFFERENTIATED ADENOCARCINOMA WITH SIGNET RING FEATURES. - SEE MICROSCOPIC DESCRIPTION.    08/26/2020 Cancer Staging   Staging form: Stomach, AJCC 8th Edition - Clinical stage from 08/26/2020: Stage IVB (cTX, cN0, pM1) - Signed by [name], MD on 09/06/2020 Stage prefix: Initial diagnosis Total positive nodes: 0 Histologic grade (G): G3 Histologic grading system: 3 grade system   08/29/2020 PET scan   IMPRESSION: 1. Dominant finding is multifocal hypermetabolic skeletal metastasis. Lesions are subtly evident on CT with subtle sclerosis. Lesions include the pelvis, spine and ribs. 2. Hypermetabolic metastasis in the RIGHT femoral neck with sclerosis may be at risk for pathologic fracture. 3. Resection of large pelvic mass. No residual carcinoma evident within the pelvis. 4. Benign LEFT adrenal adenoma. 5. Diffuse mild metabolic activity in the gastric fundus and proximal body is nonspecific. Recommend correlation with recent upper endoscopy.     09/08/2020 -  Chemotherapy   First-line FOLFOX q2weeks starting 09/08/20. Held 09/30/20 to proceed with palliative radiation. Restarted 11/03/20    09/26/2020 Genetic Testing   Negative hereditary cancer genetic testing: no pathogenic variants detected in [facility] CustomNext-Cancer +RNAinsight Panel.  Variants of uncertain significance detected in LZTR1 at  p.K452E (c.1354A>G) and MLH1 at p.P747S (c.2239C>T).  The report date is September 21, 2020.   The CustomNext-Cancer +RNAinsight Panel offered by [facility] and includes sequencing and rearrangement analysis for the following 91 genes: AIP, ALK, APC, ATM, AXIN2, BAP1, BARD1, BLM, BMPR1A,  BRCA1, BRCA2, BRIP1, CDC73, CDH1, CDK4, CDKN1B, CDKN2A, CHEK2, CTNNA1, DICER1, FANCC, FH, FLCN, GALNT12, KIF1B, LZTR1, MAX, MEN1, MET, MLH1, MRE11A, MSH2, MSH3, MSH6, MUTYH, NBN, NF1, NF2, NTHL1,  PALB2, PHOX2B, PMS2, POT1, PRKAR1A, PTCH1, PTEN, RAD50, RAD51C, RAD51D, RB1, RECQL, RET, SDHA, SDHAF2, SDHB, SDHC, SDHD, SMAD4, SMARCA4, SMARCB1, SMARCE1, STK11, SUFU, TMEM127, TP53, TSC1, TSC2, VHL and XRCC2 (sequencing and deletion/duplication); CASR, CFTR, CPA1, CTRC, EGFR, EGLN1, FAM175A, HOXB13, KIT, MITF, MLH3, PALLD, PDGFRA, POLD1, POLE, PRSS1, RINT1, RPS20, SPINK1 and TERT (sequencing only); EPCAM and GREM1 (deletion/duplication only). RNA data is routinely analyzed for use in variant interpretation for all genes.   10/04/2020 - 10/26/2020 Radiation Therapy   Palliative Radiation to right hip/lumbar spine with Dr Lisbeth Renshaw   Site Technique Total Dose (Gy) Dose per Fx (Gy) Completed Fx Beam Energies  Femur Right: Ext_Rt Complex 37.5/37.5 2.5 15/15 15X  Pelvis: Pelvis_Left SI 3D 37.5/37.5 2.5 15/15 10X, 15X    12/28/2020 Imaging   CT CAP  IMPRESSION: 1. Extensive and diffuse sclerotic metastatic bone disease likely reflecting healing changes (lesions were largely lytic on the prior PET-CT). No pathologic fractures or spinal canal compromise. 2. No findings for abdominal/pelvic metastatic disease. 3. Numerous bilateral subsolid/ground-glass pulmonary nodules will require attention on future scans. 4. Stable left adrenal gland adenoma.   Aortic Atherosclerosis (ICD10-I70.0) and Emphysema (ICD10-J43.9).   12/28/2020 Imaging   Bone Scan  IMPRESSION: Multifocal radiotracer accumulation in the axial and appendicular skeleton consistent with metastatic disease. Relatively diffuse uptake is identified in the right femoral neck, compatible with the sclerotic lesion seen on the 12/28/2020 CT exam.      INTERVAL HISTORY:  Sheryl Pratt is here for a follow up of metastatic gastric cancer. She was last seen by me on 12/15/20. She presents to the clinic alone. She reports her neuropathy is improved on the gabapentin. She notes continued tingling to her fingers. She reports she had stomach issues  yesterday following her scan.   All other systems were reviewed with the patient and are negative.  MEDICAL HISTORY:  Past Medical History:  Diagnosis Date   Allergy    Arthritis    Bloating    Breast mass, right    Cancer ([place])    metastaic to bones and abd, May 2022   Constipation    Family history of gastric cancer 09/08/2020   Family history of ovarian cancer 09/08/2020   Family history of pancreatic cancer 09/08/2020   Family history of skin cancer 09/08/2020   Migraine    Pelvic mass    PONV (postoperative nausea and vomiting)    Seasonal allergies    Vitamin D deficiency     SURGICAL HISTORY: Past Surgical History:  Procedure Laterality Date   APPENDECTOMY     AUGMENTATION MAMMAPLASTY Bilateral    BREAST BIOPSY Right 2014   fibroadenoma   BREAST SURGERY  18 years ago   augmentation.   IR IMAGING GUIDED PORT INSERTION  09/06/2020   LAPAROTOMY  08/09/2020   Procedure: EXPLORATORY LAPAROTOMY, MASS EXCISION, PERITONEAL BIOPSIES;  Surgeon: [name], MD;  Location: [facility];  Service: Gynecology;;   OMENTECTOMY N/A 08/09/2020   Procedure: OMENTECTOMY;  Surgeon: [name], MD;  Location: [facility];  Service: Gynecology;  Laterality: N/A;   SALPINGOOPHORECTOMY Right 08/09/2020   Procedure: OPEN RIGHT SALPINGO OOPHORECTOMY;  Surgeon: [name], MD;  Location: [facility];  Service: Gynecology;  Laterality: Right;   VAGINAL HYSTERECTOMY  2015    I have reviewed the social  history and family history with the patient and they are unchanged from previous note.  ALLERGIES:  is allergic to codeine and wellbutrin [bupropion].  MEDICATIONS:  Current Outpatient Medications  Medication Sig Dispense Refill   acetaminophen (TYLENOL) 325 MG tablet Take by mouth every 6 (six) hours as needed for mild pain.     ALPRAZolam (XANAX) 0.25 MG tablet Take 1 tablet (0.25 mg total) by mouth 2 (two) times daily as needed for anxiety. 20 tablet 0   cholecalciferol (VITAMIN D3)  25 MCG (1000 UNIT) tablet Take 1,000 Units by mouth daily.     diphenhydrAMINE (BENADRYL) 25 mg capsule Take 25 mg by mouth at bedtime as needed for allergies.     gabapentin (NEURONTIN) 100 MG capsule Take 1-2 capsules (100-200 mg total) by mouth at bedtime. 60 capsule 0   ibuprofen (ADVIL) 800 MG tablet Take 1 tablet (800 mg total) by mouth every 8 (eight) hours as needed for moderate pain. For AFTER surgery only 30 tablet 0   Ibuprofen-diphenhydrAMINE HCl (IBUPROFEN PM) 200-25 MG CAPS Take 1 tablet by mouth at bedtime as needed (sleep).     ipratropium (ATROVENT) 0.03 % nasal spray Place 2 sprays into both nostrils every 12 (twelve) hours. 30 mL 0   lidocaine-prilocaine (EMLA) cream Apply to affected area once 30 g 3   Multiple Vitamin (MULTIVITAMIN) tablet Take 1 tablet by mouth daily.     ondansetron (ZOFRAN) 8 MG tablet Take 1 tablet (8 mg total) by mouth 2 (two) times daily as needed for refractory nausea / vomiting. Start on day 3 after chemotherapy. 30 tablet 1   oxyCODONE (OXY IR/ROXICODONE) 5 MG immediate release tablet Take 1 tablet (5 mg total) by mouth every 4 (four) hours as needed for severe pain. For severe pain only, do not take and drive 20 tablet 0   prochlorperazine (COMPAZINE) 10 MG tablet Take 1 tablet (10 mg total) by mouth every 6 (six) hours as needed (Nausea or vomiting). 30 tablet 1   Psyllium (METAMUCIL PO) Take 1 Dose by mouth daily.     senna-docusate (SENOKOT-S) 8.6-50 MG tablet Take 2 tablets by mouth at bedtime. Do not take if having diarrhea (Patient taking differently: Take 2 tablets by mouth daily. Do not take if having diarrhea) 60 tablet 0   Tetrahydrozoline HCl (VISINE OP) Place 1 drop into both eyes daily as needed (itching Daralene Milch).     traMADol (ULTRAM) 50 MG tablet Take 1 tablet (50 mg total) by mouth every 6 (six) hours as needed for severe pain. For AFTER surgery, do not take and drive 10 tablet 0   vitamin B-12 (CYANOCOBALAMIN) 1000 MCG tablet Take 1,000  mcg by mouth daily.     vitamin C (ASCORBIC ACID) 500 MG tablet Take 500 mg by mouth daily.     Vitamin D, Ergocalciferol, (DRISDOL) 1.25 MG (50000 UT) CAPS capsule Take one tablet wkly 12 capsule 3   vitamin E 180 MG (400 UNITS) capsule Take 400 Units by mouth daily.     No current facility-administered medications for this visit.   Facility-Administered Medications Ordered in Other Visits  Medication Dose Route Frequency Provider Last Rate Last Admin   dextrose 5 % solution   Intravenous Once [name], MD       fluorouracil (ADRUCIL) 3,800 mg in sodium chloride 0.9 % 74 mL chemo infusion  2,400 mg/m2 (Treatment Plan Recorded) Intravenous 1 day or 1 dose [name], MD       leucovorin 636 mg  in dextrose 5 % 250 mL infusion  400 mg/m2 (Treatment Plan Recorded) Intravenous Once [name], MD       oxaliplatin (ELOXATIN) 110 mg in dextrose 5 % 500 mL chemo infusion  70 mg/m2 (Treatment Plan Recorded) Intravenous Once [name], MD        PHYSICAL EXAMINATION: ECOG PERFORMANCE STATUS: 1 - Symptomatic but completely ambulatory  Vitals:   12/29/20 1153  BP: 97/62  Pulse: 69  Resp: 16  Temp: 98.1 F (36.7 C)  SpO2: 97%   Wt Readings from Last 3 Encounters:  12/29/20 130 lb 8 oz (59.2 kg)  12/15/20 128 lb 14.4 oz (58.5 kg)  12/01/20 130 lb 8 oz (59.2 kg)     GENERAL:alert, no distress and comfortable SKIN: skin color normal, no rashes or significant lesions EYES: normal, Conjunctiva are pink and non-injected, sclera clear  NEURO: alert & oriented x 3 with fluent speech  LABORATORY DATA:  I have reviewed the data as listed CBC Latest Ref Rng & Units 12/29/2020 12/15/2020 12/01/2020  WBC 4.0 - 10.5 K/uL 4.6 4.9 4.4  Hemoglobin 12.0 - 15.0 g/dL 12.4 13.1 12.6  Hematocrit 36.0 - 46.0 % 36.9 39.2 38.0  Platelets 150 - 400 K/uL 143(L) 158 142(L)     CMP Latest Ref Rng & Units 12/29/2020 12/15/2020 12/01/2020  Glucose 70 - 99 mg/dL 84 80 97  BUN 6 - 20 mg/dL _0 Creatinine 0.44 -  1.00 mg/dL 0.64 0.64 0.64  Sodium 135 - 145 mmol/L 141 138 141  Potassium 3.5 - 5.1 mmol/L 4.1 4.8 3.8  Chloride 98 - 111 mmol/L 107 106 105  CO2 22 - 32 mmol/L _1 Calcium 8.9 - 10.3 mg/dL 9.3 9.0 9.3  Total Protein 6.5 - 8.1 g/dL 6.9 7.4 7.0  Total Bilirubin 0.3 - 1.2 mg/dL 0.3 0.3 0.3  Alkaline Phos 38 - 126 U/L 187(H) 181(H) 163(H)  AST 15 - 41 U/L 38 46(H) 34  ALT 0 - 44 U/L 38 44 39      RADIOGRAPHIC STUDIES: I have personally reviewed the radiological images as listed and agreed with the findings in the report. NM Bone Scan Whole Body  Result Date: 12/29/2020 CLINICAL DATA:  Gastroesophageal cancer. EXAM: NUCLEAR MEDICINE WHOLE BODY BONE SCAN TECHNIQUE: Whole body anterior and posterior images were obtained approximately 3 hours after intravenous injection of radiopharmaceutical. RADIOPHARMACEUTICALS:  21.6 mCi Technetium-71mMDP IV COMPARISON:  PET-CT 08/29/2020 FINDINGS: Multifocal radiotracer accumulation identified on the current study consistent with metastatic disease. Involvement noted in the right shoulder cervical, thoracic, and lumbar spine and bilateral ribs. Sternal involvement noted. There is abnormal uptake in the sacrum bilaterally, right iliac bone, both acetabuli, in the right femoral neck. Scattered uptake identified in the right femur. IMPRESSION: Multifocal radiotracer accumulation in the axial and appendicular skeleton consistent with metastatic disease. Relatively diffuse uptake is identified in the right femoral neck, compatible with the sclerotic lesion seen on the 12/28/2020 CT exam. Electronically Signed   By: EMisty StanleyM.D.   On: 12/29/2020 10:41   CT CHEST ABDOMEN PELVIS W CONTRAST  Result Date: 12/29/2020 CLINICAL DATA:  Restaging metastatic gastric adenocarcinoma. EXAM: CT CHEST, ABDOMEN, AND PELVIS WITH CONTRAST TECHNIQUE: Multidetector CT imaging of the chest, abdomen and pelvis was performed following the standard protocol during bolus  administration of intravenous contrast. CONTRAST:  713mOMNIPAQUE IOHEXOL 350 MG/ML SOLN COMPARISON:  PET-CT 08/29/2020 FINDINGS: CT CHEST FINDINGS Cardiovascular: The heart is normal in size. No pericardial  effusion. The aorta is normal in caliber. No dissection. The branch vessels are patent. Stable coronary artery calcifications. The right IJ power port is in good position without complicating features. Mediastinum/Nodes: No mediastinal or hilar mass or adenopathy. The esophagus is grossly normal. Lungs/Pleura: No acute pulmonary findings. Mild emphysematous changes are noted. There are few small scattered pulmonary nodules more difficult to see on the prior PET-CT due to respiratory motion. Most of these are sub solid and will require follow-up. The largest ground-glass opacity is in the left upper lobe on image number 57/4 and measures 13.5 mm. 5 mm sub solid nodule in the right upper lobe on image number 48/4. 6.5 mm ground-glass nodule in the superior segment of the right lower lobe on image number 65/4 was present on the prior PET-CT. Musculoskeletal: Extensive and diffuse sclerotic metastatic bone lesions. These were predominantly lytic on the prior PET-CT and this would suggest healing changes. I do not see any pathologic fractures or spinal canal compromise. CT ABDOMEN PELVIS FINDINGS Hepatobiliary: No hepatic lesions to suggest metastatic disease. No peritoneal surface lesions. No intrahepatic biliary dilatation. Gallbladder is unremarkable. No common bile duct dilatation. Pancreas: No mass, inflammation or ductal dilatation. Spleen: Normal size. No focal lesions. Adrenals/Urinary Tract: Stable low-attenuation adrenal gland lesion consistent with benign adenoma. No renal lesions. Stable right lower pole renal calculus. The bladder is unremarkable. Stomach/Bowel: No obvious gastric lesion or gastric wall thickening. The duodenum, small bowel and colon are unremarkable. No mass lesions or obstructive  findings. Vascular/Lymphatic: The aorta and branch vessels are patent. Stable atherosclerotic calcifications involving the distal aorta and iliac arteries. The major venous structures are patent. No mesenteric or retroperitoneal mass or adenopathy. No pelvic adenopathy. Reproductive: Surgically absent. Other: No pelvic mass or adenopathy. No free pelvic fluid collections. No inguinal mass or adenopathy. No abdominal wall hernia or subcutaneous lesions. Musculoskeletal: Extensive sclerotic metastatic bone disease likely reflecting healing changes. IMPRESSION: 1. Extensive and diffuse sclerotic metastatic bone disease likely reflecting healing changes (lesions were largely lytic on the prior PET-CT). No pathologic fractures or spinal canal compromise. 2. No findings for abdominal/pelvic metastatic disease. 3. Numerous bilateral subsolid/ground-glass pulmonary nodules will require attention on future scans. 4. Stable left adrenal gland adenoma. Aortic Atherosclerosis (ICD10-I70.0) and Emphysema (ICD10-J43.9). Electronically Signed   By: Marijo Sanes M.D.   On: 12/29/2020 10:57      No orders of the defined types were placed in this encounter.  All questions were answered. The patient knows to call the clinic with any problems, questions or concerns. No barriers to learning was detected. The total time spent in the appointment was 30 minutes.     [name], MD 12/29/2020   I, Wilburn Mylar, am acting as scribe for [name], MD.   I have reviewed the above documentation for accuracy and completeness, and I agree with the above.

## 2020-12-31 ENCOUNTER — Inpatient Hospital Stay: Payer: 59

## 2020-12-31 ENCOUNTER — Other Ambulatory Visit: Payer: Self-pay

## 2020-12-31 VITALS — BP 109/75 | HR 78 | Temp 98.1°F | Resp 18

## 2020-12-31 DIAGNOSIS — Z95828 Presence of other vascular implants and grafts: Secondary | ICD-10-CM

## 2020-12-31 DIAGNOSIS — Z5111 Encounter for antineoplastic chemotherapy: Secondary | ICD-10-CM | POA: Diagnosis not present

## 2020-12-31 MED ORDER — SODIUM CHLORIDE 0.9% FLUSH
10.0000 mL | Freq: Once | INTRAVENOUS | Status: AC
  Administered 2020-12-31: 10 mL

## 2020-12-31 MED ORDER — HEPARIN SOD (PORK) LOCK FLUSH 100 UNIT/ML IV SOLN
500.0000 [IU] | Freq: Once | INTRAVENOUS | Status: AC
  Administered 2020-12-31: 500 [IU]

## 2021-01-11 ENCOUNTER — Telehealth: Payer: Self-pay

## 2021-01-11 NOTE — Telephone Encounter (Signed)
This nurse received a message from this patient stating that since her last treatment on 12/29/20 she has not been feeling well.  However within the last 2-3 days she has had nausea, dry heaves, headache, stomach pain, no energy.  Patient stated that she does not want to take chemo treatment scheduled for 01/12/21.  This nurse spoke with MD who stated that treatment can be held however, she would like the patient to still come in for IV fluids.  This nurse unable to reach patient to make aware of recommendations.  This nurse returned call to [place] and made her aware of MD recommendations.  She states that she will inform patient, also requested for this nurse to inform MD that patient has bright red feet and hands.  No issues with her mouth at this time.  She is concerned about foot/hand syndrome.  This nurse will inform MD.  No further concerns at this time.

## 2021-01-12 ENCOUNTER — Inpatient Hospital Stay: Payer: 59

## 2021-01-12 ENCOUNTER — Telehealth: Payer: Self-pay

## 2021-01-12 ENCOUNTER — Inpatient Hospital Stay: Payer: 59 | Admitting: Hematology

## 2021-01-12 DIAGNOSIS — C169 Malignant neoplasm of stomach, unspecified: Secondary | ICD-10-CM

## 2021-01-12 NOTE — Telephone Encounter (Signed)
This nurse reached out to patient related to missed lab and infusion appointment for today.  Patient states that she told this nurse on yesterday that she would not be able to make it.  This nurse advised patient that MD was made aware of her symptoms and she wanted patient to come in for labs and IV fluids and two attempts were made to reach her. That was unsuccessful.  Then a call was made to the [facility] nurse who advised that she would send a message to the patient.  The patient stated that she did not get a message and she just does not feel up to coming in today.  She states she now has diarrhea and developed a fever on yesterday evening of 100.1. She states that se took a Covid test and the results were negative.  This nurse attempted to encourage to come in for IV fluids.  Patient denies and states that she is able to drink fluids and keep them down. No further questions at this time.  This nurse made MD aware.

## 2021-01-14 ENCOUNTER — Inpatient Hospital Stay: Payer: 59

## 2021-01-26 ENCOUNTER — Encounter: Payer: Self-pay | Admitting: Hematology

## 2021-01-26 ENCOUNTER — Inpatient Hospital Stay: Payer: 59 | Attending: Gynecologic Oncology

## 2021-01-26 ENCOUNTER — Inpatient Hospital Stay: Payer: 59

## 2021-01-26 ENCOUNTER — Inpatient Hospital Stay (HOSPITAL_BASED_OUTPATIENT_CLINIC_OR_DEPARTMENT_OTHER): Payer: 59 | Admitting: Hematology

## 2021-01-26 ENCOUNTER — Other Ambulatory Visit: Payer: Self-pay

## 2021-01-26 VITALS — BP 91/61 | HR 77 | Temp 97.9°F | Resp 17 | Ht 68.0 in | Wt 130.5 lb

## 2021-01-26 DIAGNOSIS — C169 Malignant neoplasm of stomach, unspecified: Secondary | ICD-10-CM | POA: Diagnosis present

## 2021-01-26 DIAGNOSIS — C786 Secondary malignant neoplasm of retroperitoneum and peritoneum: Secondary | ICD-10-CM

## 2021-01-26 DIAGNOSIS — Z95828 Presence of other vascular implants and grafts: Secondary | ICD-10-CM

## 2021-01-26 DIAGNOSIS — G629 Polyneuropathy, unspecified: Secondary | ICD-10-CM | POA: Diagnosis not present

## 2021-01-26 DIAGNOSIS — Z5111 Encounter for antineoplastic chemotherapy: Secondary | ICD-10-CM | POA: Diagnosis present

## 2021-01-26 DIAGNOSIS — C801 Malignant (primary) neoplasm, unspecified: Secondary | ICD-10-CM

## 2021-01-26 DIAGNOSIS — G893 Neoplasm related pain (acute) (chronic): Secondary | ICD-10-CM | POA: Diagnosis not present

## 2021-01-26 DIAGNOSIS — Z452 Encounter for adjustment and management of vascular access device: Secondary | ICD-10-CM | POA: Diagnosis not present

## 2021-01-26 DIAGNOSIS — C7961 Secondary malignant neoplasm of right ovary: Secondary | ICD-10-CM | POA: Insufficient documentation

## 2021-01-26 DIAGNOSIS — C7951 Secondary malignant neoplasm of bone: Secondary | ICD-10-CM | POA: Insufficient documentation

## 2021-01-26 LAB — CBC WITH DIFFERENTIAL (CANCER CENTER ONLY)
Abs Immature Granulocytes: 0.03 10*3/uL (ref 0.00–0.07)
Basophils Absolute: 0 10*3/uL (ref 0.0–0.1)
Basophils Relative: 0 %
Eosinophils Absolute: 0.1 10*3/uL (ref 0.0–0.5)
Eosinophils Relative: 1 %
HCT: 37.1 % (ref 36.0–46.0)
Hemoglobin: 12.3 g/dL (ref 12.0–15.0)
Immature Granulocytes: 1 %
Lymphocytes Relative: 20 %
Lymphs Abs: 1.2 10*3/uL (ref 0.7–4.0)
MCH: 32.6 pg (ref 26.0–34.0)
MCHC: 33.2 g/dL (ref 30.0–36.0)
MCV: 98.4 fL (ref 80.0–100.0)
Monocytes Absolute: 0.8 10*3/uL (ref 0.1–1.0)
Monocytes Relative: 13 %
Neutro Abs: 3.9 10*3/uL (ref 1.7–7.7)
Neutrophils Relative %: 65 %
Platelet Count: 202 10*3/uL (ref 150–400)
RBC: 3.77 MIL/uL — ABNORMAL LOW (ref 3.87–5.11)
RDW: 14.7 % (ref 11.5–15.5)
WBC Count: 5.9 10*3/uL (ref 4.0–10.5)
nRBC: 0 % (ref 0.0–0.2)

## 2021-01-26 LAB — CMP (CANCER CENTER ONLY)
ALT: 20 U/L (ref 0–44)
AST: 23 U/L (ref 15–41)
Albumin: 3.4 g/dL — ABNORMAL LOW (ref 3.5–5.0)
Alkaline Phosphatase: 133 U/L — ABNORMAL HIGH (ref 38–126)
Anion gap: 9 (ref 5–15)
BUN: 13 mg/dL (ref 6–20)
CO2: 24 mmol/L (ref 22–32)
Calcium: 9.3 mg/dL (ref 8.9–10.3)
Chloride: 107 mmol/L (ref 98–111)
Creatinine: 0.61 mg/dL (ref 0.44–1.00)
GFR, Estimated: >60 mL/min (ref >60)
Glucose, Bld: 89 mg/dL (ref 70–99)
Potassium: 4.4 mmol/L (ref 3.5–5.1)
Sodium: 140 mmol/L (ref 135–145)
Total Bilirubin: 0.4 mg/dL (ref 0.3–1.2)
Total Protein: 7 g/dL (ref 6.5–8.1)

## 2021-01-26 MED ORDER — DEXAMETHASONE SODIUM PHOSPHATE 10 MG/ML IJ SOLN
5.0000 mg | Freq: Once | INTRAMUSCULAR | Status: AC
  Administered 2021-01-26: 5 mg via INTRAVENOUS
  Filled 2021-01-26: qty 1

## 2021-01-26 MED ORDER — DEXTROSE 5 % IV SOLN: Freq: Once | INTRAVENOUS | Status: AC

## 2021-01-26 MED ORDER — PALONOSETRON HCL INJECTION 0.25 MG/5ML
0.2500 mg | Freq: Once | INTRAVENOUS | Status: AC
  Administered 2021-01-26: 0.25 mg via INTRAVENOUS
  Filled 2021-01-26: qty 5

## 2021-01-26 MED ORDER — LEUCOVORIN CALCIUM INJECTION 350 MG
400.0000 mg/m2 | Freq: Once | INTRAVENOUS | Status: AC
  Administered 2021-01-26: 636 mg via INTRAVENOUS
  Filled 2021-01-26: qty 31.8

## 2021-01-26 MED ORDER — OXALIPLATIN CHEMO INJECTION 100 MG/20ML
50.0000 mg/m2 | Freq: Once | INTRAVENOUS | Status: AC
  Administered 2021-01-26: 80 mg via INTRAVENOUS
  Filled 2021-01-26: qty 16

## 2021-01-26 MED ORDER — SODIUM CHLORIDE 0.9 % IV SOLN: Freq: Once | INTRAVENOUS | Status: DC

## 2021-01-26 MED ORDER — HEPARIN SOD (PORK) LOCK FLUSH 100 UNIT/ML IV SOLN
500.0000 [IU] | Freq: Once | INTRAVENOUS | Status: DC | PRN
Start: 2021-01-26 — End: 2021-01-26

## 2021-01-26 MED ORDER — SODIUM CHLORIDE 0.9% FLUSH: 10.0000 mL | INTRAVENOUS | Status: DC | PRN

## 2021-01-26 MED ORDER — SODIUM CHLORIDE 0.9% FLUSH
10.0000 mL | Freq: Once | INTRAVENOUS | Status: AC
  Administered 2021-01-26: 10 mL

## 2021-01-26 MED ORDER — HEPARIN SOD (PORK) LOCK FLUSH 100 UNIT/ML IV SOLN
500.0000 [IU] | Freq: Once | INTRAVENOUS | Status: AC
  Administered 2021-01-26: 500 [IU]

## 2021-01-26 MED ORDER — SODIUM CHLORIDE 0.9 % IV SOLN
2400.0000 mg/m2 | INTRAVENOUS | Status: DC
  Administered 2021-01-26: 3800 mg via INTRAVENOUS
  Filled 2021-01-26: qty 76

## 2021-01-26 MED ORDER — CAPECITABINE 500 MG PO TABS
1000.0000 mg/m2 | ORAL_TABLET | Freq: Two times a day (BID) | ORAL | 1 refills | Status: DC
  Filled 2021-01-26: qty 84, 14d supply, fill #0

## 2021-01-26 NOTE — Patient Instructions (Signed)
[facility] ONCOLOGY  Discharge Instructions: Thank you for choosing [facility] to provide your oncology and hematology care.   If you have a lab appointment with the [facility], please go directly to the [facility] and check in at the registration area.   Wear comfortable clothing and clothing appropriate for easy access to any Portacath or PICC line.   We strive to give you quality time with your provider. You may need to reschedule your appointment if you arrive late (15 or more minutes).  Arriving late affects you and other patients whose appointments are after yours.  Also, if you miss three or more appointments without notifying the office, you may be dismissed from the clinic at the provider's discretion.      For prescription refill requests, have your pharmacy contact our office and allow 72 hours for refills to be completed.    Today you received the following chemotherapy and/or immunotherapy agents Oxaliplatin, leucovorin, and 5 FU      To help prevent nausea and vomiting after your treatment, we encourage you to take your nausea medication as directed.  BELOW ARE SYMPTOMS THAT SHOULD BE REPORTED IMMEDIATELY: *FEVER GREATER THAN 100.4 F (38 C) OR HIGHER *CHILLS OR SWEATING *NAUSEA AND VOMITING THAT IS NOT CONTROLLED WITH YOUR NAUSEA MEDICATION *UNUSUAL SHORTNESS OF BREATH *UNUSUAL BRUISING OR BLEEDING *URINARY PROBLEMS (pain or burning when urinating, or frequent urination) *BOWEL PROBLEMS (unusual diarrhea, constipation, pain near the anus) TENDERNESS IN MOUTH AND THROAT WITH OR WITHOUT PRESENCE OF ULCERS (sore throat, sores in mouth, or a toothache) UNUSUAL RASH, SWELLING OR PAIN  UNUSUAL VAGINAL DISCHARGE OR ITCHING   Items with * indicate a potential emergency and should be followed up as soon as possible or go to the Emergency Department if any problems should occur.  Please show the CHEMOTHERAPY ALERT CARD or IMMUNOTHERAPY  ALERT CARD at check-in to the Emergency Department and triage nurse.  Should you have questions after your visit or need to cancel or reschedule your appointment, please contact [facility]  Dept: [phone]  and follow the prompts.  Office hours are 8:00 a.m. to 4:30 p.m. Monday - Friday. Please note that voicemails left after 4:00 p.m. may not be returned until the following business day.  We are closed weekends and major holidays. You have access to a nurse at all times for urgent questions. Please call the main number to the clinic Dept: [phone] and follow the prompts.   For any non-urgent questions, you may also contact your provider using MyChart. We now offer e-Visits for anyone 54 and older to request care online for non-urgent symptoms. For details visit [email or web address].   Also download the MyChart app! Go to the app store, search "MyChart", open the app, select [facility], and log in with your MyChart username and password.  Due to Covid, a mask is required upon entering the hospital/clinic. If you do not have a mask, one will be given to you upon arrival. For doctor visits, patients may have 1 support person aged 60 or older with them. For treatment visits, patients cannot have anyone with them due to current Covid guidelines and our immunocompromised population.

## 2021-01-26 NOTE — Progress Notes (Signed)
[facility]   [phone] [phone]   Clinic Follow up Note   Patient Care Team: Lorrene Reid, PA-C as PCP - General Christene Lye, MD (General Surgery) [facility] [place] [name], MD as Consulting Physician (Hematology and Oncology) [name], RN (Inactive) as Oncology Nurse Navigator [name], MD as Consulting Physician (Gynecologic Oncology)  Date of Service:  01/26/2021  CHIEF COMPLAINT: f/u of metastatic gastric adenocarcinoma  CURRENT THERAPY:  First line chemo FOLFOX every 2 weeks  ASSESSMENT & PLAN:  Sheryl Pratt is a 55 y.o. female with   1. 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 Enlarged left adrenal gland.    -On 08/09/20, she underwent diagnostic laparoscopy with right salpingo-oophorectomy to assure the origin of the mass. The large ovarian mass showed adenocarcinoma with signet ring cell features, IHC studies show liver upper GI primary. Biopsy of other pelvic tissue was negative for metastasis. -She underwent EGD with Dr. Ardis Hughs on 08/26/20. Final pathology showed poorly differentiated adenofibroma with signet rings features. This is the same as her ovarian pathology.  --FO results showed MSI stable disease with low mutation rate, no targetable mutation. HER2(-). Her PD-L1 was negative, so she is not a candidate for immunotherapy as first-line. -I started her on first-line chemo with FOLFOX every 2 weeks on 09/08/20. This was held while she was receiving palliative radiation -Dose reduced with C5 due to developing neuropathy. She tolerated well and had improved neuropathy with the gabapentin. Her counts have also recovered. -restaging CT CAP and bone scan on 12/28/20 showed probable  improvement to skeletal lesions (more sclerotic lesions on CT). -cycle 8 was held due to increased side effects. She is considering changing treatment. We discussed dropping the oxaliplatin or switching to maintenance Xeloda. She is agreeable -Labs reviewed, adequate to proceed with C8 with further dose reduction.   2. Neuropathy G1 -she is currently taking B12 -She was prescribed gabapentin on 12/01/20, which has provided relief.   3. Pulmonary nodules -first seen on CT CAP 12/2020, most of which are sub-solid -she has a history of smoking, and only smokes an occasional cigarette now. -will monitor on future scans   4. Lower back pain secondary to bone mets, muscular/nerve pains -The patient will continue to take NSAIDs for pain control. If needed, she has a prescription for tramadol and oxycodone. -Improved with palliative radiation therapy to the right hip and left pelvis 09/30/20 through 10/26/20. -She began Zometa on 12/29/20.   5. Social Support -She and her husband bought a house in [place], where their children live. They plan to retire there.  -She reports anxiety related to her treatments. I prescribed Xanax on 11/17/20. -she reports her insurance does not cover much in terms of her treatments. She notes she has met with our financial counselor and was told her husband makes too much money for them to qualify for any assistance.   6. Family History of Cancer  -The patient's brother had pancreatic cancer, diagnosed at age 44. She notes unknown cancer in another family member. -Genetic testing done 09/08/20 was negative, with VUS in MLH1 and LZTR1.     PLAN: -proceed with further reduced dose C8 FOLFOX today -labs, f/u in 2 weeks to start Xeloda maintenance therapy if her copay is affordable. I called  in refill to [facility] today     No problem-specific Assessment & Plan notes found for this encounter.   SUMMARY OF ONCOLOGIC HISTORY: Oncology History Overview Note  Cancer  Staging Gastric adenocarcinoma [facility]) Staging form: Stomach, AJCC 8th Edition - Clinical stage from 08/26/2020: Stage IVB (cTX, cN0, pM1) - Signed by [name], MD on 09/06/2020 Stage prefix: Initial diagnosis Total positive nodes: 0 Histologic grade (G): G3 Histologic grading system: 3 grade system    Gastric adenocarcinoma ([place])  07/30/2020 Imaging   CT Abdomen and Pelvis with Contrast  IMPRESSION: 1. Large hypervascular soft tissue mass occupies the pelvis, compressing the urinary bladder and GI structures. No definite connection of this mass is seen to the vaginal cuff. The ovaries are not identified. 2. Enlarged left adrenal gland. Given the presence of soft tissue mass in the pelvis, this may represent metastatic disease to the adrenal gland. 3. Further evaluation with MRI of the abdomen and pelvis may be considered. Surgical consultation is also advised. 4. Right lower pole nonobstructive nephrolithiasis. 5. Aortic atherosclerosis.     08/09/2020 Procedure   Exploratory laparotomy with right salpingo-oophorectomy, omentectomy washings, peritoneal biopsies, and omentectomy under the care of Dr. Ber[place] Lopes    08/09/2020 Pathology Results   FINAL MICROSCOPIC DIAGNOSIS:   A. ADNEXAL MASS, RIGHT, SALPINGO OOPHORECTOMY:  - Adenocarcinoma with signet ring cell features.  - No ovarian surface involvement identified.  - Benign Fallopian tube.  - See comment.   The right adnexal mass is 17.8 cm in greatest dimension and is diffusely involved by adenocarcinoma with diffuse signet ring cell features. Immunohistochemistry shows positivity with cytokeratin 7 and cytokeratin 20.  The tumor is negative with estrogen receptor, GATA3, GCDFP, CD56, chromogranin, synaptic ficin, calretinin, inhibin, Napsin A, TTF-1, WT1 and PAX 8.  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  Possible primary sites  include upper gastrointestinal tract and  pancreas.   08/19/2020 Initial Diagnosis   Adenocarcinoma ([place])   08/26/2020 Procedure   Upper Endoscopy by Dr Ardis Hughs  IMPRESSION - There were no overtly neoplastic findings. - There was mild, non-specific gastritis and bulbar duodenitis. Both sites were biopsied given large pelvic mass pathology.   08/26/2020 Pathology Results   Surgical [P], gastric antrum and gastric body bx - POORLY DIFFERENTIATED ADENOCARCINOMA WITH SIGNET RING FEATURES. - SEE MICROSCOPIC DESCRIPTION.    08/26/2020 Cancer Staging   Staging form: Stomach, AJCC 8th Edition - Clinical stage from 08/26/2020: Stage IVB (cTX, cN0, pM1) - Signed by [name], MD on 09/06/2020 Stage prefix: Initial diagnosis Total positive nodes: 0 Histologic grade (G): G3 Histologic grading system: 3 grade system   08/29/2020 PET scan   IMPRESSION: 1. Dominant finding is multifocal hypermetabolic skeletal metastasis. Lesions are subtly evident on CT with subtle sclerosis. Lesions include the pelvis, spine and ribs. 2. Hypermetabolic metastasis in the RIGHT femoral neck with sclerosis may be at risk for pathologic fracture. 3. Resection of large pelvic mass. No residual carcinoma evident within the pelvis. 4. Benign LEFT adrenal adenoma. 5. Diffuse mild metabolic activity in the gastric fundus and proximal body is nonspecific. Recommend correlation with recent upper endoscopy.     09/08/2020 -  Chemotherapy   First-line FOLFOX q2weeks starting 09/08/20. Held 09/30/20 to proceed with palliative radiation. Restarted 11/03/20    09/26/2020 Genetic Testing   Negative hereditary cancer genetic testing: no pathogenic variants detected in [facility] CustomNext-Cancer +RNAinsight Panel.  Variants of uncertain significance detected in LZTR1 at  p.K452E (c.1354A>G) and MLH1 at p.P747S (c.2239C>T).  The report date is September 21, 2020.   The CustomNext-Cancer +RNAinsight Panel offered by [facility] and includes sequencing and rearrangement analysis for  the following 91 genes: AIP, ALK, APC, ATM, AXIN2, BAP1, BARD1, BLM, BMPR1A, BRCA1, BRCA2, BRIP1, CDC73, CDH1, CDK4, CDKN1B, CDKN2A, CHEK2, CTNNA1, DICER1, FANCC, FH, FLCN, GALNT12, KIF1B, LZTR1, MAX, MEN1, MET, MLH1, MRE11A, MSH2, MSH3, MSH6, MUTYH, NBN, NF1, NF2, NTHL1, PALB2, PHOX2B, PMS2, POT1, PRKAR1A, PTCH1, PTEN, RAD50, RAD51C, RAD51D, RB1, RECQL, RET, SDHA, SDHAF2, SDHB, SDHC, SDHD, SMAD4, SMARCA4, SMARCB1, SMARCE1, STK11, SUFU, TMEM127, TP53, TSC1, TSC2, VHL and XRCC2 (sequencing and deletion/duplication); CASR, CFTR, CPA1, CTRC, EGFR, EGLN1, FAM175A, HOXB13, KIT, MITF, MLH3, PALLD, PDGFRA, POLD1, POLE, PRSS1, RINT1, RPS20, SPINK1 and TERT (sequencing only); EPCAM and GREM1 (deletion/duplication only). RNA data is routinely analyzed for use in variant interpretation for all genes.   10/04/2020 - 10/26/2020 Radiation Therapy   Palliative Radiation to right hip/lumbar spine with Dr Lisbeth Renshaw   Site Technique Total Dose (Gy) Dose per Fx (Gy) Completed Fx Beam Energies  Femur Right: Ext_Rt Complex 37.5/37.5 2.5 15/15 15X  Pelvis: Pelvis_Left SI 3D 37.5/37.5 2.5 15/15 10X, 15X    12/28/2020 Imaging   CT CAP  IMPRESSION: 1. Extensive and diffuse sclerotic metastatic bone disease likely reflecting healing changes (lesions were largely lytic on the prior PET-CT). No pathologic fractures or spinal canal compromise. 2. No findings for abdominal/pelvic metastatic disease. 3. Numerous bilateral subsolid/ground-glass pulmonary nodules will require attention on future scans. 4. Stable left adrenal gland adenoma.   Aortic Atherosclerosis (ICD10-I70.0) and Emphysema (ICD10-J43.9).   12/28/2020 Imaging   Bone Scan  IMPRESSION: Multifocal radiotracer accumulation in the axial and appendicular skeleton consistent with metastatic disease. Relatively diffuse uptake is identified in the right femoral neck, compatible with the sclerotic lesion seen on the 12/28/2020 CT exam.      INTERVAL HISTORY:  Sheryl Pratt is here for a follow up of metastatic gastric adenocarcinoma. She was last seen by me on 12/29/20. She presents to the clinic alone. She reports she is feeling better than two weeks ago. She reports she had a fever, constant diarrhea, and her symptoms lasted a week. She notes she was able to drink fluids but not eat as much. She notes the last week she has been able to eat. I advised her, if in the future she experiences something like this again, to be seen by either [place] or urgent care. She wonders if this is related to her chemo and asked if she could switch treatments. I discussed this with her, and advised her that I believe her sudden sickness was likely virus related. I explained why    All other systems were reviewed with the patient and are negative.  MEDICAL HISTORY:  Past Medical History:  Diagnosis Date   Allergy    Arthritis    Bloating    Breast mass, right    Cancer (HCC)    metastaic to bones and abd, May 2022   Constipation    Family history of gastric cancer 09/08/2020   Family history of ovarian cancer 09/08/2020   Family history of pancreatic cancer 09/08/2020   Family history of skin cancer 09/08/2020   Migraine    Pelvic mass    PONV (postoperative nausea and vomiting)    Seasonal allergies    Vitamin D deficiency     SURGICAL HISTORY: Past Surgical History:  Procedure Laterality Date   APPENDECTOMY  AUGMENTATION MAMMAPLASTY Bilateral    BREAST BIOPSY Right 2014   fibroadenoma   BREAST SURGERY  18 years ago   augmentation.   IR IMAGING GUIDED PORT INSERTION  09/06/2020   LAPAROTOMY  08/09/2020   Procedure: EXPLORATORY LAPAROTOMY, MASS EXCISION, PERITONEAL BIOPSIES;  Surgeon: [name], MD;  Location: [facility];  Service: Gynecology;;   OMENTECTOMY N/A 08/09/2020   Procedure: OMENTECTOMY;  Surgeon: [name], MD;  Location: [facility];  Service: Gynecology;  Laterality: N/A;   SALPINGOOPHORECTOMY Right 08/09/2020   Procedure: OPEN RIGHT SALPINGO  OOPHORECTOMY;  Surgeon: [name], MD;  Location: [facility];  Service: Gynecology;  Laterality: Right;   VAGINAL HYSTERECTOMY  2015    I have reviewed the social history and family history with the patient and they are unchanged from previous note.  ALLERGIES:  is allergic to codeine and wellbutrin [bupropion].  MEDICATIONS:  Current Outpatient Medications  Medication Sig Dispense Refill   acetaminophen (TYLENOL) 325 MG tablet Take by mouth every 6 (six) hours as needed for mild pain.     ALPRAZolam (XANAX) 0.25 MG tablet Take 1 tablet (0.25 mg total) by mouth 2 (two) times daily as needed for anxiety. 20 tablet 0   cholecalciferol (VITAMIN D3) 25 MCG (1000 UNIT) tablet Take 1,000 Units by mouth daily.     diphenhydrAMINE (BENADRYL) 25 mg capsule Take 25 mg by mouth at bedtime as needed for allergies.     gabapentin (NEURONTIN) 100 MG capsule Take 1-2 capsules (100-200 mg total) by mouth at bedtime. 60 capsule 0   ibuprofen (ADVIL) 800 MG tablet Take 1 tablet (800 mg total) by mouth every 8 (eight) hours as needed for moderate pain. For AFTER surgery only 30 tablet 0   Ibuprofen-diphenhydrAMINE HCl (IBUPROFEN PM) 200-25 MG CAPS Take 1 tablet by mouth at bedtime as needed (sleep).     ipratropium (ATROVENT) 0.03 % nasal spray Place 2 sprays into both nostrils every 12 (twelve) hours. 30 mL 0   lidocaine-prilocaine (EMLA) cream Apply to affected area once 30 g 3   Multiple Vitamin (MULTIVITAMIN) tablet Take 1 tablet by mouth daily.     ondansetron (ZOFRAN) 8 MG tablet Take 1 tablet (8 mg total) by mouth 2 (two) times daily as needed for refractory nausea / vomiting. Start on day 3 after chemotherapy. 30 tablet 1   oxyCODONE (OXY IR/ROXICODONE) 5 MG immediate release tablet Take 1 tablet (5 mg total) by mouth every 4 (four) hours as needed for severe pain. For severe pain only, do not take and drive 20 tablet 0   prochlorperazine (COMPAZINE) 10 MG tablet Take 1 tablet (10 mg total) by  mouth every 6 (six) hours as needed (Nausea or vomiting). 30 tablet 1   Psyllium (METAMUCIL PO) Take 1 Dose by mouth daily.     senna-docusate (SENOKOT-S) 8.6-50 MG tablet Take 2 tablets by mouth at bedtime. Do not take if having diarrhea (Patient taking differently: Take 2 tablets by mouth daily. Do not take if having diarrhea) 60 tablet 0   Tetrahydrozoline HCl (VISINE OP) Place 1 drop into both eyes daily as needed (itching Daralene Milch).     traMADol (ULTRAM) 50 MG tablet Take 1 tablet (50 mg total) by mouth every 6 (six) hours as needed for severe pain. For AFTER surgery, do not take and drive 10 tablet 0   vitamin B-12 (CYANOCOBALAMIN) 1000 MCG tablet Take 1,000 mcg by mouth daily.     vitamin C (ASCORBIC ACID) 500 MG tablet  Take 500 mg by mouth daily.     Vitamin D, Ergocalciferol, (DRISDOL) 1.25 MG (50000 UT) CAPS capsule Take one tablet wkly 12 capsule 3   vitamin E 180 MG (400 UNITS) capsule Take 400 Units by mouth daily.     No current facility-administered medications for this visit.    PHYSICAL EXAMINATION: ECOG PERFORMANCE STATUS: 1 - Symptomatic but completely ambulatory  Vitals:   01/26/21 0819  BP: 91/61  Pulse: 77  Resp: 17  Temp: 97.9 F (36.6 C)  SpO2: 96%   Wt Readings from Last 3 Encounters:  01/26/21 130 lb 8 oz (59.2 kg)  12/29/20 130 lb 8 oz (59.2 kg)  12/15/20 128 lb 14.4 oz (58.5 kg)     GENERAL:alert, no distress and comfortable SKIN: skin color normal, no rashes or significant lesions EYES: normal, Conjunctiva are pink and non-injected, sclera clear  NEURO: alert & oriented x 3 with fluent speech  LABORATORY DATA:  I have reviewed the data as listed CBC Latest Ref Rng & Units 01/26/2021 12/29/2020 12/15/2020  WBC 4.0 - 10.5 K/uL 5.9 4.6 4.9  Hemoglobin 12.0 - 15.0 g/dL 12.3 12.4 13.1  Hematocrit 36.0 - 46.0 % 37.1 36.9 39.2  Platelets 150 - 400 K/uL 202 143(L) 158     CMP Latest Ref Rng & Units 01/26/2021 12/29/2020 12/15/2020  Glucose 70 - 99 mg/dL 89  84 80  BUN 6 - 20 mg/dL _0 Creatinine 0.44 - 1.00 mg/dL 0.61 0.64 0.64  Sodium 135 - 145 mmol/L 140 141 138  Potassium 3.5 - 5.1 mmol/L 4.4 4.1 4.8  Chloride 98 - 111 mmol/L 107 107 106  CO2 22 - 32 mmol/L _1 Calcium 8.9 - 10.3 mg/dL 9.3 9.3 9.0  Total Protein 6.5 - 8.1 g/dL 7.0 6.9 7.4  Total Bilirubin 0.3 - 1.2 mg/dL 0.4 0.3 0.3  Alkaline Phos 38 - 126 U/L 133(H) 187(H) 181(H)  AST 15 - 41 U/L 23 38 46(H)  ALT 0 - 44 U/L 20 38 44      RADIOGRAPHIC STUDIES: I have personally reviewed the radiological images as listed and agreed with the findings in the report. No results found.    No orders of the defined types were placed in this encounter.  All questions were answered. The patient knows to call the clinic with any problems, questions or concerns. No barriers to learning was detected. The total time spent in the appointment was 30 minutes.     [name], MD 01/26/2021   I, Wilburn Mylar, am acting as scribe for [name], MD.   I have reviewed the above documentation for accuracy and completeness, and I agree with the above.

## 2021-01-27 ENCOUNTER — Other Ambulatory Visit (HOSPITAL_COMMUNITY): Payer: Self-pay

## 2021-01-27 ENCOUNTER — Telehealth: Payer: Self-pay | Admitting: Pharmacist

## 2021-01-27 ENCOUNTER — Telehealth: Payer: Self-pay | Admitting: Hematology

## 2021-01-27 DIAGNOSIS — C7961 Secondary malignant neoplasm of right ovary: Secondary | ICD-10-CM

## 2021-01-27 MED ORDER — CAPECITABINE 500 MG PO TABS: 1000.0000 mg/m2 | ORAL_TABLET | Freq: Two times a day (BID) | ORAL | 1 refills | Status: DC

## 2021-01-27 NOTE — Telephone Encounter (Signed)
Scheduled follow-up appointment per 10/6 los. Patient is aware. 

## 2021-01-27 NOTE — Telephone Encounter (Addendum)
Oral Oncology Pharmacist Encounter  Received new prescription for Xeloda (capecitabine) for the treatment of metastatic gastric cancer, planned duration until disease progression or unacceptable drug toxicity.  CBC w/ Diff and CMP from 01/27/21 assessed, no relevant lab abnormalities noted. Prescription dose and frequency assessed for appropriateness.   Current medication list in Epic reviewed, no relevant/significant DDIs with Xeloda identified.  Evaluated chart and no patient barriers to medication adherence noted.   Patient agreement for treatment documented in MD note on 01/26/21.  Patient's insurance requires Xeloda be filled through [employer] Prescription has been redirected for dispensing.   Oral Oncology Clinic will continue to follow for insurance authorization, copayment issues, initial counseling and start date.  Leron Croak, PharmD, BCPS Hematology/Oncology Clinical Pharmacist [facility] [phone] 01/27/2021 9:33 AM

## 2021-01-28 ENCOUNTER — Other Ambulatory Visit: Payer: Self-pay

## 2021-01-28 ENCOUNTER — Inpatient Hospital Stay: Payer: 59

## 2021-01-28 VITALS — BP 103/67 | HR 80 | Temp 97.9°F | Resp 18

## 2021-01-28 DIAGNOSIS — C169 Malignant neoplasm of stomach, unspecified: Secondary | ICD-10-CM

## 2021-01-28 DIAGNOSIS — Z5111 Encounter for antineoplastic chemotherapy: Secondary | ICD-10-CM | POA: Diagnosis not present

## 2021-01-28 MED ORDER — SODIUM CHLORIDE 0.9% FLUSH
10.0000 mL | INTRAVENOUS | Status: DC | PRN
  Administered 2021-01-28: 10 mL

## 2021-01-28 MED ORDER — HEPARIN SOD (PORK) LOCK FLUSH 100 UNIT/ML IV SOLN
500.0000 [IU] | Freq: Once | INTRAVENOUS | Status: AC | PRN
  Administered 2021-01-28: 500 [IU]

## 2021-01-30 NOTE — Telephone Encounter (Signed)
Oral Chemotherapy Pharmacist Encounter  I spoke with patient for overview of: Xeloda (capecitabine) for the  treatment of metastatic gastric cancer, planned duration until disease progression or unacceptable drug toxicity.  Counseled patient on administration, dosing, side effects, monitoring, drug-food interactions, safe handling, storage, and disposal.  Patient will take Xeloda 500mg  tablets, 3 tablets (1500mg ) by mouth in AM and 3 tabs (1500mg ) by mouth in PM, within 30 minutes of finishing meals, for 14 days on, 7 days off, repeated every 21 days.  Xeloda start date: currently pending until after next MD office visit on 02/09/21 - Sheryl Pratt knows not to start medication until directed by MD.   Adverse effects include but are not limited to: fatigue, decreased blood counts, GI upset, diarrhea, mouth sores, and hand-foot syndrome.  Patient has anti-emetic on hand and knows to take it if nausea develops.   Patient will obtain anti diarrheal and alert the office of 4 or more loose stools above baseline.  Reviewed with patient importance of keeping a medication schedule and plan for any missed doses. No barriers to medication adherence identified.  Medication reconciliation performed and medication/allergy list updated.  Patient's insurance requires medication to be filled through [employer] Per pharmacy, patient has $0 copay for first fill of Xeloda. Phone number provided to patient (tele: [phone]) to set up medication shipment. Patient knows not to start until directed by Dr. Burr Medico.  All questions answered.  Ms. Nevils voiced understanding and appreciation.   Medication education handout placed in mail for patient. Patient knows to call the office with questions or concerns. Oral Chemotherapy Clinic phone number provided to patient.   Leron Croak, PharmD, BCPS Hematology/Oncology Clinical Pharmacist [facility] [phone] 01/30/2021 2:28 PM

## 2021-02-09 ENCOUNTER — Inpatient Hospital Stay (HOSPITAL_BASED_OUTPATIENT_CLINIC_OR_DEPARTMENT_OTHER): Payer: 59 | Admitting: Hematology

## 2021-02-09 ENCOUNTER — Encounter: Payer: Self-pay | Admitting: Hematology

## 2021-02-09 ENCOUNTER — Inpatient Hospital Stay: Payer: 59

## 2021-02-09 ENCOUNTER — Other Ambulatory Visit: Payer: Self-pay

## 2021-02-09 VITALS — BP 92/62 | HR 80 | Temp 98.4°F | Resp 19 | Ht 68.0 in | Wt 133.9 lb

## 2021-02-09 DIAGNOSIS — C169 Malignant neoplasm of stomach, unspecified: Secondary | ICD-10-CM | POA: Diagnosis not present

## 2021-02-09 DIAGNOSIS — C7961 Secondary malignant neoplasm of right ovary: Secondary | ICD-10-CM

## 2021-02-09 DIAGNOSIS — C801 Malignant (primary) neoplasm, unspecified: Secondary | ICD-10-CM

## 2021-02-09 DIAGNOSIS — Z5111 Encounter for antineoplastic chemotherapy: Secondary | ICD-10-CM | POA: Diagnosis not present

## 2021-02-09 DIAGNOSIS — R1900 Intra-abdominal and pelvic swelling, mass and lump, unspecified site: Secondary | ICD-10-CM

## 2021-02-09 DIAGNOSIS — Z95828 Presence of other vascular implants and grafts: Secondary | ICD-10-CM

## 2021-02-09 LAB — CBC WITH DIFFERENTIAL (CANCER CENTER ONLY)
Abs Immature Granulocytes: 0.01 10*3/uL (ref 0.00–0.07)
Basophils Absolute: 0 10*3/uL (ref 0.0–0.1)
Basophils Relative: 1 %
Eosinophils Absolute: 0.1 10*3/uL (ref 0.0–0.5)
Eosinophils Relative: 2 %
HCT: 36.1 % (ref 36.0–46.0)
Hemoglobin: 12.1 g/dL (ref 12.0–15.0)
Immature Granulocytes: 0 %
Lymphocytes Relative: 23 %
Lymphs Abs: 0.7 10*3/uL (ref 0.7–4.0)
MCH: 33 pg (ref 26.0–34.0)
MCHC: 33.5 g/dL (ref 30.0–36.0)
MCV: 98.4 fL (ref 80.0–100.0)
Monocytes Absolute: 0.5 10*3/uL (ref 0.1–1.0)
Monocytes Relative: 15 %
Neutro Abs: 1.9 10*3/uL (ref 1.7–7.7)
Neutrophils Relative %: 59 %
Platelet Count: 177 10*3/uL (ref 150–400)
RBC: 3.67 MIL/uL — ABNORMAL LOW (ref 3.87–5.11)
RDW: 14.8 % (ref 11.5–15.5)
WBC Count: 3.1 10*3/uL — ABNORMAL LOW (ref 4.0–10.5)
nRBC: 0 % (ref 0.0–0.2)

## 2021-02-09 LAB — CMP (CANCER CENTER ONLY)
ALT: 31 U/L (ref 0–44)
AST: 35 U/L (ref 15–41)
Albumin: 3.3 g/dL — ABNORMAL LOW (ref 3.5–5.0)
Alkaline Phosphatase: 149 U/L — ABNORMAL HIGH (ref 38–126)
Anion gap: 9 (ref 5–15)
BUN: 11 mg/dL (ref 6–20)
CO2: 23 mmol/L (ref 22–32)
Calcium: 8.7 mg/dL — ABNORMAL LOW (ref 8.9–10.3)
Chloride: 110 mmol/L (ref 98–111)
Creatinine: 0.62 mg/dL (ref 0.44–1.00)
GFR, Estimated: >60 mL/min (ref >60)
Glucose, Bld: 106 mg/dL — ABNORMAL HIGH (ref 70–99)
Potassium: 3.9 mmol/L (ref 3.5–5.1)
Sodium: 142 mmol/L (ref 135–145)
Total Bilirubin: 0.3 mg/dL (ref 0.3–1.2)
Total Protein: 6.8 g/dL (ref 6.5–8.1)

## 2021-02-09 MED ORDER — OXYCODONE HCL 5 MG PO TABS: 5.0000 mg | ORAL_TABLET | ORAL | 0 refills | Status: AC | PRN

## 2021-02-09 MED ORDER — PROCHLORPERAZINE MALEATE 10 MG PO TABS: 10.0000 mg | ORAL_TABLET | Freq: Four times a day (QID) | ORAL | 1 refills | Status: DC | PRN

## 2021-02-09 MED ORDER — HEPARIN SOD (PORK) LOCK FLUSH 100 UNIT/ML IV SOLN
500.0000 [IU] | Freq: Once | INTRAVENOUS | Status: AC
  Administered 2021-02-09: 500 [IU]

## 2021-02-09 MED ORDER — SODIUM CHLORIDE 0.9% FLUSH
10.0000 mL | Freq: Once | INTRAVENOUS | Status: AC
  Administered 2021-02-09: 10 mL

## 2021-02-09 MED ORDER — GABAPENTIN 100 MG PO CAPS: 100.0000 mg | ORAL_CAPSULE | Freq: Two times a day (BID) | ORAL | 0 refills | Status: DC

## 2021-02-09 NOTE — Progress Notes (Addendum)
[place]   [phone] [phone]   Clinic Follow up Note   Patient Care Team: Lorrene Reid, PA-C as PCP - General Christene Lye, MD (General Surgery) [facility], [place] [name], MD as Consulting Physician (Hematology and Oncology) [name], RN (Inactive) as Oncology Nurse Navigator [name], MD as Consulting Physician (Gynecologic Oncology)  Date of Service:  02/09/2021  CHIEF COMPLAINT: f/u of metastatic gastric adenocarcinoma  CURRENT THERAPY:  First line chemo FOLFOX every 2 weeks, change to Xeloda 1530m q12h on day 1-14 every 21 days on 02/09/2021  ASSESSMENT & PLAN:  Sheryl BORRORis a 55y.o. female with   1. 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 Enlarged left adrenal gland.    -On 08/09/20, she underwent diagnostic laparoscopy with right salpingo-oophorectomy to assure the origin of the mass. The large ovarian mass showed adenocarcinoma with signet ring cell features, IHC studies show liver upper GI primary. Biopsy of other pelvic tissue was negative for metastasis. -She underwent EGD with Dr. JArdis Hughson 08/26/20. Final pathology showed poorly differentiated adenofibroma with signet rings features. This is the same as her ovarian pathology.  --FO results showed MSI stable disease with low mutation rate, no targetable mutation. HER2(-). Her PD-L1 was negative, so she is not a candidate for immunotherapy as first-line. -I started her on first-line chemo with FOLFOX every 2 weeks on 09/08/20. This was held while she was receiving palliative radiation -Dose reduced with C5 due to developing neuropathy. She tolerated well and had improved neuropathy with the gabapentin. Her counts have  also recovered. -restaging CT CAP and bone scan on 12/28/20 showed probable improvement to skeletal lesions (more sclerotic lesions on CT). -cycle 8 was held due to increased side effects then given at a further lowered dose.  -because of her increasing side effects, we are switching her to maintenance Xeloda. She notes she has not yet received the medication. She will begin once she receives it. Potential AE and management from Xeloda reviewed with her again today    2. Neuropathy G1 -she is currently taking B12 -She was prescribed gabapentin on 12/01/20, which has provided relief.   3. Pulmonary nodules -first seen on CT CAP 12/2020, most of which are sub-solid -she has a history of smoking, and only smokes an occasional cigarette now. -will monitor on future scans   4. Lower back pain secondary to bone mets, muscular/nerve pains -Improved with palliative radiation therapy to the right hip and left pelvis 09/30/20 through 10/26/20. -She began Zometa on 12/29/20. -she reports her right hip and leg pain has worsened in the last month. I advised her to increase her gabapentin to two times at night. I will also call in oxycodone for her to use more frequently. -I recommend she try to exercise, especially with things like a stationary bike that will take pressure off her hip.   5. Social Support -[place] They plan to retire there.  -She reports anxiety related to her treatments. I prescribed Xanax on 11/17/20. -she reports her insurance does not cover much in terms of her treatments. She notes she has met with our financial counselor and was told her husband makes too much money for them to qualify  for any assistance.   6. Family History of Cancer  -The patient's brother had pancreatic cancer, diagnosed at age 36. She notes unknown cancer in another family member. -Genetic testing done 09/08/20 was negative, with VUS in MLH1 and  LZTR1.     PLAN: -increase gabapentin to twice daily and take 2 cap at bedtime, I refilled today -I also refilled oxycodone and compazine -start Xeloda once she receives it -labs and f/u in 3 weeks. She knows to call if she develops any concerning or worsening side effects.   No problem-specific Assessment & Plan notes found for this encounter.   SUMMARY OF ONCOLOGIC HISTORY: Oncology History Overview Note  Cancer Staging Gastric adenocarcinoma [facility]) Staging form: Stomach, AJCC 8th Edition - Clinical stage from 08/26/2020: Stage IVB (cTX, cN0, pM1) - Signed by [name], MD on 09/06/2020 Stage prefix: Initial diagnosis Total positive nodes: 0 Histologic grade (G): G3 Histologic grading system: 3 grade system    Gastric adenocarcinoma ([place])  07/30/2020 Imaging   CT Abdomen and Pelvis with Contrast  IMPRESSION: 1. Large hypervascular soft tissue mass occupies the pelvis, compressing the urinary bladder and GI structures. No definite connection of this mass is seen to the vaginal cuff. The ovaries are not identified. 2. Enlarged left adrenal gland. Given the presence of soft tissue mass in the pelvis, this may represent metastatic disease to the adrenal gland. 3. Further evaluation with MRI of the abdomen and pelvis may be considered. Surgical consultation is also advised. 4. Right lower pole nonobstructive nephrolithiasis. 5. Aortic atherosclerosis.     08/09/2020 Procedure   Exploratory laparotomy with right salpingo-oophorectomy, omentectomy washings, peritoneal biopsies, and omentectomy under the care of Dr. Berline Lopes    08/09/2020 Pathology Results   FINAL MICROSCOPIC DIAGNOSIS:   A. ADNEXAL MASS, RIGHT, SALPINGO OOPHORECTOMY:  - Adenocarcinoma with signet ring cell features.  - No ovarian surface involvement identified.  - Benign Fallopian tube.  - See comment.   The right adnexal mass is 17.8 cm in greatest dimension and is diffusely involved by adenocarcinoma with  diffuse signet ring cell features. Immunohistochemistry shows positivity with cytokeratin 7 and cytokeratin 20.  The tumor is negative with estrogen receptor, GATA3, GCDFP, CD56, chromogranin, synaptic ficin, calretinin, inhibin, Napsin A, TTF-1, WT1 and PAX 8.  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  Possible primary sites  include upper gastrointestinal tract and pancreas.   08/19/2020 Initial Diagnosis   Adenocarcinoma ([place])   08/26/2020 Procedure   Upper Endoscopy by Dr Ardis Hughs  IMPRESSION - There were no overtly neoplastic findings. - There was mild, non-specific gastritis and bulbar duodenitis. Both sites were biopsied given large pelvic mass pathology.   08/26/2020 Pathology Results   Surgical [P], gastric antrum and gastric body bx - POORLY DIFFERENTIATED ADENOCARCINOMA WITH SIGNET RING FEATURES. - SEE MICROSCOPIC DESCRIPTION.    08/26/2020 Cancer Staging   Staging form: Stomach, AJCC 8th Edition - Clinical stage from 08/26/2020: Stage IVB (cTX, cN0, pM1) - Signed by [name], MD on 09/06/2020 Stage prefix: Initial diagnosis Total positive nodes: 0 Histologic grade (G): G3 Histologic grading system: 3 grade system   08/29/2020 PET scan   IMPRESSION: 1. Dominant finding is multifocal hypermetabolic skeletal metastasis. Lesions are subtly evident on CT with subtle sclerosis. Lesions include the pelvis, spine and ribs. 2. Hypermetabolic metastasis in the RIGHT femoral neck with sclerosis may be at risk for pathologic fracture. 3. Resection of large pelvic mass. No residual carcinoma evident within  the pelvis. 4. Benign LEFT adrenal adenoma. 5. Diffuse mild metabolic activity in the gastric fundus and proximal body is nonspecific. Recommend correlation with recent upper endoscopy.     09/08/2020 -  Chemotherapy   First-line FOLFOX q2weeks starting 09/08/20. Held 09/30/20 to proceed with palliative radiation. Restarted  11/03/20    09/26/2020 Genetic Testing   Negative hereditary cancer genetic testing: no pathogenic variants detected in [facility] CustomNext-Cancer +RNAinsight Panel.  Variants of uncertain significance detected in LZTR1 at  p.K452E (c.1354A>G) and MLH1 at p.P747S (c.2239C>T).  The report date is September 21, 2020.   The CustomNext-Cancer +RNAinsight Panel offered by [facility] and includes sequencing and rearrangement analysis for the following 91 genes: AIP, ALK, APC, ATM, AXIN2, BAP1, BARD1, BLM, BMPR1A, BRCA1, BRCA2, BRIP1, CDC73, CDH1, CDK4, CDKN1B, CDKN2A, CHEK2, CTNNA1, DICER1, FANCC, FH, FLCN, GALNT12, KIF1B, LZTR1, MAX, MEN1, MET, MLH1, MRE11A, MSH2, MSH3, MSH6, MUTYH, NBN, NF1, NF2, NTHL1, PALB2, PHOX2B, PMS2, POT1, PRKAR1A, PTCH1, PTEN, RAD50, RAD51C, RAD51D, RB1, RECQL, RET, SDHA, SDHAF2, SDHB, SDHC, SDHD, SMAD4, SMARCA4, SMARCB1, SMARCE1, STK11, SUFU, TMEM127, TP53, TSC1, TSC2, VHL and XRCC2 (sequencing and deletion/duplication); CASR, CFTR, CPA1, CTRC, EGFR, EGLN1, FAM175A, HOXB13, KIT, MITF, MLH3, PALLD, PDGFRA, POLD1, POLE, PRSS1, RINT1, RPS20, SPINK1 and TERT (sequencing only); EPCAM and GREM1 (deletion/duplication only). RNA data is routinely analyzed for use in variant interpretation for all genes.   10/04/2020 - 10/26/2020 Radiation Therapy   Palliative Radiation to right hip/lumbar spine with Dr Lisbeth Renshaw   Site Technique Total Dose (Gy) Dose per Fx (Gy) Completed Fx Beam Energies  Femur Right: Ext_Rt Complex 37.5/37.5 2.5 15/15 15X  Pelvis: Pelvis_Left SI 3D 37.5/37.5 2.5 15/15 10X, 15X    12/28/2020 Imaging   CT CAP  IMPRESSION: 1. Extensive and diffuse sclerotic metastatic bone disease likely reflecting healing changes (lesions were largely lytic on the prior PET-CT). No pathologic fractures or spinal canal compromise. 2. No findings for abdominal/pelvic metastatic disease. 3. Numerous bilateral subsolid/ground-glass pulmonary nodules will require attention on future scans. 4. Stable  left adrenal gland adenoma.   Aortic Atherosclerosis (ICD10-I70.0) and Emphysema (ICD10-J43.9).   12/28/2020 Imaging   Bone Scan  IMPRESSION: Multifocal radiotracer accumulation in the axial and appendicular skeleton consistent with metastatic disease. Relatively diffuse uptake is identified in the right femoral neck, compatible with the sclerotic lesion seen on the 12/28/2020 CT exam.      INTERVAL HISTORY:  Sheryl Pratt is here for a follow up of metastatic gastric cancer. She was last seen by me on 01/26/21. She presents to the clinic alone. She reports continued pain to her right hip that radiates down her leg. She feels it is getting worse in the last month. She notes she uses gabapentin to sleep at night, sometimes also using tylenol and benadryl. She notes she only rarely uses the oxycodone. She notes she was supposed to get her Xeloda delivered yesterday, but it has not come yet. She expressed concern for H-pylori, given that her sister had it and was told she got it from her well water. Looking back, her pathology from 08/26/20 was negative.   All other systems were reviewed with the patient and are negative.  MEDICAL HISTORY:  Past Medical History:  Diagnosis Date   Allergy    Arthritis    Bloating    Breast mass, right    Cancer ([place])    metastaic to bones and abd, May 2022   Constipation    Family history of gastric cancer 09/08/2020   Family history  of ovarian cancer 09/08/2020   Family history of pancreatic cancer 09/08/2020   Family history of skin cancer 09/08/2020   Migraine    Pelvic mass    PONV (postoperative nausea and vomiting)    Seasonal allergies    Vitamin D deficiency     SURGICAL HISTORY: Past Surgical History:  Procedure Laterality Date   APPENDECTOMY     AUGMENTATION MAMMAPLASTY Bilateral    BREAST BIOPSY Right 2014   fibroadenoma   BREAST SURGERY  18 years ago   augmentation.   IR IMAGING GUIDED PORT INSERTION  09/06/2020   LAPAROTOMY   08/09/2020   Procedure: EXPLORATORY LAPAROTOMY, MASS EXCISION, PERITONEAL BIOPSIES;  Surgeon: [name], MD;  Location: [facility];  Service: Gynecology;;   OMENTECTOMY N/A 08/09/2020   Procedure: OMENTECTOMY;  Surgeon: [name], MD;  Location: [facility];  Service: Gynecology;  Laterality: N/A;   SALPINGOOPHORECTOMY Right 08/09/2020   Procedure: OPEN RIGHT SALPINGO OOPHORECTOMY;  Surgeon: [name], MD;  Location: [facility];  Service: Gynecology;  Laterality: Right;   VAGINAL HYSTERECTOMY  2015    I have reviewed the social history and family history with the patient and they are unchanged from previous note.  ALLERGIES:  is allergic to codeine and wellbutrin [bupropion].  MEDICATIONS:  Current Outpatient Medications  Medication Sig Dispense Refill   acetaminophen (TYLENOL) 325 MG tablet Take by mouth every 6 (six) hours as needed for mild pain.     ALPRAZolam (XANAX) 0.25 MG tablet Take 1 tablet (0.25 mg total) by mouth 2 (two) times daily as needed for anxiety. 20 tablet 0   capecitabine (XELODA) 500 MG tablet Take 3 tablets (1,500 mg total) by mouth 2 (two) times daily after a meal. Take for 14 days on, then 7 days off. Repeat every 21 days. 84 tablet 1   cholecalciferol (VITAMIN D3) 25 MCG (1000 UNIT) tablet Take 1,000 Units by mouth daily.     diphenhydrAMINE (BENADRYL) 25 mg capsule Take 25 mg by mouth at bedtime as needed for allergies.     gabapentin (NEURONTIN) 100 MG capsule Take 1-2 capsules (100-200 mg total) by mouth 2 (two) times daily. 90 capsule 0   ibuprofen (ADVIL) 800 MG tablet Take 1 tablet (800 mg total) by mouth every 8 (eight) hours as needed for moderate pain. For AFTER surgery only 30 tablet 0   Ibuprofen-diphenhydrAMINE HCl (IBUPROFEN PM) 200-25 MG CAPS Take 1 tablet by mouth at bedtime as needed (sleep).     ipratropium (ATROVENT) 0.03 % nasal spray Place 2 sprays into both nostrils every 12 (twelve) hours. 30 mL 0   lidocaine-prilocaine (EMLA)  cream Apply to affected area once 30 g 3   Multiple Vitamin (MULTIVITAMIN) tablet Take 1 tablet by mouth daily.     ondansetron (ZOFRAN) 8 MG tablet Take 1 tablet (8 mg total) by mouth 2 (two) times daily as needed for refractory nausea / vomiting. Start on day 3 after chemotherapy. 30 tablet 1   oxyCODONE (OXY IR/ROXICODONE) 5 MG immediate release tablet Take 1 tablet (5 mg total) by mouth every 4 (four) hours as needed for severe pain. For severe pain only, do not take and drive 20 tablet 0   prochlorperazine (COMPAZINE) 10 MG tablet Take 1 tablet (10 mg total) by mouth every 6 (six) hours as needed (Nausea or vomiting). 30 tablet 1   Psyllium (METAMUCIL PO) Take 1 Dose by mouth daily.     senna-docusate (SENOKOT-S) 8.6-50 MG tablet Take 2 tablets  by mouth at bedtime. Do not take if having diarrhea (Patient taking differently: Take 2 tablets by mouth daily. Do not take if having diarrhea) 60 tablet 0   Tetrahydrozoline HCl (VISINE OP) Place 1 drop into both eyes daily as needed (itching Daralene Milch).     vitamin B-12 (CYANOCOBALAMIN) 1000 MCG tablet Take 1,000 mcg by mouth daily.     vitamin C (ASCORBIC ACID) 500 MG tablet Take 500 mg by mouth daily.     Vitamin D, Ergocalciferol, (DRISDOL) 1.25 MG (50000 UT) CAPS capsule Take one tablet wkly 12 capsule 3   vitamin E 180 MG (400 UNITS) capsule Take 400 Units by mouth daily.     No current facility-administered medications for this visit.    PHYSICAL EXAMINATION: ECOG PERFORMANCE STATUS: 2 - Symptomatic, <50% confined to bed  Vitals:   02/09/21 0821  BP: 92/62  Pulse: 80  Resp: 19  Temp: 98.4 F (36.9 C)  SpO2: 97%   Wt Readings from Last 3 Encounters:  02/09/21 133 lb 14.4 oz (60.7 kg)  01/26/21 130 lb 8 oz (59.2 kg)  12/29/20 130 lb 8 oz (59.2 kg)     GENERAL:alert, no distress and comfortable SKIN: skin color normal, no rashes or significant lesions EYES: normal, Conjunctiva are pink and non-injected, sclera clear  NEURO: alert  & oriented x 3 with fluent speech  LABORATORY DATA:  I have reviewed the data as listed CBC Latest Ref Rng & Units 02/09/2021 01/26/2021 12/29/2020  WBC 4.0 - 10.5 K/uL 3.1(L) 5.9 4.6  Hemoglobin 12.0 - 15.0 g/dL 12.1 12.3 12.4  Hematocrit 36.0 - 46.0 % 36.1 37.1 36.9  Platelets 150 - 400 K/uL 177 202 143(L)     CMP Latest Ref Rng & Units 02/09/2021 01/26/2021 12/29/2020  Glucose 70 - 99 mg/dL 106(H) 89 84  BUN 6 - 20 mg/dL _0 Creatinine 0.44 - 1.00 mg/dL 0.62 0.61 0.64  Sodium 135 - 145 mmol/L 142 140 141  Potassium 3.5 - 5.1 mmol/L 3.9 4.4 4.1  Chloride 98 - 111 mmol/L 110 107 107  CO2 22 - 32 mmol/L _1 Calcium 8.9 - 10.3 mg/dL 8.7(L) 9.3 9.3  Total Protein 6.5 - 8.1 g/dL 6.8 7.0 6.9  Total Bilirubin 0.3 - 1.2 mg/dL 0.3 0.4 0.3  Alkaline Phos 38 - 126 U/L 149(H) 133(H) 187(H)  AST 15 - 41 U/L 35 23 38  ALT 0 - 44 U/L 31 20 38      RADIOGRAPHIC STUDIES: I have personally reviewed the radiological images as listed and agreed with the findings in the report. No results found.    No orders of the defined types were placed in this encounter.  All questions were answered. The patient knows to call the clinic with any problems, questions or concerns. No barriers to learning was detected. The total time spent in the appointment was 30 minutes.     [name], MD 02/09/2021   I, Wilburn Mylar, am acting as scribe for [name], MD.   I have reviewed the above documentation for accuracy and completeness, and I agree with the above.

## 2021-03-02 ENCOUNTER — Encounter: Payer: Self-pay | Admitting: Hematology

## 2021-03-02 ENCOUNTER — Other Ambulatory Visit: Payer: Self-pay

## 2021-03-02 ENCOUNTER — Inpatient Hospital Stay: Payer: 59 | Attending: Gynecologic Oncology

## 2021-03-02 ENCOUNTER — Inpatient Hospital Stay (HOSPITAL_BASED_OUTPATIENT_CLINIC_OR_DEPARTMENT_OTHER): Payer: 59 | Admitting: Hematology

## 2021-03-02 ENCOUNTER — Other Ambulatory Visit: Payer: Self-pay | Admitting: Hematology

## 2021-03-02 VITALS — BP 107/69 | HR 78 | Temp 97.8°F | Resp 19 | Ht 68.0 in | Wt 132.1 lb

## 2021-03-02 DIAGNOSIS — G629 Polyneuropathy, unspecified: Secondary | ICD-10-CM | POA: Insufficient documentation

## 2021-03-02 DIAGNOSIS — C169 Malignant neoplasm of stomach, unspecified: Secondary | ICD-10-CM | POA: Insufficient documentation

## 2021-03-02 DIAGNOSIS — C7951 Secondary malignant neoplasm of bone: Secondary | ICD-10-CM | POA: Diagnosis not present

## 2021-03-02 DIAGNOSIS — Z95828 Presence of other vascular implants and grafts: Secondary | ICD-10-CM

## 2021-03-02 DIAGNOSIS — C7961 Secondary malignant neoplasm of right ovary: Secondary | ICD-10-CM

## 2021-03-02 DIAGNOSIS — C801 Malignant (primary) neoplasm, unspecified: Secondary | ICD-10-CM | POA: Diagnosis not present

## 2021-03-02 LAB — CMP (CANCER CENTER ONLY)
ALT: 22 U/L (ref 0–44)
AST: 28 U/L (ref 15–41)
Albumin: 3.8 g/dL (ref 3.5–5.0)
Alkaline Phosphatase: 239 U/L — ABNORMAL HIGH (ref 38–126)
Anion gap: 9 (ref 5–15)
BUN: 10 mg/dL (ref 6–20)
CO2: 24 mmol/L (ref 22–32)
Calcium: 8.9 mg/dL (ref 8.9–10.3)
Chloride: 107 mmol/L (ref 98–111)
Creatinine: 0.59 mg/dL (ref 0.44–1.00)
GFR, Estimated: >60 mL/min (ref >60)
Glucose, Bld: 89 mg/dL (ref 70–99)
Potassium: 4.1 mmol/L (ref 3.5–5.1)
Sodium: 140 mmol/L (ref 135–145)
Total Bilirubin: 0.4 mg/dL (ref 0.3–1.2)
Total Protein: 7 g/dL (ref 6.5–8.1)

## 2021-03-02 LAB — CBC WITH DIFFERENTIAL (CANCER CENTER ONLY)
Abs Immature Granulocytes: 0.01 10*3/uL (ref 0.00–0.07)
Basophils Absolute: 0 10*3/uL (ref 0.0–0.1)
Basophils Relative: 0 %
Eosinophils Absolute: 0.1 10*3/uL (ref 0.0–0.5)
Eosinophils Relative: 1 %
HCT: 35.9 % — ABNORMAL LOW (ref 36.0–46.0)
Hemoglobin: 12 g/dL (ref 12.0–15.0)
Immature Granulocytes: 0 %
Lymphocytes Relative: 29 %
Lymphs Abs: 1.3 10*3/uL (ref 0.7–4.0)
MCH: 33.3 pg (ref 26.0–34.0)
MCHC: 33.4 g/dL (ref 30.0–36.0)
MCV: 99.7 fL (ref 80.0–100.0)
Monocytes Absolute: 0.7 10*3/uL (ref 0.1–1.0)
Monocytes Relative: 15 %
Neutro Abs: 2.4 10*3/uL (ref 1.7–7.7)
Neutrophils Relative %: 55 %
Platelet Count: 193 10*3/uL (ref 150–400)
RBC: 3.6 MIL/uL — ABNORMAL LOW (ref 3.87–5.11)
RDW: 16.6 % — ABNORMAL HIGH (ref 11.5–15.5)
WBC Count: 4.5 10*3/uL (ref 4.0–10.5)
nRBC: 0 % (ref 0.0–0.2)

## 2021-03-02 MED ORDER — HEPARIN SOD (PORK) LOCK FLUSH 100 UNIT/ML IV SOLN
500.0000 [IU] | Freq: Once | INTRAVENOUS | Status: AC
  Administered 2021-03-02: 500 [IU]

## 2021-03-02 MED ORDER — UREA 10 % EX CREA: TOPICAL_CREAM | Freq: Two times a day (BID) | CUTANEOUS | 0 refills | Status: DC

## 2021-03-02 MED ORDER — SODIUM CHLORIDE 0.9% FLUSH
10.0000 mL | Freq: Once | INTRAVENOUS | Status: AC
  Administered 2021-03-02: 10 mL

## 2021-03-02 MED ORDER — CAPECITABINE 500 MG PO TABS: ORAL_TABLET | ORAL | 1 refills | Status: DC

## 2021-03-02 NOTE — Progress Notes (Signed)
[place]   [phone] [phone]) [phone]   Clinic Follow up Note   Patient Care Team: Lorrene Reid, PA-C as PCP - General Christene Lye, MD (General Surgery) [facility], [place] [name], MD as Consulting Physician (Hematology and Oncology) [name], RN (Inactive) as Oncology Nurse Navigator [name], MD as Consulting Physician (Gynecologic Oncology)  Date of Service:  03/02/2021  CHIEF COMPLAINT: f/u of metastatic gastric adenocarcinoma  CURRENT THERAPY:  First line chemo FOLFOX every 2 weeks, change to Xeloda 1537m q12h on day 1-14 every 21 days on 02/09/21  ASSESSMENT & PLAN:  Sheryl PICKELSIMERis a 55y.o. female with   1. 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 Enlarged left adrenal gland.    -On 08/09/20, she underwent diagnostic laparoscopy with right salpingo-oophorectomy to assure the origin of the mass. The large ovarian mass showed adenocarcinoma with signet ring cell features, IHC studies show liver upper GI primary. Biopsy of other pelvic tissue was negative for metastasis. -She underwent EGD with Dr. JArdis Hughson 08/26/20. Final pathology showed poorly differentiated adenofibroma with signet rings features. This is the same as her ovarian pathology.  --FO results showed MSI stable disease with low mutation rate, no targetable mutation. HER2(-). Her PD-L1 was negative, so she is not a candidate for immunotherapy as first-line. -I started her on first-line chemo with FOLFOX every 2 weeks on 09/08/20. This was held while she was receiving palliative radiation. Dose reduced with C5 due to developing neuropathy.  -restaging CT CAP and bone scan on 12/28/20 showed probable improvement to skeletal  lesions (more sclerotic lesions on CT). -cycle 8 was held due to increased side effects then given at a further lowered dose.  -she was switched to maintenance Xeloda at her last visit on 02/09/21. She is tolerating well with only skin toxicity. I refilled today. -continue f/u in 3 weeks  2. Skin Toxicity -secondary to Xeloda -I prescribed urea cream today.   3. Neuropathy G1 -she is currently taking B12 -She was prescribed gabapentin on 12/01/20, which has provided relief.   4. Pulmonary nodules -first seen on CT CAP 12/2020, most of which are sub-solid -she has a history of smoking, and only smokes an occasional cigarette now. -will monitor on future scans   5. Lower back pain secondary to bone mets, muscular/nerve pains -Improved with palliative radiation therapy to the right hip and left pelvis 09/30/20 through 10/26/20. -She began Zometa on 12/29/20. -she reports her right hip and leg pain has worsened in the last month. I advised her to increase her gabapentin to two times at night. I will also call in oxycodone for her to use more frequently. -I recommend she try to exercise, especially with things like a stationary bike that will take pressure off her hip.   6. Social Support, h/o smoking  -[place]. They plan to retire there.  -She reports anxiety related to her treatments. I prescribed Xanax on 11/17/20. -she reports her insurance does not cover much in terms of her treatments. She notes she has met with our financial counselor and was told her husband makes too much money for them to qualify for any assistance. -she notes nicotine craving when she's stressed. She asked  if she could use nicotine gum. I advised her that if this keeps her from smoking, she can use it. I recommend she use it sparingly.   7. Family History of Cancer  -The patient's brother had pancreatic cancer, diagnosed at age 29. She notes unknown  cancer in another family member. -Genetic testing done 09/08/20 was negative, with VUS in MLH1 and LZTR1.     PLAN: -I prescribed urea cream and refilled Xeloda -continue Xeloda, start cycle 2 tomorrow, 03/03/21 -lab, flush, and f/u in 3 weeks.    No problem-specific Assessment & Plan notes found for this encounter.   SUMMARY OF ONCOLOGIC HISTORY: Oncology History Overview Note  Cancer Staging Gastric adenocarcinoma [facility]) Staging form: Stomach, AJCC 8th Edition - Clinical stage from 08/26/2020: Stage IVB (cTX, cN0, pM1) - Signed by [name], MD on 09/06/2020 Stage prefix: Initial diagnosis Total positive nodes: 0 Histologic grade (G): G3 Histologic grading system: 3 grade system    Gastric adenocarcinoma ([place])  07/30/2020 Imaging   CT Abdomen and Pelvis with Contrast  IMPRESSION: 1. Large hypervascular soft tissue mass occupies the pelvis, compressing the urinary bladder and GI structures. No definite connection of this mass is seen to the vaginal cuff. The ovaries are not identified. 2. Enlarged left adrenal gland. Given the presence of soft tissue mass in the pelvis, this may represent metastatic disease to the adrenal gland. 3. Further evaluation with MRI of the abdomen and pelvis may be considered. Surgical consultation is also advised. 4. Right lower pole nonobstructive nephrolithiasis. 5. Aortic atherosclerosis.     08/09/2020 Procedure   Exploratory laparotomy with right salpingo-oophorectomy, omentectomy washings, peritoneal biopsies, and omentectomy under the care of Dr. Berline Lopes    08/09/2020 Pathology Results   FINAL MICROSCOPIC DIAGNOSIS:   A. ADNEXAL MASS, RIGHT, SALPINGO OOPHORECTOMY:  - Adenocarcinoma with signet ring cell features.  - No ovarian surface involvement identified.  - Benign Fallopian tube.  - See comment.   The right adnexal mass is 17.8 cm in greatest dimension and is diffusely involved by adenocarcinoma with diffuse signet ring cell  features. Immunohistochemistry shows positivity with cytokeratin 7 and cytokeratin 20.  The tumor is negative with estrogen receptor, GATA3, GCDFP, CD56, chromogranin, synaptic ficin, calretinin, inhibin, Napsin A, TTF-1, WT1 and PAX 8.  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  Possible primary sites  include upper gastrointestinal tract and pancreas.   08/19/2020 Initial Diagnosis   Adenocarcinoma ([place])   08/26/2020 Procedure   Upper Endoscopy by Dr Ardis Hughs  IMPRESSION - There were no overtly neoplastic findings. - There was mild, non-specific gastritis and bulbar duodenitis. Both sites were biopsied given large pelvic mass pathology.   08/26/2020 Pathology Results   Surgical [P], gastric antrum and gastric body bx - POORLY DIFFERENTIATED ADENOCARCINOMA WITH SIGNET RING FEATURES. - SEE MICROSCOPIC DESCRIPTION.    08/26/2020 Cancer Staging   Staging form: Stomach, AJCC 8th Edition - Clinical stage from 08/26/2020: Stage IVB (cTX, cN0, pM1) - Signed by [name], MD on 09/06/2020 Stage prefix: Initial diagnosis Total positive nodes: 0 Histologic grade (G): G3 Histologic grading system: 3 grade system    08/29/2020 PET scan   IMPRESSION: 1. Dominant finding is multifocal hypermetabolic skeletal metastasis. Lesions are subtly evident on CT with subtle sclerosis. Lesions include the pelvis, spine and ribs. 2. Hypermetabolic metastasis in the RIGHT femoral neck with sclerosis may be at risk for pathologic fracture. 3. Resection of large pelvic mass. No residual carcinoma evident  within the pelvis. 4. Benign LEFT adrenal adenoma. 5. Diffuse mild metabolic activity in the gastric fundus and proximal body is nonspecific. Recommend correlation with recent upper endoscopy.     09/08/2020 -  Chemotherapy   First-line FOLFOX q2weeks starting 09/08/20. Held 09/30/20 to proceed with palliative radiation. Restarted 11/03/20    09/26/2020  Genetic Testing   Negative hereditary cancer genetic testing: no pathogenic variants detected in [facility] CustomNext-Cancer +RNAinsight Panel.  Variants of uncertain significance detected in LZTR1 at  p.K452E (c.1354A>G) and MLH1 at p.P747S (c.2239C>T).  The report date is September 21, 2020.   The CustomNext-Cancer +RNAinsight Panel offered by [facility] and includes sequencing and rearrangement analysis for the following 91 genes: AIP, ALK, APC, ATM, AXIN2, BAP1, BARD1, BLM, BMPR1A, BRCA1, BRCA2, BRIP1, CDC73, CDH1, CDK4, CDKN1B, CDKN2A, CHEK2, CTNNA1, DICER1, FANCC, FH, FLCN, GALNT12, KIF1B, LZTR1, MAX, MEN1, MET, MLH1, MRE11A, MSH2, MSH3, MSH6, MUTYH, NBN, NF1, NF2, NTHL1, PALB2, PHOX2B, PMS2, POT1, PRKAR1A, PTCH1, PTEN, RAD50, RAD51C, RAD51D, RB1, RECQL, RET, SDHA, SDHAF2, SDHB, SDHC, SDHD, SMAD4, SMARCA4, SMARCB1, SMARCE1, STK11, SUFU, TMEM127, TP53, TSC1, TSC2, VHL and XRCC2 (sequencing and deletion/duplication); CASR, CFTR, CPA1, CTRC, EGFR, EGLN1, FAM175A, HOXB13, KIT, MITF, MLH3, PALLD, PDGFRA, POLD1, POLE, PRSS1, RINT1, RPS20, SPINK1 and TERT (sequencing only); EPCAM and GREM1 (deletion/duplication only). RNA data is routinely analyzed for use in variant interpretation for all genes.   10/04/2020 - 10/26/2020 Radiation Therapy   Palliative Radiation to right hip/lumbar spine with Dr Lisbeth Renshaw   Site Technique Total Dose (Gy) Dose per Fx (Gy) Completed Fx Beam Energies  Femur Right: Ext_Rt Complex 37.5/37.5 2.5 15/15 15X  Pelvis: Pelvis_Left SI 3D 37.5/37.5 2.5 15/15 10X, 15X    12/28/2020 Imaging   CT CAP  IMPRESSION: 1. Extensive and diffuse sclerotic metastatic bone disease likely reflecting healing changes (lesions were largely lytic on the prior PET-CT). No pathologic fractures or spinal canal compromise. 2. No findings for abdominal/pelvic metastatic disease. 3. Numerous bilateral subsolid/ground-glass pulmonary nodules will require attention on future scans. 4. Stable left adrenal gland  adenoma.   Aortic Atherosclerosis (ICD10-I70.0) and Emphysema (ICD10-J43.9).   12/28/2020 Imaging   Bone Scan  IMPRESSION: Multifocal radiotracer accumulation in the axial and appendicular skeleton consistent with metastatic disease. Relatively diffuse uptake is identified in the right femoral neck, compatible with the sclerotic lesion seen on the 12/28/2020 CT exam.      INTERVAL HISTORY:  Sheryl Pratt is here for a follow up of metastatic gastric cancer. She was last seen by me on 02/09/21. She presents to the clinic alone. She reports she completed her first cycle of Xeloda last Thursday. She reports she tolerated very well with mostly skin toxicity. She notes redness to her hands. She reports she has more energy than she did previously. She notes she has had cravings for smoking and asked if she could use nicotine gum to curb this.   All other systems were reviewed with the patient and are negative.  MEDICAL HISTORY:  Past Medical History:  Diagnosis Date   Allergy    Arthritis    Bloating    Breast mass, right    Cancer ([place])    metastaic to bones and abd, May 2022   Constipation    Family history of gastric cancer 09/08/2020   Family history of ovarian cancer 09/08/2020   Family history of pancreatic cancer 09/08/2020   Family history of skin cancer 09/08/2020   Migraine    Pelvic mass    PONV (postoperative nausea and  vomiting)    Seasonal allergies    Vitamin D deficiency     SURGICAL HISTORY: Past Surgical History:  Procedure Laterality Date   APPENDECTOMY     AUGMENTATION MAMMAPLASTY Bilateral    BREAST BIOPSY Right 2014   fibroadenoma   BREAST SURGERY  18 years ago   augmentation.   IR IMAGING GUIDED PORT INSERTION  09/06/2020   LAPAROTOMY  08/09/2020   Procedure: EXPLORATORY LAPAROTOMY, MASS EXCISION, PERITONEAL BIOPSIES;  Surgeon: [name], MD;  Location: [facility];  Service: Gynecology;;   OMENTECTOMY N/A 08/09/2020   Procedure: OMENTECTOMY;   Surgeon: [name], MD;  Location: [facility];  Service: Gynecology;  Laterality: N/A;   SALPINGOOPHORECTOMY Right 08/09/2020   Procedure: OPEN RIGHT SALPINGO OOPHORECTOMY;  Surgeon: [name], MD;  Location: [facility];  Service: Gynecology;  Laterality: Right;   VAGINAL HYSTERECTOMY  2015    I have reviewed the social history and family history with the patient and they are unchanged from previous note.  ALLERGIES:  is allergic to codeine and wellbutrin [bupropion].  MEDICATIONS:  Current Outpatient Medications  Medication Sig Dispense Refill   urea (CARMOL) 10 % cream Apply topically 2 (two) times daily. To palms and bottom of feet 71 g 0   acetaminophen (TYLENOL) 325 MG tablet Take by mouth every 6 (six) hours as needed for mild pain.     ALPRAZolam (XANAX) 0.25 MG tablet Take 1 tablet (0.25 mg total) by mouth 2 (two) times daily as needed for anxiety. 20 tablet 0   capecitabine (XELODA) 500 MG tablet TAKE 3 TABLETS BY MOUTH  TWICE DAILY AFTER A MEAL  FOR 14 DAYS ON, THEN 7 DAYS OFF. REPEAT EVERY 21 DAYS 84 tablet 1   cholecalciferol (VITAMIN D3) 25 MCG (1000 UNIT) tablet Take 1,000 Units by mouth daily.     diphenhydrAMINE (BENADRYL) 25 mg capsule Take 25 mg by mouth at bedtime as needed for allergies.     gabapentin (NEURONTIN) 100 MG capsule Take 1-2 capsules (100-200 mg total) by mouth 2 (two) times daily. 90 capsule 0   ibuprofen (ADVIL) 800 MG tablet Take 1 tablet (800 mg total) by mouth every 8 (eight) hours as needed for moderate pain. For AFTER surgery only 30 tablet 0   Ibuprofen-diphenhydrAMINE HCl (IBUPROFEN PM) 200-25 MG CAPS Take 1 tablet by mouth at bedtime as needed (sleep).     ipratropium (ATROVENT) 0.03 % nasal spray Place 2 sprays into both nostrils every 12 (twelve) hours. 30 mL 0   lidocaine-prilocaine (EMLA) cream Apply to affected area once 30 g 3   Multiple Vitamin (MULTIVITAMIN) tablet Take 1 tablet by mouth daily.     ondansetron (ZOFRAN) 8 MG tablet  Take 1 tablet (8 mg total) by mouth 2 (two) times daily as needed for refractory nausea / vomiting. Start on day 3 after chemotherapy. 30 tablet 1   oxyCODONE (OXY IR/ROXICODONE) 5 MG immediate release tablet Take 1 tablet (5 mg total) by mouth every 4 (four) hours as needed for severe pain. For severe pain only, do not take and drive 20 tablet 0   prochlorperazine (COMPAZINE) 10 MG tablet Take 1 tablet (10 mg total) by mouth every 6 (six) hours as needed (Nausea or vomiting). 30 tablet 1   Psyllium (METAMUCIL PO) Take 1 Dose by mouth daily.     senna-docusate (SENOKOT-S) 8.6-50 MG tablet Take 2 tablets by mouth at bedtime. Do not take if having diarrhea (Patient taking differently: Take 2 tablets by  mouth daily. Do not take if having diarrhea) 60 tablet 0   Tetrahydrozoline HCl (VISINE OP) Place 1 drop into both eyes daily as needed (itching Daralene Milch).     vitamin B-12 (CYANOCOBALAMIN) 1000 MCG tablet Take 1,000 mcg by mouth daily.     vitamin C (ASCORBIC ACID) 500 MG tablet Take 500 mg by mouth daily.     Vitamin D, Ergocalciferol, (DRISDOL) 1.25 MG (50000 UT) CAPS capsule Take one tablet wkly 12 capsule 3   vitamin E 180 MG (400 UNITS) capsule Take 400 Units by mouth daily.     No current facility-administered medications for this visit.    PHYSICAL EXAMINATION: ECOG PERFORMANCE STATUS: 1 - Symptomatic but completely ambulatory  Vitals:   03/02/21 1317  BP: 107/69  Pulse: 78  Resp: 19  Temp: 97.8 F (36.6 C)  SpO2: 98%   Wt Readings from Last 3 Encounters:  03/02/21 132 lb 1.6 oz (59.9 kg)  02/09/21 133 lb 14.4 oz (60.7 kg)  01/26/21 130 lb 8 oz (59.2 kg)     GENERAL:alert, no distress and comfortable SKIN: skin color normal, no rashes or significant lesions EYES: normal, Conjunctiva are pink and non-injected, sclera clear  NEURO: alert & oriented x 3 with fluent speech  LABORATORY DATA:  I have reviewed the data as listed CBC Latest Ref Rng & Units 03/02/2021 02/09/2021  01/26/2021  WBC 4.0 - 10.5 K/uL 4.5 3.1(L) 5.9  Hemoglobin 12.0 - 15.0 g/dL 12.0 12.1 12.3  Hematocrit 36.0 - 46.0 % 35.9(L) 36.1 37.1  Platelets 150 - 400 K/uL 193 177 202     CMP Latest Ref Rng & Units 03/02/2021 02/09/2021 01/26/2021  Glucose 70 - 99 mg/dL 89 106(H) 89  BUN 6 - 20 mg/dL _0 Creatinine 0.44 - 1.00 mg/dL 0.59 0.62 0.61  Sodium 135 - 145 mmol/L 140 142 140  Potassium 3.5 - 5.1 mmol/L 4.1 3.9 4.4  Chloride 98 - 111 mmol/L 107 110 107  CO2 22 - 32 mmol/L _1 Calcium 8.9 - 10.3 mg/dL 8.9 8.7(L) 9.3  Total Protein 6.5 - 8.1 g/dL 7.0 6.8 7.0  Total Bilirubin 0.3 - 1.2 mg/dL 0.4 0.3 0.4  Alkaline Phos 38 - 126 U/L 239(H) 149(H) 133(H)  AST 15 - 41 U/L 28 35 23  ALT 0 - 44 U/L _2 RADIOGRAPHIC STUDIES: I have personally reviewed the radiological images as listed and agreed with the findings in the report. No results found.    No orders of the defined types were placed in this encounter.  All questions were answered. The patient knows to call the clinic with any problems, questions or concerns. No barriers to learning was detected. The total time spent in the appointment was 30 minutes.     [name], MD 03/02/2021   I, Wilburn Mylar, am acting as scribe for [name], MD.   I have reviewed the above documentation for accuracy and completeness, and I agree with the above.

## 2021-03-15 ENCOUNTER — Telehealth: Payer: Self-pay

## 2021-03-15 ENCOUNTER — Other Ambulatory Visit: Payer: Self-pay

## 2021-03-15 DIAGNOSIS — R238 Other skin changes: Secondary | ICD-10-CM

## 2021-03-15 MED ORDER — UREA 10 % EX CREA: TOPICAL_CREAM | Freq: Two times a day (BID) | CUTANEOUS | 0 refills | Status: AC

## 2021-03-15 NOTE — Telephone Encounter (Signed)
This nurse received a message from this patient stating that MD was supposed to send in a prescription for cream for her hands and feet however the pharmacy stated that they did not receive it.  This nurse contacted [facility] and was advised to tell the patient that it is an over the counter cream and if they run it through the insurance it will cost more.  Pharmacist also stated that they were out of the cream and he can order it but it may not come until Friday.  Patient acknowledged understanding, asked for the name of the cream and stated that she would try to find it at [facility] or [facility].  No further questions or concerns at this time.

## 2021-03-24 ENCOUNTER — Inpatient Hospital Stay: Payer: 59 | Attending: Gynecologic Oncology | Admitting: Hematology

## 2021-03-24 ENCOUNTER — Other Ambulatory Visit: Payer: Self-pay

## 2021-03-24 ENCOUNTER — Inpatient Hospital Stay: Payer: 59

## 2021-03-24 VITALS — BP 92/66 | HR 80 | Temp 98.5°F | Resp 17 | Ht 68.0 in | Wt 131.4 lb

## 2021-03-24 DIAGNOSIS — C786 Secondary malignant neoplasm of retroperitoneum and peritoneum: Secondary | ICD-10-CM

## 2021-03-24 DIAGNOSIS — G629 Polyneuropathy, unspecified: Secondary | ICD-10-CM | POA: Diagnosis not present

## 2021-03-24 DIAGNOSIS — G893 Neoplasm related pain (acute) (chronic): Secondary | ICD-10-CM | POA: Diagnosis not present

## 2021-03-24 DIAGNOSIS — C7961 Secondary malignant neoplasm of right ovary: Secondary | ICD-10-CM

## 2021-03-24 DIAGNOSIS — C7951 Secondary malignant neoplasm of bone: Secondary | ICD-10-CM | POA: Diagnosis not present

## 2021-03-24 DIAGNOSIS — C801 Malignant (primary) neoplasm, unspecified: Secondary | ICD-10-CM | POA: Diagnosis not present

## 2021-03-24 DIAGNOSIS — C169 Malignant neoplasm of stomach, unspecified: Secondary | ICD-10-CM | POA: Insufficient documentation

## 2021-03-24 DIAGNOSIS — Z95828 Presence of other vascular implants and grafts: Secondary | ICD-10-CM

## 2021-03-24 LAB — CBC WITH DIFFERENTIAL (CANCER CENTER ONLY)
Abs Immature Granulocytes: 0.01 10*3/uL (ref 0.00–0.07)
Basophils Absolute: 0 10*3/uL (ref 0.0–0.1)
Basophils Relative: 0 %
Eosinophils Absolute: 0.1 10*3/uL (ref 0.0–0.5)
Eosinophils Relative: 2 %
HCT: 34.9 % — ABNORMAL LOW (ref 36.0–46.0)
Hemoglobin: 12 g/dL (ref 12.0–15.0)
Immature Granulocytes: 0 %
Lymphocytes Relative: 20 %
Lymphs Abs: 0.9 10*3/uL (ref 0.7–4.0)
MCH: 34.8 pg — ABNORMAL HIGH (ref 26.0–34.0)
MCHC: 34.4 g/dL (ref 30.0–36.0)
MCV: 101.2 fL — ABNORMAL HIGH (ref 80.0–100.0)
Monocytes Absolute: 0.5 10*3/uL (ref 0.1–1.0)
Monocytes Relative: 12 %
Neutro Abs: 2.9 10*3/uL (ref 1.7–7.7)
Neutrophils Relative %: 66 %
Platelet Count: 200 10*3/uL (ref 150–400)
RBC: 3.45 MIL/uL — ABNORMAL LOW (ref 3.87–5.11)
RDW: 19.3 % — ABNORMAL HIGH (ref 11.5–15.5)
WBC Count: 4.5 10*3/uL (ref 4.0–10.5)
nRBC: 0 % (ref 0.0–0.2)

## 2021-03-24 LAB — CMP (CANCER CENTER ONLY)
ALT: 18 U/L (ref 0–44)
AST: 24 U/L (ref 15–41)
Albumin: 3.8 g/dL (ref 3.5–5.0)
Alkaline Phosphatase: 197 U/L — ABNORMAL HIGH (ref 38–126)
Anion gap: 7 (ref 5–15)
BUN: 11 mg/dL (ref 6–20)
CO2: 24 mmol/L (ref 22–32)
Calcium: 8.8 mg/dL — ABNORMAL LOW (ref 8.9–10.3)
Chloride: 110 mmol/L (ref 98–111)
Creatinine: 0.63 mg/dL (ref 0.44–1.00)
GFR, Estimated: >60 mL/min (ref >60)
Glucose, Bld: 98 mg/dL (ref 70–99)
Potassium: 4.3 mmol/L (ref 3.5–5.1)
Sodium: 141 mmol/L (ref 135–145)
Total Bilirubin: 0.4 mg/dL (ref 0.3–1.2)
Total Protein: 6.8 g/dL (ref 6.5–8.1)

## 2021-03-24 MED ORDER — GABAPENTIN 100 MG PO CAPS: 200.0000 mg | ORAL_CAPSULE | Freq: Two times a day (BID) | ORAL | 1 refills | Status: AC

## 2021-03-24 MED ORDER — SODIUM CHLORIDE 0.9% FLUSH
10.0000 mL | Freq: Once | INTRAVENOUS | Status: AC
  Administered 2021-03-24: 10 mL

## 2021-03-24 MED ORDER — CAPECITABINE 500 MG PO TABS: ORAL_TABLET | ORAL | 1 refills | Status: DC

## 2021-03-24 MED ORDER — HEPARIN SOD (PORK) LOCK FLUSH 100 UNIT/ML IV SOLN
500.0000 [IU] | Freq: Once | INTRAVENOUS | Status: AC
  Administered 2021-03-24: 500 [IU]

## 2021-03-24 NOTE — Progress Notes (Signed)
[name]   [phone]) [phone] [phone]) [phone]   Clinic Follow up Note   Patient Care Team: Lorrene Reid, PA-C as PCP - General Christene Lye, MD (General Surgery) [facility], [place] [name], MD as Consulting Physician (Hematology and Oncology) [name], RN (Inactive) as Oncology Nurse Navigator [name], MD as Consulting Physician (Gynecologic Oncology)  Date of Service:  03/24/2021  CHIEF COMPLAINT: f/u of metastatic gastric adenocarcinoma  CURRENT THERAPY:  Xeloda 1539m q12h on day 1-14 every 21 days on 02/09/21  ASSESSMENT & PLAN:  Sheryl HEMRICKis a 55y.o. female with   1. 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 Enlarged left adrenal gland.    -On 08/09/20, she underwent diagnostic laparoscopy with right salpingo-oophorectomy to assure the origin of the mass. The large ovarian mass showed adenocarcinoma with signet ring cell features, IHC studies show liver upper GI primary. Biopsy of other pelvic tissue was negative for metastasis. -She underwent EGD with Dr. JArdis Hughson 08/26/20. Final pathology showed poorly differentiated adenofibroma with signet rings features. This is the same as her ovarian pathology.  --FO results showed MSI stable disease with low mutation rate, no targetable mutation. HER2(-). Her PD-L1 was negative, so she is not a candidate for immunotherapy as first-line. -I started her on first-line chemo with FOLFOX every 2 weeks on 09/08/20. This was held while she was receiving palliative radiation. Dose reduced with C5 due to developing neuropathy.  -restaging CT CAP and bone scan on 12/28/20 showed probable improvement to skeletal lesions (more sclerotic lesions on CT). -cycle 8 was  held due to increased side effects then given at a further lowered dose.  -she was switched to maintenance Xeloda on 02/09/21. She is tolerating well with only skin toxicity. I refilled today. -continue f/u in 3 weeks, will plan for restaging CT/bone scan prior to her 05/04/21 visit   2. Skin Toxicity -secondary to Xeloda -her pharmacy advised her to obtain urea cream OTC. She obtained this and has noted much improvement with the cream.   3. Neuropathy G1 -she is currently taking B12 -She was prescribed gabapentin on 12/01/20, which has provided relief. -stable and controlled on gabapentin, I refilled today.   4. Pulmonary nodules, h/o smoking  -she has a history of smoking, and only smokes an occasional cigarette now. She notes nicotine craving when she's stressed and is using nicotine gum. -pulmonary nodules first seen on CT CAP 12/2020, most of which are sub-solid. Will monitor on future scans   5. Lower back pain secondary to bone mets, muscular/nerve pains -Improved with palliative radiation therapy to the right hip and left pelvis 09/30/20 through 10/26/20. -She began Zometa on 12/29/20. -on gabapentin and oxycodone for right hip and leg pain. I refilled her gabapentin today.   6. Social Support -She [place]. They plan to retire there.  -She reports anxiety related to her treatments. I prescribed Xanax on 11/17/20. -she reports her insurance does not cover much in terms of her treatments. She notes she has met with our financial counselor and was told her husband makes too much money for them to qualify for any assistance.   7. Family History of Cancer  -The patient's brother had pancreatic cancer, diagnosed at  age 14. She notes unknown cancer in another family member. -Genetic testing done 09/08/20 was negative, with VUS in MLH1 and LZTR1.     PLAN: -I refilled Xeloda and gabapentin today -continue Xeloda -lab, flush, and  f/u in 3 and 6 weeks.  -restaging CT/bone scan in 5 weeks   No problem-specific Assessment & Plan notes found for this encounter.   SUMMARY OF ONCOLOGIC HISTORY: Oncology History Overview Note  Cancer Staging Gastric adenocarcinoma [facility]) Staging form: Stomach, AJCC 8th Edition - Clinical stage from 08/26/2020: Stage IVB (cTX, cN0, pM1) - Signed by [name], MD on 09/06/2020 Stage prefix: Initial diagnosis Total positive nodes: 0 Histologic grade (G): G3 Histologic grading system: 3 grade system    Gastric adenocarcinoma ([place])  07/30/2020 Imaging   CT Abdomen and Pelvis with Contrast  IMPRESSION: 1. Large hypervascular soft tissue mass occupies the pelvis, compressing the urinary bladder and GI structures. No definite connection of this mass is seen to the vaginal cuff. The ovaries are not identified. 2. Enlarged left adrenal gland. Given the presence of soft tissue mass in the pelvis, this may represent metastatic disease to the adrenal gland. 3. Further evaluation with MRI of the abdomen and pelvis may be considered. Surgical consultation is also advised. 4. Right lower pole nonobstructive nephrolithiasis. 5. Aortic atherosclerosis.     08/09/2020 Procedure   Exploratory laparotomy with right salpingo-oophorectomy, omentectomy washings, peritoneal biopsies, and omentectomy under the care of Dr. Berline Lopes    08/09/2020 Pathology Results   FINAL MICROSCOPIC DIAGNOSIS:   A. ADNEXAL MASS, RIGHT, SALPINGO OOPHORECTOMY:  - Adenocarcinoma with signet ring cell features.  - No ovarian surface involvement identified.  - Benign Fallopian tube.  - See comment.   The right adnexal mass is 17.8 cm in greatest dimension and is diffusely involved by adenocarcinoma with diffuse signet ring cell features. Immunohistochemistry shows positivity with cytokeratin 7 and cytokeratin 20.  The tumor is negative with estrogen receptor, GATA3, GCDFP, CD56, chromogranin, synaptic ficin, calretinin,  inhibin, Napsin A, TTF-1, WT1 and PAX 8.  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  Possible primary sites  include upper gastrointestinal tract and pancreas.   08/19/2020 Initial Diagnosis   Adenocarcinoma ([place])   08/26/2020 Procedure   Upper Endoscopy by Dr Ardis Hughs  IMPRESSION - There were no overtly neoplastic findings. - There was mild, non-specific gastritis and bulbar duodenitis. Both sites were biopsied given large pelvic mass pathology.   08/26/2020 Pathology Results   Surgical [P], gastric antrum and gastric body bx - POORLY DIFFERENTIATED ADENOCARCINOMA WITH SIGNET RING FEATURES. - SEE MICROSCOPIC DESCRIPTION.    08/26/2020 Cancer Staging   Staging form: Stomach, AJCC 8th Edition - Clinical stage from 08/26/2020: Stage IVB (cTX, cN0, pM1) - Signed by [name], MD on 09/06/2020 Stage prefix: Initial diagnosis Total positive nodes: 0 Histologic grade (G): G3 Histologic grading system: 3 grade system    08/29/2020 PET scan   IMPRESSION: 1. Dominant finding is multifocal hypermetabolic skeletal metastasis. Lesions are subtly evident on CT with subtle sclerosis. Lesions include the pelvis, spine and ribs. 2. Hypermetabolic metastasis in the RIGHT femoral neck with sclerosis may be at risk for pathologic fracture. 3. Resection of large pelvic mass. No residual carcinoma evident within the pelvis. 4. Benign LEFT adrenal adenoma. 5. Diffuse mild metabolic activity in the gastric fundus and proximal body is nonspecific. Recommend correlation with recent upper endoscopy.     09/08/2020 -  Chemotherapy   First-line FOLFOX  q2weeks starting 09/08/20. Held 09/30/20 to proceed with palliative radiation. Restarted 11/03/20    09/26/2020 Genetic Testing   Negative hereditary cancer genetic testing: no pathogenic variants detected in [facility] CustomNext-Cancer +RNAinsight Panel.  Variants of uncertain significance detected in LZTR1 at   p.K452E (c.1354A>G) and MLH1 at p.P747S (c.2239C>T).  The report date is September 21, 2020.   The CustomNext-Cancer +RNAinsight Panel offered by [facility] and includes sequencing and rearrangement analysis for the following 91 genes: AIP, ALK, APC, ATM, AXIN2, BAP1, BARD1, BLM, BMPR1A, BRCA1, BRCA2, BRIP1, CDC73, CDH1, CDK4, CDKN1B, CDKN2A, CHEK2, CTNNA1, DICER1, FANCC, FH, FLCN, GALNT12, KIF1B, LZTR1, MAX, MEN1, MET, MLH1, MRE11A, MSH2, MSH3, MSH6, MUTYH, NBN, NF1, NF2, NTHL1, PALB2, PHOX2B, PMS2, POT1, PRKAR1A, PTCH1, PTEN, RAD50, RAD51C, RAD51D, RB1, RECQL, RET, SDHA, SDHAF2, SDHB, SDHC, SDHD, SMAD4, SMARCA4, SMARCB1, SMARCE1, STK11, SUFU, TMEM127, TP53, TSC1, TSC2, VHL and XRCC2 (sequencing and deletion/duplication); CASR, CFTR, CPA1, CTRC, EGFR, EGLN1, FAM175A, HOXB13, KIT, MITF, MLH3, PALLD, PDGFRA, POLD1, POLE, PRSS1, RINT1, RPS20, SPINK1 and TERT (sequencing only); EPCAM and GREM1 (deletion/duplication only). RNA data is routinely analyzed for use in variant interpretation for all genes.   10/04/2020 - 10/26/2020 Radiation Therapy   Palliative Radiation to right hip/lumbar spine with Dr Lisbeth Renshaw   Site Technique Total Dose (Gy) Dose per Fx (Gy) Completed Fx Beam Energies  Femur Right: Ext_Rt Complex 37.5/37.5 2.5 15/15 15X  Pelvis: Pelvis_Left SI 3D 37.5/37.5 2.5 15/15 10X, 15X    12/28/2020 Imaging   CT CAP  IMPRESSION: 1. Extensive and diffuse sclerotic metastatic bone disease likely reflecting healing changes (lesions were largely lytic on the prior PET-CT). No pathologic fractures or spinal canal compromise. 2. No findings for abdominal/pelvic metastatic disease. 3. Numerous bilateral subsolid/ground-glass pulmonary nodules will require attention on future scans. 4. Stable left adrenal gland adenoma.   Aortic Atherosclerosis (ICD10-I70.0) and Emphysema (ICD10-J43.9).   12/28/2020 Imaging   Bone Scan  IMPRESSION: Multifocal radiotracer accumulation in the axial and appendicular skeleton  consistent with metastatic disease. Relatively diffuse uptake is identified in the right femoral neck, compatible with the sclerotic lesion seen on the 12/28/2020 CT exam.      INTERVAL HISTORY:  Sheryl Pratt is here for a follow up of metastatic gastric cancer. She was last seen by me on 03/02/21. She presents to the clinic alone. She reports she is doing better-- she notes the urea cream has helped a lot. She also reports her neuropathy is stable. She adds she has had a very good week so far with very little bone pain. I asked about swelling in her legs; she notes her feet and ankles swell during the day but will do back down at night.   All other systems were reviewed with the patient and are negative.  MEDICAL HISTORY:  Past Medical History:  Diagnosis Date   Allergy    Arthritis    Bloating    Breast mass, right    Cancer (HCC)    metastaic to bones and abd, May 2022   Constipation    Family history of gastric cancer 09/08/2020   Family history of ovarian cancer 09/08/2020   Family history of pancreatic cancer 09/08/2020   Family history of skin cancer 09/08/2020   Migraine    Pelvic mass    PONV (postoperative nausea and vomiting)    Seasonal allergies    Vitamin D deficiency     SURGICAL HISTORY: Past Surgical History:  Procedure Laterality Date   APPENDECTOMY     AUGMENTATION MAMMAPLASTY  Bilateral    BREAST BIOPSY Right 2014   fibroadenoma   BREAST SURGERY  18 years ago   augmentation.   IR IMAGING GUIDED PORT INSERTION  09/06/2020   LAPAROTOMY  08/09/2020   Procedure: EXPLORATORY LAPAROTOMY, MASS EXCISION, PERITONEAL BIOPSIES;  Surgeon: [name], MD;  Location: [facility];  Service: Gynecology;;   OMENTECTOMY N/A 08/09/2020   Procedure: OMENTECTOMY;  Surgeon: [name], MD;  Location: [facility];  Service: Gynecology;  Laterality: N/A;   SALPINGOOPHORECTOMY Right 08/09/2020   Procedure: OPEN RIGHT SALPINGO OOPHORECTOMY;  Surgeon: [name],  MD;  Location: [facility];  Service: Gynecology;  Laterality: Right;   VAGINAL HYSTERECTOMY  2015    I have reviewed the social history and family history with the patient and they are unchanged from previous note.  ALLERGIES:  is allergic to codeine and wellbutrin [bupropion].  MEDICATIONS:  Current Outpatient Medications  Medication Sig Dispense Refill   acetaminophen (TYLENOL) 325 MG tablet Take by mouth every 6 (six) hours as needed for mild pain.     ALPRAZolam (XANAX) 0.25 MG tablet Take 1 tablet (0.25 mg total) by mouth 2 (two) times daily as needed for anxiety. 20 tablet 0   capecitabine (XELODA) 500 MG tablet TAKE 3 TABLETS BY MOUTH  TWICE DAILY AFTER A MEAL  FOR 14 DAYS ON, THEN 7 DAYS OFF. REPEAT EVERY 21 DAYS 84 tablet 1   cholecalciferol (VITAMIN D3) 25 MCG (1000 UNIT) tablet Take 1,000 Units by mouth daily.     diphenhydrAMINE (BENADRYL) 25 mg capsule Take 25 mg by mouth at bedtime as needed for allergies.     gabapentin (NEURONTIN) 100 MG capsule Take 1-2 capsules (100-200 mg total) by mouth 2 (two) times daily. 90 capsule 0   ibuprofen (ADVIL) 800 MG tablet Take 1 tablet (800 mg total) by mouth every 8 (eight) hours as needed for moderate pain. For AFTER surgery only 30 tablet 0   Ibuprofen-diphenhydrAMINE HCl (IBUPROFEN PM) 200-25 MG CAPS Take 1 tablet by mouth at bedtime as needed (sleep).     ipratropium (ATROVENT) 0.03 % nasal spray Place 2 sprays into both nostrils every 12 (twelve) hours. 30 mL 0   lidocaine-prilocaine (EMLA) cream Apply to affected area once 30 g 3   Multiple Vitamin (MULTIVITAMIN) tablet Take 1 tablet by mouth daily.     ondansetron (ZOFRAN) 8 MG tablet Take 1 tablet (8 mg total) by mouth 2 (two) times daily as needed for refractory nausea / vomiting. Start on day 3 after chemotherapy. 30 tablet 1   oxyCODONE (OXY IR/ROXICODONE) 5 MG immediate release tablet Take 1 tablet (5 mg total) by mouth every 4 (four) hours as needed for severe pain. For severe pain  only, do not take and drive 20 tablet 0   prochlorperazine (COMPAZINE) 10 MG tablet Take 1 tablet (10 mg total) by mouth every 6 (six) hours as needed (Nausea or vomiting). 30 tablet 1   Psyllium (METAMUCIL PO) Take 1 Dose by mouth daily.     senna-docusate (SENOKOT-S) 8.6-50 MG tablet Take 2 tablets by mouth at bedtime. Do not take if having diarrhea (Patient taking differently: Take 2 tablets by mouth daily. Do not take if having diarrhea) 60 tablet 0   Tetrahydrozoline HCl (VISINE OP) Place 1 drop into both eyes daily as needed (itching Daralene Milch).     urea (CARMOL) 10 % cream Apply topically 2 (two) times daily. To palms and bottom of feet 71 g 0   vitamin B-12 (  CYANOCOBALAMIN) 1000 MCG tablet Take 1,000 mcg by mouth daily.     vitamin C (ASCORBIC ACID) 500 MG tablet Take 500 mg by mouth daily.     Vitamin D, Ergocalciferol, (DRISDOL) 1.25 MG (50000 UT) CAPS capsule Take one tablet wkly 12 capsule 3   vitamin E 180 MG (400 UNITS) capsule Take 400 Units by mouth daily.     No current facility-administered medications for this visit.    PHYSICAL EXAMINATION: ECOG PERFORMANCE STATUS: 1 - Symptomatic but completely ambulatory  Vitals:   03/24/21 0907  BP: 92/66  Pulse: 80  Resp: 17  Temp: 98.5 F (36.9 C)  SpO2: 98%   Wt Readings from Last 3 Encounters:  03/24/21 131 lb 6.4 oz (59.6 kg)  03/02/21 132 lb 1.6 oz (59.9 kg)  02/09/21 133 lb 14.4 oz (60.7 kg)     GENERAL:alert, no distress and comfortable SKIN: skin color normal, no rashes or significant lesions EYES: normal, Conjunctiva are pink and non-injected, sclera clear  NEURO: alert & oriented x 3 with fluent speech  LABORATORY DATA:  I have reviewed the data as listed CBC Latest Ref Rng & Units 03/24/2021 03/02/2021 02/09/2021  WBC 4.0 - 10.5 K/uL 4.5 4.5 3.1(L)  Hemoglobin 12.0 - 15.0 g/dL 12.0 12.0 12.1  Hematocrit 36.0 - 46.0 % 34.9(L) 35.9(L) 36.1  Platelets 150 - 400 K/uL 200 193 177     CMP Latest Ref Rng &  Units 03/02/2021 02/09/2021 01/26/2021  Glucose 70 - 99 mg/dL 89 106(H) 89  BUN 6 - 20 mg/dL _0 Creatinine 0.44 - 1.00 mg/dL 0.59 0.62 0.61  Sodium 135 - 145 mmol/L 140 142 140  Potassium 3.5 - 5.1 mmol/L 4.1 3.9 4.4  Chloride 98 - 111 mmol/L 107 110 107  CO2 22 - 32 mmol/L _1 Calcium 8.9 - 10.3 mg/dL 8.9 8.7(L) 9.3  Total Protein 6.5 - 8.1 g/dL 7.0 6.8 7.0  Total Bilirubin 0.3 - 1.2 mg/dL 0.4 0.3 0.4  Alkaline Phos 38 - 126 U/L 239(H) 149(H) 133(H)  AST 15 - 41 U/L 28 35 23  ALT 0 - 44 U/L _2 RADIOGRAPHIC STUDIES: I have personally reviewed the radiological images as listed and agreed with the findings in the report. No results found.    No orders of the defined types were placed in this encounter.  All questions were answered. The patient knows to call the clinic with any problems, questions or concerns. No barriers to learning was detected. The total time spent in the appointment was 30 minutes.     [name], MD 03/24/2021   I, Wilburn Mylar, am acting as scribe for [name], MD.   I have reviewed the above documentation for accuracy and completeness, and I agree with the above.

## 2021-03-26 ENCOUNTER — Encounter: Payer: Self-pay | Admitting: Hematology

## 2021-04-05 ENCOUNTER — Encounter: Payer: 59 | Admitting: Physician Assistant

## 2021-04-07 ENCOUNTER — Other Ambulatory Visit: Payer: 59

## 2021-04-07 ENCOUNTER — Other Ambulatory Visit: Payer: Self-pay

## 2021-04-07 ENCOUNTER — Other Ambulatory Visit: Payer: Self-pay | Admitting: Hematology

## 2021-04-07 DIAGNOSIS — Z0000 Encounter for general adult medical examination without abnormal findings: Secondary | ICD-10-CM

## 2021-04-07 DIAGNOSIS — C7961 Secondary malignant neoplasm of right ovary: Secondary | ICD-10-CM

## 2021-04-07 DIAGNOSIS — Z130 Encounter for screening for diseases of the blood and blood-forming organs and certain disorders involving the immune mechanism: Secondary | ICD-10-CM

## 2021-04-07 DIAGNOSIS — C801 Malignant (primary) neoplasm, unspecified: Secondary | ICD-10-CM

## 2021-04-07 DIAGNOSIS — E781 Pure hyperglyceridemia: Secondary | ICD-10-CM

## 2021-04-08 ENCOUNTER — Encounter: Payer: Self-pay | Admitting: Hematology

## 2021-04-08 LAB — CBC WITH DIFFERENTIAL/PLATELET
Basophils Absolute: 0 10*3/uL (ref 0.0–0.2)
Basos: 0 %
EOS (ABSOLUTE): 0.1 10*3/uL (ref 0.0–0.4)
Eos: 1 %
Hematocrit: 36.7 % (ref 34.0–46.6)
Hemoglobin: 12.5 g/dL (ref 11.1–15.9)
Immature Grans (Abs): 0 10*3/uL (ref 0.0–0.1)
Immature Granulocytes: 0 %
Lymphocytes Absolute: 1.1 10*3/uL (ref 0.7–3.1)
Lymphs: 27 %
MCH: 34.3 pg — ABNORMAL HIGH (ref 26.6–33.0)
MCHC: 34.1 g/dL (ref 31.5–35.7)
MCV: 101 fL — ABNORMAL HIGH (ref 79–97)
Monocytes Absolute: 0.4 10*3/uL (ref 0.1–0.9)
Monocytes: 11 %
Neutrophils Absolute: 2.4 10*3/uL (ref 1.4–7.0)
Neutrophils: 61 %
Platelets: 226 10*3/uL (ref 150–450)
RBC: 3.64 x10E6/uL — ABNORMAL LOW (ref 3.77–5.28)
RDW: 18.6 % — ABNORMAL HIGH (ref 11.7–15.4)
WBC: 4 10*3/uL (ref 3.4–10.8)

## 2021-04-08 LAB — COMPREHENSIVE METABOLIC PANEL
ALT: 13 IU/L (ref 0–32)
AST: 19 IU/L (ref 0–40)
Albumin/Globulin Ratio: 1.9 (ref 1.2–2.2)
Albumin: 4.3 g/dL (ref 3.8–4.9)
Alkaline Phosphatase: 171 IU/L — ABNORMAL HIGH (ref 44–121)
BUN/Creatinine Ratio: 19 (ref 9–23)
BUN: 10 mg/dL (ref 6–24)
Bilirubin Total: 0.4 mg/dL (ref 0.0–1.2)
CO2: 22 mmol/L (ref 20–29)
Calcium: 9 mg/dL (ref 8.7–10.2)
Chloride: 105 mmol/L (ref 96–106)
Creatinine, Ser: 0.53 mg/dL — ABNORMAL LOW (ref 0.57–1.00)
Globulin, Total: 2.3 g/dL (ref 1.5–4.5)
Glucose: 84 mg/dL (ref 70–99)
Potassium: 4.1 mmol/L (ref 3.5–5.2)
Sodium: 141 mmol/L (ref 134–144)
Total Protein: 6.6 g/dL (ref 6.0–8.5)
eGFR: 108 mL/min/{1.73_m2} (ref >59)

## 2021-04-08 LAB — LIPID PANEL
Chol/HDL Ratio: 3.2 ratio (ref 0.0–4.4)
Cholesterol, Total: 187 mg/dL (ref 100–199)
HDL: 59 mg/dL (ref >39)
LDL Chol Calc (NIH): 107 mg/dL — ABNORMAL HIGH (ref 0–99)
Triglycerides: 118 mg/dL (ref 0–149)
VLDL Cholesterol Cal: 21 mg/dL (ref 5–40)

## 2021-04-08 LAB — HEMOGLOBIN A1C
Est. average glucose Bld gHb Est-mCnc: 108 mg/dL
Hgb A1c MFr Bld: 5.4 % (ref 4.8–5.6)

## 2021-04-08 LAB — TSH: TSH: 1.3 u[IU]/mL (ref 0.450–4.500)

## 2021-04-10 ENCOUNTER — Inpatient Hospital Stay: Payer: 59

## 2021-04-10 ENCOUNTER — Other Ambulatory Visit: Payer: Self-pay

## 2021-04-10 ENCOUNTER — Encounter: Payer: Self-pay | Admitting: Hematology

## 2021-04-10 ENCOUNTER — Inpatient Hospital Stay: Payer: 59 | Admitting: Hematology

## 2021-04-10 VITALS — BP 95/65 | HR 98 | Temp 98.1°F | Resp 18 | Ht 68.0 in | Wt 130.1 lb

## 2021-04-10 DIAGNOSIS — C801 Malignant (primary) neoplasm, unspecified: Secondary | ICD-10-CM

## 2021-04-10 DIAGNOSIS — C7961 Secondary malignant neoplasm of right ovary: Secondary | ICD-10-CM

## 2021-04-10 DIAGNOSIS — Z95828 Presence of other vascular implants and grafts: Secondary | ICD-10-CM

## 2021-04-10 DIAGNOSIS — C169 Malignant neoplasm of stomach, unspecified: Secondary | ICD-10-CM

## 2021-04-10 LAB — CBC WITH DIFFERENTIAL (CANCER CENTER ONLY)
Abs Immature Granulocytes: 0.01 10*3/uL (ref 0.00–0.07)
Basophils Absolute: 0 10*3/uL (ref 0.0–0.1)
Basophils Relative: 1 %
Eosinophils Absolute: 0.1 10*3/uL (ref 0.0–0.5)
Eosinophils Relative: 3 %
HCT: 35.4 % — ABNORMAL LOW (ref 36.0–46.0)
Hemoglobin: 11.7 g/dL — ABNORMAL LOW (ref 12.0–15.0)
Immature Granulocytes: 0 %
Lymphocytes Relative: 21 %
Lymphs Abs: 0.9 10*3/uL (ref 0.7–4.0)
MCH: 34.4 pg — ABNORMAL HIGH (ref 26.0–34.0)
MCHC: 33.1 g/dL (ref 30.0–36.0)
MCV: 104.1 fL — ABNORMAL HIGH (ref 80.0–100.0)
Monocytes Absolute: 0.6 10*3/uL (ref 0.1–1.0)
Monocytes Relative: 15 %
Neutro Abs: 2.5 10*3/uL (ref 1.7–7.7)
Neutrophils Relative %: 60 %
Platelet Count: 199 10*3/uL (ref 150–400)
RBC: 3.4 MIL/uL — ABNORMAL LOW (ref 3.87–5.11)
RDW: 19.9 % — ABNORMAL HIGH (ref 11.5–15.5)
WBC Count: 4.1 10*3/uL (ref 4.0–10.5)
nRBC: 0 % (ref 0.0–0.2)

## 2021-04-10 LAB — CMP (CANCER CENTER ONLY)
ALT: 17 U/L (ref 0–44)
AST: 24 U/L (ref 15–41)
Albumin: 3.7 g/dL (ref 3.5–5.0)
Alkaline Phosphatase: 140 U/L — ABNORMAL HIGH (ref 38–126)
Anion gap: 8 (ref 5–15)
BUN: 10 mg/dL (ref 6–20)
CO2: 26 mmol/L (ref 22–32)
Calcium: 8.7 mg/dL — ABNORMAL LOW (ref 8.9–10.3)
Chloride: 109 mmol/L (ref 98–111)
Creatinine: 0.62 mg/dL (ref 0.44–1.00)
GFR, Estimated: >60 mL/min (ref >60)
Glucose, Bld: 104 mg/dL — ABNORMAL HIGH (ref 70–99)
Potassium: 4 mmol/L (ref 3.5–5.1)
Sodium: 143 mmol/L (ref 135–145)
Total Bilirubin: 0.3 mg/dL (ref 0.3–1.2)
Total Protein: 7 g/dL (ref 6.5–8.1)

## 2021-04-10 MED ORDER — CAPECITABINE 500 MG PO TABS: ORAL_TABLET | ORAL | 0 refills | Status: DC

## 2021-04-10 MED ORDER — SODIUM CHLORIDE 0.9% FLUSH
10.0000 mL | Freq: Once | INTRAVENOUS | Status: AC
  Administered 2021-04-10: 12:00:00 10 mL

## 2021-04-10 MED ORDER — HEPARIN SOD (PORK) LOCK FLUSH 100 UNIT/ML IV SOLN
500.0000 [IU] | Freq: Once | INTRAVENOUS | Status: AC
  Administered 2021-04-10: 12:00:00 500 [IU]

## 2021-04-10 NOTE — Progress Notes (Signed)
Sheryl Pratt   [phone] [phone]   Clinic Follow up Note   Patient Care Team: Lorrene Reid, PA-C as PCP - General Christene Lye, MD (General Surgery) [facility], [place] [name], MD as Consulting Physician (Hematology and Oncology) [name], RN (Inactive) as Oncology Nurse Navigator [name], MD as Consulting Physician (Gynecologic Oncology)  Date of Service:  04/10/2021  CHIEF COMPLAINT: f/u of metastatic gastric adenocarcinoma  CURRENT THERAPY:  Xeloda 152m q12h on day 1-14 every 21 days on 02/09/21  ASSESSMENT & PLAN:  Sheryl PALAZZOLAis a 55y.o. female with   1. 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 Enlarged left adrenal gland.    -On 08/09/20, she underwent diagnostic laparoscopy with right salpingo-oophorectomy to assure the origin of the mass. The large ovarian mass showed adenocarcinoma with signet ring cell features, IHC studies show liver upper GI primary. Biopsy of other pelvic tissue was negative for metastasis. -She underwent EGD with Dr. JArdis Hughson 08/26/20. Final pathology showed poorly differentiated adenofibroma with signet rings features. This is the same as her ovarian pathology.  --FO results showed MSI stable disease with low mutation rate, no targetable mutation. HER2(-). Her PD-L1 was negative, so she is not a candidate for immunotherapy as first-line. -I started her on first-line chemo with FOLFOX every 2 weeks on 09/08/20. This was held while she was receiving palliative radiation. Dose reduced with C5 due to developing neuropathy. Cycle 8 was held due to increased side effects then given at a further lowered dose.  -restaging CT CAP and bone scan on 12/28/20 showed  probable improvement to skeletal lesions (more sclerotic lesions on CT). -she was switched to maintenance Xeloda on 02/09/21. She is tolerating well with only skin toxicity.  -Given the increasing amount of skin toxicity, despite stopping the cycle several days early, we will decrease to dose again to 10056mAM/150032mM. I have also suggested she hold the Xeloda another week and start after Christmas. I refilled for her today. -continue f/u in 3 weeks, will plan for restaging CT/bone scan prior to her 05/04/21 visit   2. Skin Toxicity -secondary to Xeloda -her pharmacy advised her to obtain urea cream OTC. She obtained this and has noted much improvement with the cream.   3. Neuropathy G1 -she is currently taking B12 -She was prescribed gabapentin on 12/01/20, which has provided relief. -stable and controlled on gabapentin   4. Pulmonary nodules, h/o smoking  -she has a history of smoking, and only smokes an occasional cigarette now. She notes nicotine craving when she's stressed and is using nicotine gum. -pulmonary nodules first seen on CT CAP 12/2020, most of which are sub-solid. Will monitor on future scans   5. Lower back pain secondary to bone mets, muscular/nerve pains -Improved with palliative radiation therapy to the right hip and left pelvis 09/30/20 through 10/26/20. -She began Zometa on 12/29/20. -on gabapentin and oxycodone for right hip and leg pain.    6. Social Support -[place] to [place].  -She reports anxiety related to her treatments. I prescribed Xanax on 11/17/20. -she reports her insurance does not cover much in terms of her treatments. She notes she has met with our financial  counselor and was told her husband makes too much money for them to qualify for any assistance.   7. Family History of Cancer  -The patient's brother had pancreatic cancer, diagnosed at age 81. She notes unknown cancer  in another family member. -Genetic testing done 09/08/20 was negative, with VUS in MLH1 and LZTR1.     PLAN: -I refilled Xeloda today, will decrease dose to 1048m am and 15069mpm for 14 days starting next cycle  -start next cycle Xeloda on 12/26 -restaging CT/bone scan in 3 weeks -we have moved her next lab and f/u to 05/11/21   No problem-specific Assessment & Plan notes found for this encounter.   SUMMARY OF ONCOLOGIC HISTORY: Oncology History Overview Note  Cancer Staging Gastric adenocarcinoma ([facility] CenterStaging form: Stomach, AJCC 8th Edition - Clinical stage from 08/26/2020: Stage IVB (cTX, cN0, pM1) - Signed by FeTruitt MerleMD on 09/06/2020 Stage prefix: Initial diagnosis Total positive nodes: 0 Histologic grade (G): G3 Histologic grading system: 3 grade system    Gastric adenocarcinoma ([facility] [place] 07/30/2020 Imaging   CT Abdomen and Pelvis with Contrast  IMPRESSION: 1. Large hypervascular soft tissue mass occupies the pelvis, compressing the urinary bladder and GI structures. No definite connection of this mass is seen to the vaginal cuff. The ovaries are not identified. 2. Enlarged left adrenal gland. Given the presence of soft tissue mass in the pelvis, this may represent metastatic disease to the adrenal gland. 3. Further evaluation with MRI of the abdomen and pelvis may be considered. Surgical consultation is also advised. 4. Right lower pole nonobstructive nephrolithiasis. 5. Aortic atherosclerosis.     08/09/2020 Procedure   Exploratory laparotomy with right salpingo-oophorectomy, omentectomy washings, peritoneal biopsies, and omentectomy under the care of Dr. TuBerline Lopes  08/09/2020 Pathology Results   FINAL MICROSCOPIC DIAGNOSIS:   A. ADNEXAL MASS, RIGHT, SALPINGO OOPHORECTOMY:  - Adenocarcinoma with signet ring cell features.  - No ovarian surface involvement identified.  - Benign Fallopian tube.  - See comment.   The right adnexal mass is 17.8 cm in greatest  dimension and is diffusely involved by adenocarcinoma with diffuse signet ring cell features. Immunohistochemistry shows positivity with cytokeratin 7 and cytokeratin 20.  The tumor is negative with estrogen receptor, GATA3, GCDFP, CD56, chromogranin, synaptic ficin, calretinin, inhibin, Napsin A, TTF-1, WT1 and PAX 8.  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  Possible primary sites  include upper gastrointestinal tract and pancreas.   08/19/2020 Initial Diagnosis   Adenocarcinoma ([facility]  08/26/2020 Procedure   Upper Endoscopy by Dr JaArdis HughsIMPRESSION - There were no overtly neoplastic findings. - There was mild, non-specific gastritis and bulbar duodenitis. Both sites were biopsied given large pelvic mass pathology.   08/26/2020 Pathology Results   Surgical [P], gastric antrum and gastric body bx - POORLY DIFFERENTIATED ADENOCARCINOMA WITH SIGNET RING FEATURES. - SEE MICROSCOPIC DESCRIPTION.    08/26/2020 Cancer Staging   Staging form: Stomach, AJCC 8th Edition - Clinical stage from 08/26/2020: Stage IVB (cTX, cN0, pM1) - Signed by FeTruitt MerleMD on 09/06/2020 Stage prefix: Initial diagnosis Total positive nodes: 0 Histologic grade (G): G3 Histologic grading system: 3 grade system    08/29/2020 PET scan   IMPRESSION: 1. Dominant finding is multifocal hypermetabolic skeletal metastasis. Lesions are subtly evident on CT with subtle sclerosis. Lesions include the pelvis, spine and ribs. 2. Hypermetabolic metastasis in the RIGHT femoral neck with sclerosis may be at risk for pathologic  fracture. 3. Resection of large pelvic mass. No residual carcinoma evident within the pelvis. 4. Benign LEFT adrenal adenoma. 5. Diffuse mild metabolic activity in the gastric fundus and proximal body is nonspecific. Recommend correlation with recent upper endoscopy.     09/08/2020 -  Chemotherapy   First-line FOLFOX q2weeks starting 09/08/20. Held  09/30/20 to proceed with palliative radiation. Restarted 11/03/20    09/26/2020 Genetic Testing   Negative hereditary cancer genetic testing: no pathogenic variants detected in [facility] CustomNext-Cancer +RNAinsight Panel.  Variants of uncertain significance detected in LZTR1 at  p.K452E (c.1354A>G) and MLH1 at p.P747S (c.2239C>T).  The report date is September 21, 2020.   The CustomNext-Cancer +RNAinsight Panel offered by [facility] and includes sequencing and rearrangement analysis for the following 91 genes: AIP, ALK, APC, ATM, AXIN2, BAP1, BARD1, BLM, BMPR1A, BRCA1, BRCA2, BRIP1, CDC73, CDH1, CDK4, CDKN1B, CDKN2A, CHEK2, CTNNA1, DICER1, FANCC, FH, FLCN, GALNT12, KIF1B, LZTR1, MAX, MEN1, MET, MLH1, MRE11A, MSH2, MSH3, MSH6, MUTYH, NBN, NF1, NF2, NTHL1, PALB2, PHOX2B, PMS2, POT1, PRKAR1A, PTCH1, PTEN, RAD50, RAD51C, RAD51D, RB1, RECQL, RET, SDHA, SDHAF2, SDHB, SDHC, SDHD, SMAD4, SMARCA4, SMARCB1, SMARCE1, STK11, SUFU, TMEM127, TP53, TSC1, TSC2, VHL and XRCC2 (sequencing and deletion/duplication); CASR, CFTR, CPA1, CTRC, EGFR, EGLN1, FAM175A, HOXB13, KIT, MITF, MLH3, PALLD, PDGFRA, POLD1, POLE, PRSS1, RINT1, RPS20, SPINK1 and TERT (sequencing only); EPCAM and GREM1 (deletion/duplication only). RNA data is routinely analyzed for use in variant interpretation for all genes.   10/04/2020 - 10/26/2020 Radiation Therapy   Palliative Radiation to right hip/lumbar spine with Dr Lisbeth Renshaw   Site Technique Total Dose (Gy) Dose per Fx (Gy) Completed Fx Beam Energies  Femur Right: Ext_Rt Complex 37.5/37.5 2.5 15/15 15X  Pelvis: Pelvis_Left SI 3D 37.5/37.5 2.5 15/15 10X, 15X    12/28/2020 Imaging   CT CAP  IMPRESSION: 1. Extensive and diffuse sclerotic metastatic bone disease likely reflecting healing changes (lesions were largely lytic on the prior PET-CT). No pathologic fractures or spinal canal compromise. 2. No findings for abdominal/pelvic metastatic disease. 3. Numerous bilateral subsolid/ground-glass pulmonary  nodules will require attention on future scans. 4. Stable left adrenal gland adenoma.   Aortic Atherosclerosis (ICD10-I70.0) and Emphysema (ICD10-J43.9).   12/28/2020 Imaging   Bone Scan  IMPRESSION: Multifocal radiotracer accumulation in the axial and appendicular skeleton consistent with metastatic disease. Relatively diffuse uptake is identified in the right femoral neck, compatible with the sclerotic lesion seen on the 12/28/2020 CT exam.      INTERVAL HISTORY:  Sheryl Pratt is here for a follow up of metastatic gastric cancer. She was last seen by me on 03/24/21. She presents to the clinic alone. She reports some worsening skin toxicity with this last cycle.   All other systems were reviewed with the patient and are negative.  MEDICAL HISTORY:  Past Medical History:  Diagnosis Date   Allergy    Arthritis    Bloating    Breast mass, right    Cancer (HCC)    metastaic to bones and abd, May 2022   Constipation    Family history of gastric cancer 09/08/2020   Family history of ovarian cancer 09/08/2020   Family history of pancreatic cancer 09/08/2020   Family history of skin cancer 09/08/2020   Migraine    Pelvic mass    PONV (postoperative nausea and vomiting)    Seasonal allergies    Vitamin D deficiency     SURGICAL HISTORY: Past Surgical History:  Procedure Laterality Date   APPENDECTOMY     AUGMENTATION MAMMAPLASTY Bilateral  BREAST BIOPSY Right 2014   fibroadenoma   BREAST SURGERY  18 years ago   augmentation.   IR IMAGING GUIDED PORT INSERTION  09/06/2020   LAPAROTOMY  08/09/2020   Procedure: EXPLORATORY LAPAROTOMY, MASS EXCISION, PERITONEAL BIOPSIES;  Surgeon: [name], MD;  Location: [facility];  Service: Gynecology;;   OMENTECTOMY N/A 08/09/2020   Procedure: OMENTECTOMY;  Surgeon: [name], MD;  Location: [facility];  Service: Gynecology;  Laterality: N/A;   SALPINGOOPHORECTOMY Right 08/09/2020   Procedure: OPEN RIGHT SALPINGO OOPHORECTOMY;   Surgeon: [name], MD;  Location: [facility];  Service: Gynecology;  Laterality: Right;   VAGINAL HYSTERECTOMY  2015    I have reviewed the social history and family history with the patient and they are unchanged from previous note.  ALLERGIES:  is allergic to codeine and wellbutrin [bupropion].  MEDICATIONS:  Current Outpatient Medications  Medication Sig Dispense Refill   acetaminophen (TYLENOL) 325 MG tablet Take by mouth every 6 (six) hours as needed for mild pain.     ALPRAZolam (XANAX) 0.25 MG tablet Take 1 tablet (0.25 mg total) by mouth 2 (two) times daily as needed for anxiety. 20 tablet 0   capecitabine (XELODA) 500 MG tablet TAKE 2 TABLETS BY MOUTH IN MORNING AND 3 TABLETS IN EVENING AFTER A MEAL  FOR 14 DAYS ON, THEN 7 DAYS OFF. REPEAT EVERY 21 DAYS. Pt has 15 tabs at home from last cycle 55 tablet 0   cholecalciferol (VITAMIN D3) 25 MCG (1000 UNIT) tablet Take 1,000 Units by mouth daily.     diphenhydrAMINE (BENADRYL) 25 mg capsule Take 25 mg by mouth at bedtime as needed for allergies.     gabapentin (NEURONTIN) 100 MG capsule Take 2 capsules (200 mg total) by mouth 2 (two) times daily. 120 capsule 1   ibuprofen (ADVIL) 800 MG tablet Take 1 tablet (800 mg total) by mouth every 8 (eight) hours as needed for moderate pain. For AFTER surgery only 30 tablet 0   Ibuprofen-diphenhydrAMINE HCl (IBUPROFEN PM) 200-25 MG CAPS Take 1 tablet by mouth at bedtime as needed (sleep).     ipratropium (ATROVENT) 0.03 % nasal spray Place 2 sprays into both nostrils every 12 (twelve) hours. 30 mL 0   lidocaine-prilocaine (EMLA) cream Apply to affected area once 30 g 3   Multiple Vitamin (MULTIVITAMIN) tablet Take 1 tablet by mouth daily.     ondansetron (ZOFRAN) 8 MG tablet Take 1 tablet (8 mg total) by mouth 2 (two) times daily as needed for refractory nausea / vomiting. Start on day 3 after chemotherapy. 30 tablet 1   oxyCODONE (OXY IR/ROXICODONE) 5 MG immediate release tablet Take 1  tablet (5 mg total) by mouth every 4 (four) hours as needed for severe pain. For severe pain only, do not take and drive 20 tablet 0   prochlorperazine (COMPAZINE) 10 MG tablet TAKE 1 TABLET BY MOUTH EVERY 6 HOURS AS NEEDED FOR NAUSEA OR VOMITING 30 tablet 1   Psyllium (METAMUCIL PO) Take 1 Dose by mouth daily.     senna-docusate (SENOKOT-S) 8.6-50 MG tablet Take 2 tablets by mouth at bedtime. Do not take if having diarrhea (Patient taking differently: Take 2 tablets by mouth daily. Do not take if having diarrhea) 60 tablet 0   Tetrahydrozoline HCl (VISINE OP) Place 1 drop into both eyes daily as needed (itching Daralene Milch).     urea (CARMOL) 10 % cream Apply topically 2 (two) times daily. To palms and bottom of feet 71  g 0   vitamin B-12 (CYANOCOBALAMIN) 1000 MCG tablet Take 1,000 mcg by mouth daily.     vitamin C (ASCORBIC ACID) 500 MG tablet Take 500 mg by mouth daily.     Vitamin D, Ergocalciferol, (DRISDOL) 1.25 MG (50000 UT) CAPS capsule Take one tablet wkly 12 capsule 3   vitamin E 180 MG (400 UNITS) capsule Take 400 Units by mouth daily.     No current facility-administered medications for this visit.    PHYSICAL EXAMINATION: ECOG PERFORMANCE STATUS: 2 - Symptomatic, <50% confined to bed  Vitals:   04/10/21 1202  BP: 95/65  Pulse: 98  Resp: 18  Temp: 98.1 F (36.7 C)  SpO2: 98%   Wt Readings from Last 3 Encounters:  04/10/21 130 lb 1.6 oz (59 kg)  03/24/21 131 lb 6.4 oz (59.6 kg)  03/02/21 132 lb 1.6 oz (59.9 kg)     GENERAL:alert, no distress and comfortable SKIN: skin color normal, no rashes or significant lesions EYES: normal, Conjunctiva are pink and non-injected, sclera clear  NEURO: alert & oriented x 3 with fluent speech  LABORATORY DATA:  I have reviewed the data as listed CBC Latest Ref Rng & Units 04/10/2021 04/07/2021 03/24/2021  WBC 4.0 - 10.5 K/uL 4.1 4.0 4.5  Hemoglobin 12.0 - 15.0 g/dL 11.7(L) 12.5 12.0  Hematocrit 36.0 - 46.0 % 35.4(L) 36.7 34.9(L)   Platelets 150 - 400 K/uL 199 226 200     CMP Latest Ref Rng & Units 04/10/2021 04/07/2021 03/24/2021  Glucose 70 - 99 mg/dL 104(H) 84 98  BUN 6 - 20 mg/dL _0 Creatinine 0.44 - 1.00 mg/dL 0.62 0.53(L) 0.63  Sodium 135 - 145 mmol/L 143 141 141  Potassium 3.5 - 5.1 mmol/L 4.0 4.1 4.3  Chloride 98 - 111 mmol/L 109 105 110  CO2 22 - 32 mmol/L _1 Calcium 8.9 - 10.3 mg/dL 8.7(L) 9.0 8.8(L)  Total Protein 6.5 - 8.1 g/dL 7.0 6.6 6.8  Total Bilirubin 0.3 - 1.2 mg/dL 0.3 0.4 0.4  Alkaline Phos 38 - 126 U/L 140(H) 171(H) 197(H)  AST 15 - 41 U/L _2 ALT 0 - 44 U/L _3 RADIOGRAPHIC STUDIES: I have personally reviewed the radiological images as listed and agreed with the findings in the report. No results found.    No orders of the defined types were placed in this encounter.  All questions were answered. The patient knows to call the clinic with any problems, questions or concerns. No barriers to learning was detected. The total time spent in the appointment was 30 minutes.     [name], MD 04/10/2021   I, Wilburn Mylar, am acting as scribe for [name], MD.   I have reviewed the above documentation for accuracy and completeness, and I agree with the above.

## 2021-04-10 NOTE — Progress Notes (Signed)
Prior authorization message sent via Staff Message to [facility] for financial clearance on dx scans ordered by Dr. Burr Medico on 03/24/2021 with expected date of completion on 05/02/2021.  Awaiting financial clearance so scans can be scheduled.

## 2021-04-11 ENCOUNTER — Encounter: Payer: Self-pay | Admitting: Physician Assistant

## 2021-04-11 ENCOUNTER — Ambulatory Visit (INDEPENDENT_AMBULATORY_CARE_PROVIDER_SITE_OTHER): Payer: 59 | Admitting: Physician Assistant

## 2021-04-11 ENCOUNTER — Other Ambulatory Visit: Payer: Self-pay

## 2021-04-11 VITALS — BP 97/61 | HR 83 | Temp 98.3°F | Ht 68.0 in | Wt 130.0 lb

## 2021-04-11 DIAGNOSIS — E782 Mixed hyperlipidemia: Secondary | ICD-10-CM

## 2021-04-11 DIAGNOSIS — E559 Vitamin D deficiency, unspecified: Secondary | ICD-10-CM | POA: Diagnosis not present

## 2021-04-11 DIAGNOSIS — Z23 Encounter for immunization: Secondary | ICD-10-CM

## 2021-04-11 DIAGNOSIS — C169 Malignant neoplasm of stomach, unspecified: Secondary | ICD-10-CM

## 2021-04-11 NOTE — Progress Notes (Deleted)
Subjective:     Sheryl Pratt is a 55 y.o. female and is here for a comprehensive physical exam. The patient reports {problems:16946}.  Social History   Socioeconomic History   Marital status: Married    Spouse name: Sharmayne Jablon   Number of children: Not on file   Years of education: Not on file   Highest education level: Not on file  Occupational History   Not on file  Tobacco Use   Smoking status: Every Day    Packs/day: 0.25    Years: 10.00    Pack years: 2.50    Types: Cigarettes   Smokeless tobacco: Never   Tobacco comments:    1-2 cigs QD  Vaping Use   Vaping Use: Never used  Substance and Sexual Activity   Alcohol use: Yes    Comment: occasional   Drug use: No   Sexual activity: Yes    Birth control/protection: Surgical  Other Topics Concern   Not on file  Social History Narrative   Not on file   Social Determinants of Health   Financial Resource Strain: Not on file  Food Insecurity: Not on file  Transportation Needs: Not on file  Physical Activity: Not on file  Stress: Not on file  Social Connections: Not on file  Intimate Partner Violence: Not At Risk   Fear of Current or Ex-Partner: No   Emotionally Abused: No   Physically Abused: No   Sexually Abused: No   Health Maintenance  Topic Date Due   Pneumococcal Vaccine 3-19 Years old (1 - PCV) Never done   HIV Screening  Never done   PAP SMEAR-Modifier  Never done   MAMMOGRAM  07/30/2014   COVID-19 Vaccine (3 - Moderna risk series) 06/07/2020   INFLUENZA VACCINE  11/21/2020   TETANUS/TDAP  01/10/2030   Hepatitis C Screening  Completed   Zoster Vaccines- Shingrix  Completed   HPV VACCINES  Aged Out   COLONOSCOPY (Pts 45-46yrs Insurance coverage will need to be confirmed)  Discontinued    {Common ambulatory SmartLinks:19316}  Review of Systems {ros; complete:30496}   Objective:    {Exam, Complete:[id number]}    Assessment:    Healthy female exam. ***      Plan:     See After Visit Summary for Counseling Recommendations

## 2021-04-11 NOTE — Progress Notes (Signed)
Established Patient Office Visit  Subjective:  Patient ID: Sheryl Pratt, female    DOB: 19-Dec-1965  Age: 55 y.o. MRN: [id number]  CC:  Chief Complaint  Patient presents with   Annual Exam    HPI SHANELE NISSAN presents for follow up on hyperlipidemia and Vitamin D deficiency. Patient reports has been eating more protein (fish and chicken), used to be vegan. Patient reports has not needed to use nasal spray. Takes an Human resources officer when needed and at bedtime takes a benadryl to help with sleep. Patient is being treated for gastric cancer and states was changed from infusion to oral medication which she is tolerating better with the exception of skin reaction. Her dose was recently lowered.  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   IR IMAGING GUIDED PORT INSERTION  09/06/2020   LAPAROTOMY  08/09/2020   Procedure: EXPLORATORY LAPAROTOMY, MASS EXCISION, PERITONEAL BIOPSIES;  Surgeon: [name], MD;  Location: [facility];  Service: Gynecology;;   OMENTECTOMY N/A 08/09/2020   Procedure: OMENTECTOMY;  Surgeon: [name], MD;  Location: [facility];  Service: Gynecology;  Laterality: N/A;   SALPINGOOPHORECTOMY Right 08/09/2020   Procedure: OPEN RIGHT SALPINGO OOPHORECTOMY;  Surgeon: [name], MD;  Location: [facility];  Service: Gynecology;  Laterality:  Right;   VAGINAL HYSTERECTOMY  2015    Family History  Problem Relation Age of Onset   Pancreatic cancer Brother        d. 105   Gastric cancer Paternal Uncle        d. 30   Skin cancer Paternal Grandmother        metastatic; dx unknown age   Pancreatic cancer Other        mother's half brother's son; d. 39s   Skin cancer Other        mother's half brother   Ovarian cancer Cousin        paternal cousin; d. 18s   Uterine cancer Neg Hx    Breast cancer Neg Hx    Colon cancer Neg Hx     Social History   Socioeconomic History   Marital status: Married    Spouse name: Sheryl Pratt   Number of children: Not on file   Years of education: Not on file   Highest education level: Not on file  Occupational History   Not on file  Tobacco Use   Smoking status: Every Day    Packs/day: 0.25    Years: 10.00    Pack years: 2.50    Types: Cigarettes   Smokeless tobacco: Never   Tobacco comments:    1-2 cigs QD  Vaping Use   Vaping Use: Never used  Substance and Sexual Activity   Alcohol use: Yes    Comment: occasional   Drug use: No   Sexual activity: Yes    Birth control/protection: Surgical  Other Topics Concern   Not on file  Social History Narrative   Not on file   Social Determinants of Health   Financial Resource Strain: Not on file  Food Insecurity: Not on file  Transportation Needs: Not on file  Physical Activity: Not on file  Stress: Not on file  Social Connections: Not on file  Intimate Partner Violence: Not At Risk   Fear of Current or Ex-Partner: No   Emotionally Abused: No   Physically Abused: No   Sexually Abused: No    Outpatient Medications Prior to Visit  Medication Sig Dispense Refill   acetaminophen (TYLENOL) 325 MG tablet Take by mouth every 6 (six) hours as needed for mild pain.     ALPRAZolam (XANAX) 0.25 MG tablet Take 1 tablet (0.25 mg total) by mouth 2 (two) times daily as needed for anxiety. 20 tablet 0   capecitabine (XELODA) 500 MG  tablet TAKE 2 TABLETS BY MOUTH IN MORNING AND 3 TABLETS IN EVENING AFTER A MEAL  FOR 14 DAYS ON, THEN 7 DAYS OFF. REPEAT EVERY 21 DAYS. Pt has 15 tabs at home from last cycle 55 tablet 0   cholecalciferol (VITAMIN D3) 25 MCG (1000 UNIT) tablet Take 1,000 Units by mouth daily.     diphenhydrAMINE (BENADRYL) 25 mg capsule Take 25 mg by mouth at bedtime as needed for allergies.     gabapentin (NEURONTIN) 100 MG capsule Take 2 capsules (200 mg total) by mouth 2 (two) times daily. 120 capsule 1   ibuprofen (ADVIL) 800 MG tablet Take 1 tablet (800 mg total) by mouth every 8 (eight) hours as needed for moderate pain. For AFTER surgery only 30 tablet 0   Ibuprofen-diphenhydrAMINE HCl (IBUPROFEN PM) 200-25 MG CAPS Take 1 tablet by mouth at bedtime as needed (sleep).     ipratropium (ATROVENT) 0.03 % nasal spray Place 2 sprays into both nostrils every 12 (twelve) hours. 30 mL 0   lidocaine-prilocaine (EMLA) cream Apply to affected area once 30 g 3   Multiple Vitamin (MULTIVITAMIN) tablet Take 1 tablet by mouth daily.     ondansetron (ZOFRAN) 8 MG tablet Take 1 tablet (8 mg total) by mouth 2 (two) times daily as needed for refractory nausea / vomiting. Start on day 3 after chemotherapy. 30 tablet 1   oxyCODONE (OXY IR/ROXICODONE) 5 MG immediate release tablet Take 1 tablet (5 mg total) by mouth every 4 (four) hours as needed for severe pain. For severe pain only, do not take and drive 20 tablet 0   prochlorperazine (COMPAZINE) 10 MG tablet TAKE 1 TABLET BY MOUTH EVERY 6 HOURS AS NEEDED FOR NAUSEA OR VOMITING 30 tablet 1   Psyllium (METAMUCIL PO) Take 1 Dose by mouth daily.     senna-docusate (SENOKOT-S) 8.6-50 MG tablet Take 2 tablets by mouth at bedtime. Do not take if having diarrhea (Patient taking differently: Take 2 tablets by mouth daily. Do not take if having diarrhea) 60 tablet 0   Tetrahydrozoline HCl (VISINE OP) Place 1 drop into both eyes daily as needed (itching Daralene Milch).     urea (CARMOL) 10 %  cream Apply topically 2 (two) times daily. To palms and bottom of feet 71 g 0   vitamin B-12 (CYANOCOBALAMIN) 1000 MCG tablet Take 1,000 mcg by mouth daily.     vitamin C (  ASCORBIC ACID) 500 MG tablet Take 500 mg by mouth daily.     vitamin E 180 MG (400 UNITS) capsule Take 400 Units by mouth daily.     Vitamin D, Ergocalciferol, (DRISDOL) 1.25 MG (50000 UT) CAPS capsule Take one tablet wkly 12 capsule 3   No facility-administered medications prior to visit.    Allergies  Allergen Reactions   Codeine Nausea Only   Wellbutrin [Bupropion]     insomnia    ROS Review of Systems Review of Systems:  A fourteen system review of systems was performed and found to be positive as per HPI.   Objective:    Physical Exam General:  Well Developed, well nourished, appropriate for stated age.  Neuro:  Alert and oriented,  extra-ocular muscles intact  HEENT:  Normocephalic, atraumatic, neck supple Skin:  warm. Redness of both hands noted. Cardiac:  RRR, S1 S2 Respiratory: CTA B/L  Vascular:  Ext warm, no cyanosis apprec.; cap RF less 2 sec. Psych:  No HI/SI, judgement and insight good, Euthymic mood. Full Affect.  BP 97/61    Pulse 83    Temp 98.3 F (36.8 C)    Ht '5\' 8"'  (1.727 m)    Wt 130 lb (59 kg)    LMP 07/02/2012    SpO2 99%    BMI 19.77 kg/m  Wt Readings from Last 3 Encounters:  04/11/21 130 lb (59 kg)  04/10/21 130 lb 1.6 oz (59 kg)  03/24/21 131 lb 6.4 oz (59.6 kg)     Health Maintenance Due  Topic Date Due   HIV Screening  Never done   PAP SMEAR-Modifier  Never done   MAMMOGRAM  07/30/2014   COVID-19 Vaccine (3 - Moderna risk series) 06/07/2020   INFLUENZA VACCINE  11/21/2020    There are no preventive care reminders to display for this patient.  Lab Results  Component Value Date   TSH 1.300 04/07/2021   Lab Results  Component Value Date   WBC 4.1 04/10/2021   HGB 11.7 (L) 04/10/2021   HCT 35.4 (L) 04/10/2021   MCV 104.1 (H) 04/10/2021   PLT 199 04/10/2021    Lab Results  Component Value Date   NA 143 04/10/2021   K 4.0 04/10/2021   CO2 26 04/10/2021   GLUCOSE 104 (H) 04/10/2021   BUN 10 04/10/2021   CREATININE 0.62 04/10/2021   BILITOT 0.3 04/10/2021   ALKPHOS 140 (H) 04/10/2021   AST 24 04/10/2021   ALT 17 04/10/2021   PROT 7.0 04/10/2021   ALBUMIN 3.7 04/10/2021   CALCIUM 8.7 (L) 04/10/2021   ANIONGAP 8 04/10/2021   EGFR 108 04/07/2021   Lab Results  Component Value Date   CHOL 187 04/07/2021   Lab Results  Component Value Date   HDL 59 04/07/2021   Lab Results  Component Value Date   LDLCALC 107 (H) 04/07/2021   Lab Results  Component Value Date   TRIG 118 04/07/2021   Lab Results  Component Value Date   CHOLHDL 3.2 04/07/2021   Lab Results  Component Value Date   HGBA1C 5.4 04/07/2021      Assessment & Plan:   Problem List Items Addressed This Visit       Digestive   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 -On Xeloda 1094m/1500 mg.  Mixed hyperlipidemia: -Discussed recent lipid panel which has improved from prior. -Discussed heart healthy diet and recommend to continue with increased protein intake (lean meats). -Will continue to monitor.  Vitamin D deficiency: -Last Vitamin D 46.1, wnl's. Recommend to continue Vitamin D3 1000 units daily. -Will repeat Vitamin D with CPE.  Discussed recent lab results, CBC and CMP stable, A1c and TSH normal.  No orders of the defined types were placed in this encounter.   Follow-up: Return in about 6 months (around 10/10/2021) for CPE and FBW include Vit D.    MLorrene Reid PA-C

## 2021-04-11 NOTE — Patient Instructions (Signed)

## 2021-04-19 ENCOUNTER — Other Ambulatory Visit: Payer: Self-pay

## 2021-04-19 NOTE — Progress Notes (Signed)
Pt called wanting to know should she reschedule her NM Bone Scan and CT  Scan scheduled on 04/27/2021 d/t her taking her Xeloda during this time.  Advised pt to not cancel the NM Bone Scan or CT Scan.  Told pt that this RN will consult with Dr. Burr Medico to see if Dr. Burr Medico would like for the pt to stop the Xeloda for a few days prior to the scans.  Informed pt that this RN will call her with Dr. Ernestina Penna response.

## 2021-04-26 ENCOUNTER — Other Ambulatory Visit: Payer: Self-pay | Admitting: Hematology

## 2021-04-26 DIAGNOSIS — C7961 Secondary malignant neoplasm of right ovary: Secondary | ICD-10-CM

## 2021-04-27 ENCOUNTER — Telehealth: Payer: 59 | Admitting: Physician Assistant

## 2021-04-27 ENCOUNTER — Ambulatory Visit (HOSPITAL_COMMUNITY): Payer: 59

## 2021-04-27 ENCOUNTER — Encounter (HOSPITAL_COMMUNITY): Payer: 59

## 2021-04-27 ENCOUNTER — Telehealth: Payer: Self-pay | Admitting: Hematology

## 2021-04-27 DIAGNOSIS — U071 COVID-19: Secondary | ICD-10-CM

## 2021-04-27 NOTE — Telephone Encounter (Signed)
Rescheduled upcoming appointment due to patient testing positive for COVID on 1/4. Patient is aware of changes. Requested patient call to reschedule scans and informed patient that I would reschedule phone visit with Dr. Burr Medico after the new scan date.

## 2021-04-27 NOTE — Progress Notes (Signed)
Based on the information that you have shared in the e-Visit Questionnaire, we recommend that you convert this visit to a video visit in order for the provider to better assess what is going on.  The provider will be able to give you a more accurate diagnosis and treatment plan if we can more freely discuss your symptoms and with the addition of a virtual examination.   Giving you are COVID positive and with your medical history, you would be someone that would be a candidate for an antiviral medication, which we cannot prescribe via e-visit. As such, I recommend a video visit to get you properly taken care of.  If you convert to a video visit, we will bill your insurance (similar to an office visit) and you will not be charged for this e-Visit. You will be able to stay at home and speak with the first available [facility] advanced practice provider. The link to do a video visit is in the drop down Menu tab of your Welcome screen in [place].

## 2021-04-28 ENCOUNTER — Other Ambulatory Visit: Payer: Self-pay

## 2021-04-28 ENCOUNTER — Ambulatory Visit (INDEPENDENT_AMBULATORY_CARE_PROVIDER_SITE_OTHER): Payer: 59 | Admitting: Physician Assistant

## 2021-04-28 ENCOUNTER — Encounter: Payer: Self-pay | Admitting: Physician Assistant

## 2021-04-28 ENCOUNTER — Telehealth: Payer: Self-pay | Admitting: Physician Assistant

## 2021-04-28 VITALS — Temp 99.0°F | Ht 68.0 in | Wt 130.0 lb

## 2021-04-28 DIAGNOSIS — H1033 Unspecified acute conjunctivitis, bilateral: Secondary | ICD-10-CM

## 2021-04-28 DIAGNOSIS — U071 COVID-19: Secondary | ICD-10-CM | POA: Diagnosis not present

## 2021-04-28 MED ORDER — ERYTHROMYCIN 5 MG/GM OP OINT: 1.0000 "application " | TOPICAL_OINTMENT | Freq: Four times a day (QID) | OPHTHALMIC | 0 refills | Status: AC

## 2021-04-28 MED ORDER — PREDNISONE 20 MG PO TABS: 40.0000 mg | ORAL_TABLET | Freq: Every day | ORAL | 0 refills | Status: DC

## 2021-04-28 MED ORDER — MOLNUPIRAVIR EUA 200MG CAPSULE
4.0000 | ORAL_CAPSULE | Freq: Two times a day (BID) | ORAL | 0 refills | Status: AC
Start: 2021-04-28 — End: 2021-05-03

## 2021-04-28 NOTE — Telephone Encounter (Signed)
I scheduled her a telehealth visit for this morning.

## 2021-04-28 NOTE — Patient Instructions (Signed)
COVID-19: Quarantine and Isolation °Quarantine °If you were exposed °Quarantine and stay away from others when you have been in close contact with someone who has COVID-19. °Isolate °If you are sick or test positive °Isolate when you are sick or when you have COVID-19, even if you don't have symptoms. °When to stay home °Calculating quarantine °The date of your exposure is considered day 0. Day 1 is the first full day after your last contact with a person who has had COVID-19. Stay home and away from other people for at least 5 days. Learn why CDC updated guidance for the general public. °IF YOU were exposed to COVID-19 and are NOT  °up to dateIF YOU were exposed to COVID-19 and are NOT on COVID-19 vaccinations °Quarantine for at least 5 days °Stay home °Stay home and quarantine for at least 5 full days. °Wear a well-fitting mask if you must be around others in your home. °Do not travel. °Get tested °Even if you don't develop symptoms, get tested at least 5 days after you last had close contact with someone with COVID-19. °After quarantine °Watch for symptoms °Watch for symptoms until 10 days after you last had close contact with someone with COVID-19. °Avoid travel °It is best to avoid travel until a full 10 days after you last had close contact with someone with COVID-19. °If you develop symptoms °Isolate immediately and get tested. Continue to stay home until you know the results. Wear a well-fitting mask around others. °Take precautions until day 10 °Wear a well-fitting mask °Wear a well-fitting mask for 10 full days any time you are around others inside your home or in public. Do not go to places where you are unable to wear a well-fitting mask. °If you must travel during days 6-10, take precautions. °Avoid being around people who are more likely to get very sick from COVID-19. °IF YOU were exposed to COVID-19 and are  °up to dateIF YOU were exposed to COVID-19 and are on COVID-19 vaccinations °No  quarantine °You do not need to stay home unless you develop symptoms. °Get tested °Even if you don't develop symptoms, get tested at least 5 days after you last had close contact with someone with COVID-19. °Watch for symptoms °Watch for symptoms until 10 days after you last had close contact with someone with COVID-19. °If you develop symptoms °Isolate immediately and get tested. Continue to stay home until you know the results. Wear a well-fitting mask around others. °Take precautions until day 10 °Wear a well-fitting mask °Wear a well-fitting mask for 10 full days any time you are around others inside your home or in public. Do not go to places where you are unable to wear a well-fitting mask. °Take precautions if traveling °Avoid being around people who are more likely to get very sick from COVID-19. °IF YOU were exposed to COVID-19 and had confirmed COVID-19 within the past 90 days (you tested positive using a viral test) °No quarantine °You do not need to stay home unless you develop symptoms. °Watch for symptoms °Watch for symptoms until 10 days after you last had close contact with someone with COVID-19. °If you develop symptoms °Isolate immediately and get tested. Continue to stay home until you know the results. Wear a well-fitting mask around others. °Take precautions until day 10 °Wear a well-fitting mask °Wear a well-fitting mask for 10 full days any time you are around others inside your home or in public. Do not go to places where you are   unable to wear a well-fitting mask. °Take precautions if traveling °Avoid being around people who are more likely to get very sick from COVID-19. °Calculating isolation °Day 0 is your first day of symptoms or a positive viral test. Day 1 is the first full day after your symptoms developed or your test specimen was collected. If you have COVID-19 or have symptoms, isolate for at least 5 days. °IF YOU tested positive for COVID-19 or have symptoms, regardless of  vaccination status °Stay home for at least 5 days °Stay home for 5 days and isolate from others in your home. °Wear a well-fitting mask if you must be around others in your home. °Do not travel. °Ending isolation if you had symptoms °End isolation after 5 full days if you are fever-free for 24 hours (without the use of fever-reducing medication) and your symptoms are improving. °Ending isolation if you did NOT have symptoms °End isolation after at least 5 full days after your positive test. °If you got very sick from COVID-19 or have a weakened immune system °You should isolate for at least 10 days. Consult your doctor before ending isolation. °Take precautions until day 10 °Wear a well-fitting mask °Wear a well-fitting mask for 10 full days any time you are around others inside your home or in public. Do not go to places where you are unable to wear a well-fitting mask. °Do not travel °Do not travel until a full 10 days after your symptoms started or the date your positive test was taken if you had no symptoms. °Avoid being around people who are more likely to get very sick from COVID-19. °Definitions °Exposure °Contact with someone infected with SARS-CoV-2, the virus that causes COVID-19, in a way that increases the likelihood of getting infected with the virus. °Close contact °A close contact is someone who was less than 6 feet away from an infected person (laboratory-confirmed or a clinical diagnosis) for a cumulative total of 15 minutes or more over a 24-hour period. For example, three individual 5-minute exposures for a total of 15 minutes. People who are exposed to someone with COVID-19 after they completed at least 5 days of isolation are not considered close contacts. °Quarantine °Quarantine is a strategy used to prevent transmission of COVID-19 by keeping people who have been in close contact with someone with COVID-19 apart from others. °Who does not need to quarantine? °If you had close contact with  someone with COVID-19 and you are in one of the following groups, you do not need to quarantine. °You are up to date with your COVID-19 vaccines. °You had confirmed COVID-19 within the last 90 days (meaning you tested positive using a viral test). °If you are up to date with COVID-19 vaccines, you should wear a well-fitting mask around others for 10 days from the date of your last close contact with someone with COVID-19 (the date of last close contact is considered day 0). Get tested at least 5 days after you last had close contact with someone with COVID-19. If you test positive or develop COVID-19 symptoms, isolate from other people and follow recommendations in the Isolation section below. If you tested positive for COVID-19 with a viral test within the previous 90 days and subsequently recovered and remain without COVID-19 symptoms, you do not need to quarantine or get tested after close contact. You should wear a well-fitting mask around others for 10 days from the date of your last close contact with someone with COVID-19 (the date of last   close contact is considered day 0). If you have COVID-19 symptoms, get tested and isolate from other people and follow recommendations in the Isolation section below. °Who should quarantine? °If you come into close contact with someone with COVID-19, you should quarantine if you are not up to date on COVID-19 vaccines. This includes people who are not vaccinated. °What to do for quarantine °Stay home and away from other people for at least 5 days (day 0 through day 5) after your last contact with a person who has COVID-19. The date of your exposure is considered day 0. Wear a well-fitting mask when around others at home, if possible. °For 10 days after your last close contact with someone with COVID-19, watch for fever (100.4°F or greater), cough, shortness of breath, or other COVID-19 symptoms. °If you develop symptoms, get tested immediately and isolate until you receive  your test results. If you test positive, follow isolation recommendations. °If you do not develop symptoms, get tested at least 5 days after you last had close contact with someone with COVID-19. °If you test negative, you can leave your home, but continue to wear a well-fitting mask when around others at home and in public until 10 days after your last close contact with someone with COVID-19. °If you test positive, you should isolate for at least 5 days from the date of your positive test (if you do not have symptoms). If you do develop COVID-19 symptoms, isolate for at least 5 days from the date your symptoms began (the date the symptoms started is day 0). Follow recommendations in the isolation section below. °If you are unable to get a test 5 days after last close contact with someone with COVID-19, you can leave your home after day 5 if you have been without COVID-19 symptoms throughout the 5-day period. Wear a well-fitting mask for 10 days after your date of last close contact when around others at home and in public. °Avoid people who are have weakened immune systems or are more likely to get very sick from COVID-19, and nursing homes and other high-risk settings, until after at least 10 days. °If possible, stay away from people you live with, especially people who are at higher risk for getting very sick from COVID-19, as well as others outside your home throughout the full 10 days after your last close contact with someone with COVID-19. °If you are unable to quarantine, you should wear a well-fitting mask for 10 days when around others at home and in public. °If you are unable to wear a mask when around others, you should continue to quarantine for 10 days. Avoid people who have weakened immune systems or are more likely to get very sick from COVID-19, and nursing homes and other high-risk settings, until after at least 10 days. °See additional information about travel. °Do not go to places where you are  unable to wear a mask, such as restaurants and some gyms, and avoid eating around others at home and at work until after 10 days after your last close contact with someone with COVID-19. °After quarantine °Watch for symptoms until 10 days after your last close contact with someone with COVID-19. °If you have symptoms, isolate immediately and get tested. °Quarantine in high-risk congregate settings °In certain congregate settings that have high risk of secondary transmission (such as correctional and detention facilities, homeless shelters, or cruise ships), CDC recommends a 10-day quarantine for residents, regardless of vaccination and booster status. During periods of critical staffing   shortages, facilities may consider shortening the quarantine period for staff to ensure continuity of operations. Decisions to shorten quarantine in these settings should be made in consultation with state, local, tribal, or territorial health departments and should take into consideration the context and characteristics of the facility. CDC's setting-specific guidance provides additional recommendations for these settings. °Isolation °Isolation is used to separate people with confirmed or suspected COVID-19 from those without COVID-19. People who are in isolation should stay home until it's safe for them to be around others. At home, anyone sick or infected should separate from others, or wear a well-fitting mask when they need to be around others. People in isolation should stay in a specific "sick room" or area and use a separate bathroom if available. Everyone who has presumed or confirmed COVID-19 should stay home and isolate from other people for at least 5 full days (day 0 is the first day of symptoms or the date of the day of the positive viral test for asymptomatic persons). They should wear a mask when around others at home and in public for an additional 5 days. People who are confirmed to have COVID-19 or are showing  symptoms of COVID-19 need to isolate regardless of their vaccination status. This includes: °People who have a positive viral test for COVID-19, regardless of whether or not they have symptoms. °People with symptoms of COVID-19, including people who are awaiting test results or have not been tested. People with symptoms should isolate even if they do not know if they have been in close contact with someone with COVID-19. °What to do for isolation °Monitor your symptoms. If you have an emergency warning sign (including trouble breathing), seek emergency medical care immediately. °Stay in a separate room from other household members, if possible. °Use a separate bathroom, if possible. °Take steps to improve ventilation at home, if possible. °Avoid contact with other members of the household and pets. °Don't share personal household items, like cups, towels, and utensils. °Wear a well-fitting mask when you need to be around other people. °Learn more about what to do if you are sick and how to notify your contacts. °Ending isolation for people who had COVID-19 and had symptoms °If you had COVID-19 and had symptoms, isolate for at least 5 days. To calculate your 5-day isolation period, day 0 is your first day of symptoms. Day 1 is the first full day after your symptoms developed. You can leave isolation after 5 full days. °You can end isolation after 5 full days if you are fever-free for 24 hours without the use of fever-reducing medication and your other symptoms have improved (Loss of taste and smell may persist for weeks or months after recovery and need not delay the end of isolation). °You should continue to wear a well-fitting mask around others at home and in public for 5 additional days (day 6 through day 10) after the end of your 5-day isolation period. If you are unable to wear a mask when around others, you should continue to isolate for a full 10 days. Avoid people who have weakened immune systems or are more  likely to get very sick from COVID-19, and nursing homes and other high-risk settings, until after at least 10 days. °If you continue to have fever or your other symptoms have not improved after 5 days of isolation, you should wait to end your isolation until you are fever-free for 24 hours without the use of fever-reducing medication and your other symptoms have improved.   Continue to wear a well-fitting mask through day 10. Contact your healthcare provider if you have questions. °See additional information about travel. °Do not go to places where you are unable to wear a mask, such as restaurants and some gyms, and avoid eating around others at home and at work until a full 10 days after your first day of symptoms. °If an individual has access to a test and wants to test, the best approach is to use an antigen test1 towards the end of the 5-day isolation period. Collect the test sample only if you are fever-free for 24 hours without the use of fever-reducing medication and your other symptoms have improved (loss of taste and smell may persist for weeks or months after recovery and need not delay the end of isolation). If your test result is positive, you should continue to isolate until day 10. If your test result is negative, you can end isolation, but continue to wear a well-fitting mask around others at home and in public until day 10. Follow additional recommendations for masking and avoiding travel as described above. °1As noted in the labeling for authorized over-the counter antigen tests: Negative results should be treated as presumptive. Negative results do not rule out SARS-CoV-2 infection and should not be used as the sole basis for treatment or patient management decisions, including infection control decisions. To improve results, antigen tests should be used twice over a three-day period with at least 24 hours and no more than 48 hours between tests. °Note that these recommendations on ending isolation  do not apply to people who are moderately ill or very sick from COVID-19 or have weakened immune systems. See section below for recommendations for when to end isolation for these groups. °Ending isolation for people who tested positive for COVID-19 but had no symptoms °If you test positive for COVID-19 and never develop symptoms, isolate for at least 5 days. Day 0 is the day of your positive viral test (based on the date you were tested) and day 1 is the first full day after the specimen was collected for your positive test. You can leave isolation after 5 full days. °If you continue to have no symptoms, you can end isolation after at least 5 days. °You should continue to wear a well-fitting mask around others at home and in public until day 10 (day 6 through day 10). If you are unable to wear a mask when around others, you should continue to isolate for 10 days. Avoid people who have weakened immune systems or are more likely to get very sick from COVID-19, and nursing homes and other high-risk settings, until after at least 10 days. °If you develop symptoms after testing positive, your 5-day isolation period should start over. Day 0 is your first day of symptoms. Follow the recommendations above for ending isolation for people who had COVID-19 and had symptoms. °See additional information about travel. °Do not go to places where you are unable to wear a mask, such as restaurants and some gyms, and avoid eating around others at home and at work until 10 days after the day of your positive test. °If an individual has access to a test and wants to test, the best approach is to use an antigen test1 towards the end of the 5-day isolation period. If your test result is positive, you should continue to isolate until day 10. If your test result is positive, you can also choose to test daily and if your test result   is negative, you can end isolation, but continue to wear a well-fitting mask around others at home and in  public until day 10. Follow additional recommendations for masking and avoiding travel as described above. °1As noted in the labeling for authorized over-the counter antigen tests: Negative results should be treated as presumptive. Negative results do not rule out SARS-CoV-2 infection and should not be used as the sole basis for treatment or patient management decisions, including infection control decisions. To improve results, antigen tests should be used twice over a three-day period with at least 24 hours and no more than 48 hours between tests. °Ending isolation for people who were moderately or very sick from COVID-19 or have a weakened immune system °People who are moderately ill from COVID-19 (experiencing symptoms that affect the lungs like shortness of breath or difficulty breathing) should isolate for 10 days and follow all other isolation precautions. To calculate your 10-day isolation period, day 0 is your first day of symptoms. Day 1 is the first full day after your symptoms developed. If you are unsure if your symptoms are moderate, talk to a healthcare provider for further guidance. °People who are very sick from COVID-19 (this means people who were hospitalized or required intensive care or ventilation support) and people who have weakened immune systems might need to isolate at home longer. They may also require testing with a viral test to determine when they can be around others. CDC recommends an isolation period of at least 10 and up to 20 days for people who were very sick from COVID-19 and for people with weakened immune systems. Consult with your healthcare provider about when you can resume being around other people. If you are unsure if your symptoms are severe or if you have a weakened immune system, talk to a healthcare provider for further guidance. °People who have a weakened immune system should talk to their healthcare provider about the potential for reduced immune responses to  COVID-19 vaccines and the need to continue to follow current prevention measures (including wearing a well-fitting mask and avoiding crowds and poorly ventilated indoor spaces) to protect themselves against COVID-19 until advised otherwise by their healthcare provider. Close contacts of immunocompromised people--including household members--should also be encouraged to receive all recommended COVID-19 vaccine doses to help protect these people. °Isolation in high-risk congregate settings °In certain high-risk congregate settings that have high risk of secondary transmission and where it is not feasible to cohort people (such as correctional and detention facilities, homeless shelters, and cruise ships), CDC recommends a 10-day isolation period for residents. During periods of critical staffing shortages, facilities may consider shortening the isolation period for staff to ensure continuity of operations. Decisions to shorten isolation in these settings should be made in consultation with state, local, tribal, or territorial health departments and should take into consideration the context and characteristics of the facility. CDC's setting-specific guidance provides additional recommendations for these settings. °This CDC guidance is meant to supplement--not replace--any federal, state, local, territorial, or tribal health and safety laws, rules, and regulations. °Recommendations for specific settings °These recommendations do not apply to healthcare professionals. For guidance specific to these settings, see °Healthcare professionals: Interim Guidance for Managing Healthcare Personnel with SARS-CoV-2 Infection or Exposure to SARS-CoV-2 °Patients, residents, and visitors to healthcare settings: Interim Infection Prevention and Control Recommendations for Healthcare Personnel During the Coronavirus Disease 2019 (COVID-19) Pandemic °Additional setting-specific guidance and recommendations are available. °These  recommendations on quarantine and isolation do apply to K[employer] School   settings. Additional guidance is available here: Overview of COVID-19 Quarantine for K-12 Schools °Travelers: Travel information and recommendations °Congregate facilities and other settings: guidance pages for community, work, and school settings °Ongoing COVID-19 exposure FAQs °I live with someone with COVID-19, but I cannot be separated from them. How do we manage quarantine in this situation? °It is very important for people with COVID-19 to remain apart from other people, if possible, even if they are living together. If separation of the person with COVID-19 from others that they live with is not possible, the other people that they live with will have ongoing exposure, meaning they will be repeatedly exposed until that person is no longer able to spread the virus to other people. In this situation, there are precautions you can take to limit the spread of COVID-19: °The person with COVID-19 and everyone they live with should wear a well-fitting mask inside the home. °If possible, one person should care for the person with COVID-19 to limit the number of people who are in close contact with the infected person. °Take steps to protect yourself and others to reduce transmission in the home: °Quarantine if you are not up to date with your COVID-19 vaccines. °Isolate if you are sick or tested positive for COVID-19, even if you don't have symptoms. °Learn more about the public health recommendations for testing, mask use and quarantine of close contacts, like yourself, who have ongoing exposure. These recommendations differ depending on your vaccination status. °What should I do if I have ongoing exposure to COVID-19 from someone I live with? °Recommendations for this situation depend on your vaccination status: °If you are not up to date on COVID-19 vaccines and have ongoing exposure to COVID-19, you should: °Begin quarantine immediately and  continue to quarantine throughout the isolation period of the person with COVID-19. °Continue to quarantine for an additional 5 days starting the day after the end of isolation for the person with COVID-19. °Get tested at least 5 days after the end of isolation of the infected person that lives with them. °If you test negative, you can leave the home but should continue to wear a well-fitting mask when around others at home and in public until 10 days after the end of isolation for the person with COVID-19. °Isolate immediately if you develop symptoms of COVID-19 or test positive. °If you are up to date with COVID-19 vaccines and have ongoing exposure to COVID-19, you should: °Get tested at least 5 days after your first exposure. A person with COVID-19 is considered infectious starting 2 days before they develop symptoms, or 2 days before the date of their positive test if they do not have symptoms. °Get tested again at least 5 days after the end of isolation for the person with COVID-19. °Wear a well-fitting mask when you are around the person with COVID-19, and do this throughout their isolation period. °Wear a well-fitting mask around others for 10 days after the infected person's isolation period ends. °Isolate immediately if you develop symptoms of COVID-19 or test positive. °What should I do if multiple people I live with test positive for COVID-19 at different times? °Recommendations for this situation depend on your vaccination status: °If you are not up to date with your COVID-19 vaccines, you should: °Quarantine throughout the isolation period of any infected person that you live with. °Continue to quarantine until 5 days after the end of isolation date for the most recently infected person that lives with you. For example, if   the last day of isolation of the person most recently infected with COVID-19 was June 30, the new 5-day quarantine period starts on July 1. °Get tested at least 5 days after the end  of isolation for the most recently infected person that lives with you. °Wear a well-fitting mask when you are around any person with COVID-19 while that person is in isolation. °Wear a well-fitting mask when you are around other people until 10 days after your last close contact. °Isolate immediately if you develop symptoms of COVID-19 or test positive. °If you are up to date with your COVID-19 vaccines, you should: °Get tested at least 5 days after your first exposure. A person with COVID-19 is considered infectious starting 2 days before they developed symptoms, or 2 days before the date of their positive test if they do not have symptoms. °Get tested again at least 5 days after the end of isolation for the most recently infected person that lives with you. °Wear a well-fitting mask when you are around any person with COVID-19 while that person is in isolation. °Wear a well-fitting mask around others for 10 days after the end of isolation for the most recently infected person that lives with you. For example, if the last day of isolation for the person most recently infected with COVID-19 was June 30, the new 10-day period to wear a well-fitting mask indoors in public starts on July 1. °Isolate immediately if you develop symptoms of COVID-19 or test positive. °I had COVID-19 and completed isolation. Do I have to quarantine or get tested if someone I live with gets COVID-19 shortly after I completed isolation? °No. If you recently completed isolation and someone that lives with you tests positive for the virus that causes COVID-19 shortly after the end of your isolation period, you do not have to quarantine or get tested as long as you do not develop new symptoms. Once all of the people that live together have completed isolation or quarantine, refer to the guidance below for new exposures to COVID-19. °If you had COVID-19 in the previous 90 days and then came into close contact with someone with COVID-19, you do  not have to quarantine or get tested if you do not have symptoms. But you should: °Wear a well-fitting mask indoors in public for 10 days after your last close contact. °Monitor for COVID-19 symptoms for 10 days from the date of your last close contact. °Isolate immediately and get tested if symptoms develop. °If more than 90 days have passed since your recovery from infection, follow CDC's recommendations for close contacts. These recommendations will differ depending on your vaccination status. °07/20/2020 °Content source: National Center for Immunization and Respiratory Diseases (NCIRD), Division of Viral Diseases °This information is not intended to replace advice given to you by your health care provider. Make sure you discuss any questions you have with your health care provider. °Document Revised: 11/23/2020 Document Reviewed: 11/23/2020 °Elsevier Patient Education © 2022 Elsevier Inc. ° °

## 2021-04-28 NOTE — Telephone Encounter (Signed)
Patient tested + for covid on 04/26/2021 and wants to know if something can be sent to the pharmacy for her? Please advise. [phone]

## 2021-04-28 NOTE — Progress Notes (Signed)
Telehealth office visit note for Sheryl Reid, PA-C- at Primary Care at [facility]   I connected with current patient today by telephone and verified that I am speaking with the correct person    Location of the patient: Home  Location of the provider: Office - This visit type was conducted due to national recommendations for restrictions regarding the COVID-19 Pandemic (e.g. social distancing) in an effort to limit this patient's exposure and mitigate transmission in our community.    - No physical exam could be performed with this format, beyond that communicated to [place] by the patient/ family members as noted.   - Additionally my office staff/ schedulers were to discuss with the patient that there may be a monetary charge related to this service, depending on their medical insurance.  My understanding is that patient understood and consented to proceed.     _________________________________________________________________________________   History of Present Illness: Patient comes in with complaint of COVID-19 infection.  Patient reports she did not feel well Wednesday (2 days ago).  States her son recently tested positive and she had spent the holidays with them so she tested herself and tested positive that same day.  States yesterday did not have a fever and felt calm.  But this morning felt lightheaded.  Also reports runny nose, sneezing, and nasal congestion.  States is interested in the oral viral medication.  Had a headache which has resolved.  No sore throat, chest pain, shortness of breath, cough, nausea, vomiting or diarrhea.  Also reports since Monday has been having matting of both eyes especially in the mornings states they have been crusty with yellow drainage.  Patient reports she has reached out to her oncologist regarding her cancer treatment which she has held since she has not felt well.     GAD 7 : Generalized Anxiety Score 04/11/2021 09/27/2020  Nervous, Anxious,  on Edge 1 2  Control/stop worrying 1 2  Worry too much - different things 1 1  Trouble relaxing 0 0  Restless 0 0  Easily annoyed or irritable 0 0  Afraid - awful might happen 1 1  Total GAD 7 Score 4 6  Anxiety Difficulty Not difficult at all Somewhat difficult    Depression screen [facility] 2/9 04/11/2021 09/27/2020 06/27/2020 03/10/2019 02/04/2019  Decreased Interest 0 2 0 0 0  Down, Depressed, Hopeless 0 2 0 0 1  PHQ - 2 Score 0 4 0 0 1  Altered sleeping 0 2 1 2 3   Tired, decreased energy 0 2 0 0 0  Change in appetite 1 2 0 0 0  Feeling bad or failure about yourself  0 0 0 0 0  Trouble concentrating 0 2 0 0 0  Moving slowly or fidgety/restless 0 0 0 0 0  Suicidal thoughts 0 0 0 0 0  PHQ-9 Score 1 12 1 2 4   Difficult doing work/chores Not difficult at all Somewhat difficult - Not difficult at all Not difficult at all      Impression and Recommendations:     1. COVID-19 virus infection   2. Acute bacterial conjunctivitis of both eyes    COVID-19 virus infection:  -Patient has 1 SAYTK-16 risk of complication and is within 5-day window of symptom onset so will start oral antiviral with molnupiravir to avoid medication interactions (w/ Paxlovid).  Will do a short course of a corticosteroid therapy.  Advised to follow oncology recommendations when she hears back from them.  Continue home supportive care and monitor for worsening symptoms.  Discussed latest CDC isolation guidelines.  Acute bacterial conjunctivitis of both eyes: -Will start empiric ophthalmic antibiotic to treat for bacterial conjunctivitis. Apply warm compresses.  If symptoms fail to improve or worsen recommend to follow-up in person for further evaluation.      - As part of my medical decision making, I reviewed the following data within the [facility] History obtained from pt /family, CMA notes reviewed and incorporated if applicable, Labs reviewed, Radiograph/ tests reviewed if applicable and OV notes  from prior OV's with me, as well as any other specialists she/he has seen since seeing me last, were all reviewed and used in my medical decision making process today.    - Additionally, when appropriate, discussion had with patient regarding our treatment plan, and their biases/concerns about that plan were used in my medical decision making today.    - The patient agreed with the plan and demonstrated an understanding of the instructions.   No barriers to understanding were identified.     - The patient was advised to call back or seek an in-person evaluation if the symptoms worsen or if the condition fails to improve as anticipated.   Return if symptoms worsen or fail to improve.    No orders of the defined types were placed in this encounter.   Meds ordered this encounter  Medications   molnupiravir EUA (LAGEVRIO) 200 mg CAPS capsule    Sig: Take 4 capsules (800 mg total) by mouth 2 (two) times daily for 5 days.    Dispense:  40 capsule    Refill:  0    Order Specific Question:   Supervising Provider    Answer:   Beatrice Lecher D [[id number]]   erythromycin ophthalmic ointment    Sig: Place 1 application into both eyes 4 (four) times daily for 7 days.    Dispense:  26 g    Refill:  0    Order Specific Question:   Supervising Provider    Answer:   Beatrice Lecher D [[id number]]   predniSONE (DELTASONE) 20 MG tablet    Sig: Take 2 tablets (40 mg total) by mouth daily with breakfast.    Dispense:  6 tablet    Refill:  0    Order Specific Question:   Supervising Provider    Answer:   Beatrice Lecher D [[id number]]    There are no discontinued medications.     Time spent on telephone encounter was 11 minutes.  Note:  This note was prepared with assistance of Dragon voice recognition software. Occasional wrong-word or sound-a-like substitutions may have occurred due to the inherent limitations of voice recognition software.     The [place] was signed into law  in 2016 which includes the topic of electronic health records.  This provides immediate access to information in MyChart.  This includes consultation notes, operative notes, office notes, lab results and pathology reports.  If you have any questions about what you read please let us know at your next visit or call us at the office.  We are right here with you.   __________________________________________________________________________________     Patient Care Team    Relationship Specialty Notifications Start End  Sheryl Pratt, [place] PCP - General   08/23/19   Christene Lye, MD  General Surgery  07/31/12   [place]    02/04/19   [name], MD Consulting Physician  Hematology and Oncology  08/17/20   [name], RN (Inactive) Oncology Nurse Navigator   08/17/20   [name], MD Consulting Physician Gynecologic Oncology  08/19/20      -Vitals obtained; medications/ allergies reconciled;  personal medical, social, Sx etc.histories were updated by CMA, reviewed by me and are reflected in chart   Patient Active Problem List   Diagnosis Date Noted   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   Genetic testing 09/26/2020   Port-A-Cath in place 09/20/2020   Family history of pancreatic cancer 09/08/2020   Family history of gastric cancer 09/08/2020   Family history of ovarian cancer 09/08/2020   Family history of skin cancer 09/08/2020   Gastric adenocarcinoma ([place]) 08/19/2020   Pelvic mass in female 08/02/2020   Hordeolum externum left lower eyelid 05/25/2020   Mixed hyperlipidemia 04/14/2019   Hypertriglyceridemia 04/14/2019   Vitamin D deficiency 04/14/2019   Family history of combined hyperlipidemia 04/14/2019   Hx of appendectomy 02/04/2019   Status post hysterectomy with oophorectomy-2015 02/04/2019   Vegan diet 02/04/2019   Smoker- 10 pack yr or less 02/04/2019   Breast mass, right    Breast microcalcification, mammographic  07/31/2012   Lump or mass in breast 07/31/2012     Current Meds  Medication Sig   acetaminophen (TYLENOL) 325 MG tablet Take by mouth every 6 (six) hours as needed for mild pain.   ALPRAZolam (XANAX) 0.25 MG tablet Take 1 tablet (0.25 mg total) by mouth 2 (two) times daily as needed for anxiety.   capecitabine (XELODA) 500 MG tablet TAKE 2 TABLETS BY MOUTH IN THE  MORNING AND 3 TABLETS BY MOUTH  IN THE EVENING AFTER A MEAL FOR  14 DAYS ON THEN 7 DAYS OFF  REPEAT EVERY 21 DAYS   cholecalciferol (VITAMIN D3) 25 MCG (1000 UNIT) tablet Take 1,000 Units by mouth daily.   diphenhydrAMINE (BENADRYL) 25 mg capsule Take 25 mg by mouth at bedtime as needed for allergies.   erythromycin ophthalmic ointment Place 1 application into both eyes 4 (four) times daily for 7 days.   gabapentin (NEURONTIN) 100 MG capsule Take 2 capsules (200 mg total) by mouth 2 (two) times daily.   ibuprofen (ADVIL) 800 MG tablet Take 1 tablet (800 mg total) by mouth every 8 (eight) hours as needed for moderate pain. For AFTER surgery only   Ibuprofen-diphenhydrAMINE HCl (IBUPROFEN PM) 200-25 MG CAPS Take 1 tablet by mouth at bedtime as needed (sleep).   ipratropium (ATROVENT) 0.03 % nasal spray Place 2 sprays into both nostrils every 12 (twelve) hours.   lidocaine-prilocaine (EMLA) cream Apply to affected area once   molnupiravir EUA (LAGEVRIO) 200 mg CAPS capsule Take 4 capsules (800 mg total) by mouth 2 (two) times daily for 5 days.   Multiple Vitamin (MULTIVITAMIN) tablet Take 1 tablet by mouth daily.   ondansetron (ZOFRAN) 8 MG tablet Take 1 tablet (8 mg total) by mouth 2 (two) times daily as needed for refractory nausea / vomiting. Start on day 3 after chemotherapy.   oxyCODONE (OXY IR/ROXICODONE) 5 MG immediate release tablet Take 1 tablet (5 mg total) by mouth every 4 (four) hours as needed for severe pain. For severe pain only, do not take and drive   predniSONE (DELTASONE) 20 MG tablet Take 2 tablets (40 mg total) by  mouth daily with breakfast.   prochlorperazine (COMPAZINE) 10 MG tablet TAKE 1 TABLET BY MOUTH EVERY 6 HOURS AS NEEDED FOR NAUSEA OR VOMITING   Psyllium (METAMUCIL  PO) Take 1 Dose by mouth daily.   senna-docusate (SENOKOT-S) 8.6-50 MG tablet Take 2 tablets by mouth at bedtime. Do not take if having diarrhea (Patient taking differently: Take 2 tablets by mouth daily. Do not take if having diarrhea)   Tetrahydrozoline HCl (VISINE OP) Place 1 drop into both eyes daily as needed (itching Daralene Milch).   urea (CARMOL) 10 % cream Apply topically 2 (two) times daily. To palms and bottom of feet   vitamin B-12 (CYANOCOBALAMIN) 1000 MCG tablet Take 1,000 mcg by mouth daily.   vitamin C (ASCORBIC ACID) 500 MG tablet Take 500 mg by mouth daily.   vitamin E 180 MG (400 UNITS) capsule Take 400 Units by mouth daily.     Allergies:  Allergies  Allergen Reactions   Codeine Nausea Only   Wellbutrin [Bupropion]     insomnia     ROS:  See above HPI for pertinent positives and negatives   Objective:   Temperature 99 F (37.2 C), height 5\' 8"  (1.727 m), weight 130 lb (59 kg), last menstrual period 07/02/2012.  (if some vitals are omitted, this means that patient was UNABLE to obtain them. ) General: A & O * 3; sounds in no acute distress; in usual state of health.  Respiratory: speaking in full sentences, no conversational dyspnea Psych: insight appears good, mood- appears full

## 2021-05-01 ENCOUNTER — Other Ambulatory Visit: Payer: Self-pay

## 2021-05-01 ENCOUNTER — Inpatient Hospital Stay: Payer: 59 | Admitting: Hematology

## 2021-05-01 ENCOUNTER — Telehealth: Payer: Self-pay | Admitting: Physician Assistant

## 2021-05-01 DIAGNOSIS — C7961 Secondary malignant neoplasm of right ovary: Secondary | ICD-10-CM

## 2021-05-01 MED ORDER — CAPECITABINE 500 MG PO TABS: ORAL_TABLET | ORAL | 0 refills | Status: DC

## 2021-05-01 NOTE — Telephone Encounter (Signed)
Patient called and stated she does feel better than she did last week but also feels it has caused a sinus infection and is requesting something for relief. Please advise [phone]

## 2021-05-01 NOTE — Telephone Encounter (Signed)
Called the patient and spoke to her about her symptoms. Encouraged the patient to continue taking COVID anti-virals and to take Mucinex or the Dayquil/Nyquil to help with sinus congestion and pressure. If she was not feeling better by Wednesday, she was advised to call back. She gave verbal understanding.

## 2021-05-01 NOTE — Progress Notes (Signed)
Pt called wanting to know when should she restart taking her Capecitabine.  Pt stopped taking Capecitabine on 04/26/2021 d/t pt tested positive for COVID.  Pt wants to know when to restart her Capecitabine and should she stop taking Capecitabine for scans.  Spoke with Dr. Burr Medico regarding pt's request and Dr. Burr Medico stated the pt can restart 2 wks from Covid diagnosis so if the pt was diagnosed on 04/26/2021 the pt can restart taking Capecitabine on 05/10/2021 or wait until the start of her next cycle which would be 05/19/2021.  Dr. Burr Medico stated the pt does not need to stop taking her Capecitabine for her scans and stated to reschedule the pt's follow-up appts until after the scans are completed.  Called pt to relay Dr. Ernestina Penna responses to her.  Pt verbalized understanding of instructions and had no further questions or concerns at this time.  Sent refill for Capecitabine as ordered by Dr. Burr Medico.

## 2021-05-04 ENCOUNTER — Other Ambulatory Visit: Payer: 59

## 2021-05-04 ENCOUNTER — Ambulatory Visit: Payer: 59 | Admitting: Hematology

## 2021-05-11 ENCOUNTER — Inpatient Hospital Stay: Payer: 59 | Admitting: Hematology

## 2021-05-11 ENCOUNTER — Inpatient Hospital Stay: Payer: 59

## 2021-05-17 ENCOUNTER — Other Ambulatory Visit: Payer: Self-pay | Admitting: Hematology

## 2021-05-17 DIAGNOSIS — C7961 Secondary malignant neoplasm of right ovary: Secondary | ICD-10-CM

## 2021-05-18 ENCOUNTER — Other Ambulatory Visit: Payer: Self-pay

## 2021-05-18 DIAGNOSIS — C7961 Secondary malignant neoplasm of right ovary: Secondary | ICD-10-CM

## 2021-05-18 DIAGNOSIS — C801 Malignant (primary) neoplasm, unspecified: Secondary | ICD-10-CM

## 2021-05-18 MED ORDER — CAPECITABINE 500 MG PO TABS: ORAL_TABLET | ORAL | 0 refills | Status: AC

## 2021-05-19 ENCOUNTER — Ambulatory Visit: Payer: 59 | Admitting: Hematology

## 2021-05-19 ENCOUNTER — Other Ambulatory Visit: Payer: Self-pay

## 2021-05-19 ENCOUNTER — Telehealth: Payer: Self-pay | Admitting: Hematology

## 2021-05-19 ENCOUNTER — Other Ambulatory Visit: Payer: 59

## 2021-05-19 ENCOUNTER — Telehealth: Payer: Self-pay

## 2021-05-19 DIAGNOSIS — C169 Malignant neoplasm of stomach, unspecified: Secondary | ICD-10-CM

## 2021-05-19 NOTE — Progress Notes (Signed)
Per secure chat message from Dr. Burr Medico, change order for CT CAP to just CT of AP w/contrast for 05/24/2021.

## 2021-05-19 NOTE — Telephone Encounter (Signed)
I called pt and she has been discharged from local ED. She has a copy of her CTA chest which showed no PE, small to moderate pleural effusion. She is scheduled for restaging scan here next week and will see her after scan. Will decide if she needs thoracentesis. I answered her questions.  Sheryl Pratt  05/19/2021

## 2021-05-19 NOTE — Telephone Encounter (Signed)
Pt called to say she's in the ED in [place], [place] with difficulty breathing.  Pt stated the ED is telling her she has fluid in her lungs and asked her to reach out to Dr. Ernestina Penna office to inform Dr Burr Medico of their findings.  Sent staff & secure chat messages to Dr. Burr Medico regarding this pt.

## 2021-05-24 ENCOUNTER — Ambulatory Visit (HOSPITAL_COMMUNITY)
Admission: RE | Admit: 2021-05-24 | Discharge: 2021-05-24 | Disposition: A | Discharge status: Home / Self Care | Payer: 59 | Source: Ambulatory Visit | Attending: Hematology | Admitting: Hematology

## 2021-05-24 ENCOUNTER — Encounter (HOSPITAL_COMMUNITY)
Admission: RE | Admit: 2021-05-24 | Discharge: 2021-05-24 | Disposition: A | Discharge status: Home / Self Care | Payer: 59 | Source: Ambulatory Visit | Attending: Hematology | Admitting: Hematology

## 2021-05-24 ENCOUNTER — Other Ambulatory Visit: Payer: Self-pay

## 2021-05-24 DIAGNOSIS — C169 Malignant neoplasm of stomach, unspecified: Secondary | ICD-10-CM | POA: Diagnosis present

## 2021-05-24 DIAGNOSIS — C786 Secondary malignant neoplasm of retroperitoneum and peritoneum: Secondary | ICD-10-CM | POA: Insufficient documentation

## 2021-05-24 MED ORDER — IOHEXOL 300 MG/ML  SOLN
100.0000 mL | Freq: Once | INTRAMUSCULAR | Status: AC | PRN
  Administered 2021-05-24: 100 mL via INTRAVENOUS

## 2021-05-24 MED ORDER — TECHNETIUM TC 99M MEDRONATE IV KIT
20.0000 | PACK | Freq: Once | INTRAVENOUS | Status: AC | PRN
  Administered 2021-05-24: 22 via INTRAVENOUS

## 2021-05-24 MED ORDER — SODIUM CHLORIDE (PF) 0.9 % IJ SOLN
INTRAMUSCULAR | Status: AC
  Filled 2021-05-24: qty 50

## 2021-05-26 ENCOUNTER — Encounter: Payer: Self-pay | Admitting: Hematology

## 2021-05-26 ENCOUNTER — Ambulatory Visit (HOSPITAL_COMMUNITY)
Admission: RE | Admit: 2021-05-26 | Discharge: 2021-05-26 | Disposition: A | Discharge status: Home / Self Care | Payer: 59 | Source: Ambulatory Visit | Attending: Physician Assistant | Admitting: Physician Assistant

## 2021-05-26 ENCOUNTER — Telehealth: Payer: Self-pay | Admitting: Hematology

## 2021-05-26 ENCOUNTER — Ambulatory Visit (HOSPITAL_COMMUNITY)
Admission: RE | Admit: 2021-05-26 | Discharge: 2021-05-26 | Disposition: A | Discharge status: Home / Self Care | Payer: 59 | Source: Ambulatory Visit | Attending: Hematology | Admitting: Hematology

## 2021-05-26 ENCOUNTER — Other Ambulatory Visit: Payer: Self-pay | Admitting: *Deleted

## 2021-05-26 ENCOUNTER — Inpatient Hospital Stay: Payer: 59 | Attending: Gynecologic Oncology | Admitting: Hematology

## 2021-05-26 ENCOUNTER — Other Ambulatory Visit: Payer: Self-pay

## 2021-05-26 ENCOUNTER — Inpatient Hospital Stay: Payer: 59

## 2021-05-26 VITALS — BP 104/70 | HR 87 | Temp 98.3°F | Resp 16 | Ht 68.0 in | Wt 130.3 lb

## 2021-05-26 DIAGNOSIS — C169 Malignant neoplasm of stomach, unspecified: Secondary | ICD-10-CM | POA: Diagnosis present

## 2021-05-26 DIAGNOSIS — Z5111 Encounter for antineoplastic chemotherapy: Secondary | ICD-10-CM | POA: Insufficient documentation

## 2021-05-26 DIAGNOSIS — Z95828 Presence of other vascular implants and grafts: Secondary | ICD-10-CM

## 2021-05-26 DIAGNOSIS — Z5112 Encounter for antineoplastic immunotherapy: Secondary | ICD-10-CM | POA: Insufficient documentation

## 2021-05-26 DIAGNOSIS — J90 Pleural effusion, not elsewhere classified: Secondary | ICD-10-CM | POA: Diagnosis not present

## 2021-05-26 DIAGNOSIS — G629 Polyneuropathy, unspecified: Secondary | ICD-10-CM | POA: Insufficient documentation

## 2021-05-26 DIAGNOSIS — C7961 Secondary malignant neoplasm of right ovary: Secondary | ICD-10-CM | POA: Insufficient documentation

## 2021-05-26 LAB — BODY FLUID CELL COUNT WITH DIFFERENTIAL
Eos, Fluid: 9 %
Lymphs, Fluid: 29 %
Monocyte-Macrophage-Serous Fluid: 33 % — ABNORMAL LOW (ref 50–90)
Neutrophil Count, Fluid: 29 % — ABNORMAL HIGH (ref 0–25)
Total Nucleated Cell Count, Fluid: 1968 cu mm — ABNORMAL HIGH (ref 0–1000)

## 2021-05-26 LAB — CBC WITH DIFFERENTIAL (CANCER CENTER ONLY)
Abs Immature Granulocytes: 0.03 10*3/uL (ref 0.00–0.07)
Basophils Absolute: 0 10*3/uL (ref 0.0–0.1)
Basophils Relative: 1 %
Eosinophils Absolute: 0.2 10*3/uL (ref 0.0–0.5)
Eosinophils Relative: 2 %
HCT: 36.6 % (ref 36.0–46.0)
Hemoglobin: 12.2 g/dL (ref 12.0–15.0)
Immature Granulocytes: 0 %
Lymphocytes Relative: 10 %
Lymphs Abs: 0.8 10*3/uL (ref 0.7–4.0)
MCH: 34.2 pg — ABNORMAL HIGH (ref 26.0–34.0)
MCHC: 33.3 g/dL (ref 30.0–36.0)
MCV: 102.5 fL — ABNORMAL HIGH (ref 80.0–100.0)
Monocytes Absolute: 0.8 10*3/uL (ref 0.1–1.0)
Monocytes Relative: 11 %
Neutro Abs: 6 10*3/uL (ref 1.7–7.7)
Neutrophils Relative %: 76 %
Platelet Count: 459 10*3/uL — ABNORMAL HIGH (ref 150–400)
RBC: 3.57 MIL/uL — ABNORMAL LOW (ref 3.87–5.11)
RDW: 16.8 % — ABNORMAL HIGH (ref 11.5–15.5)
WBC Count: 7.9 10*3/uL (ref 4.0–10.5)
nRBC: 0 % (ref 0.0–0.2)

## 2021-05-26 LAB — CMP (CANCER CENTER ONLY)
ALT: 10 U/L (ref 0–44)
AST: 14 U/L — ABNORMAL LOW (ref 15–41)
Albumin: 3.6 g/dL (ref 3.5–5.0)
Alkaline Phosphatase: 132 U/L — ABNORMAL HIGH (ref 38–126)
Anion gap: 6 (ref 5–15)
BUN: 17 mg/dL (ref 6–20)
CO2: 29 mmol/L (ref 22–32)
Calcium: 8.9 mg/dL (ref 8.9–10.3)
Chloride: 105 mmol/L (ref 98–111)
Creatinine: 0.64 mg/dL (ref 0.44–1.00)
GFR, Estimated: >60 mL/min (ref >60)
Glucose, Bld: 82 mg/dL (ref 70–99)
Potassium: 4.3 mmol/L (ref 3.5–5.1)
Sodium: 140 mmol/L (ref 135–145)
Total Bilirubin: 0.4 mg/dL (ref 0.3–1.2)
Total Protein: 7.2 g/dL (ref 6.5–8.1)

## 2021-05-26 LAB — PROTEIN, PLEURAL OR PERITONEAL FLUID: Total protein, fluid: 4.5 g/dL

## 2021-05-26 IMAGING — US US THORACENTESIS ASP PLEURAL SPACE W/IMG GUIDE
1 series · 4 of 4 positions shown · non-contrast
Comparison: none

INDICATION: Metastatic gastric adenocarcinoma. Left pleural effusion. Request
for diagnostic and therapeutic thoracentesis.

[Series 1: us thoracentesis asp pleural s mc & wl · 4 of 4 slices shown]
[im 1/4]
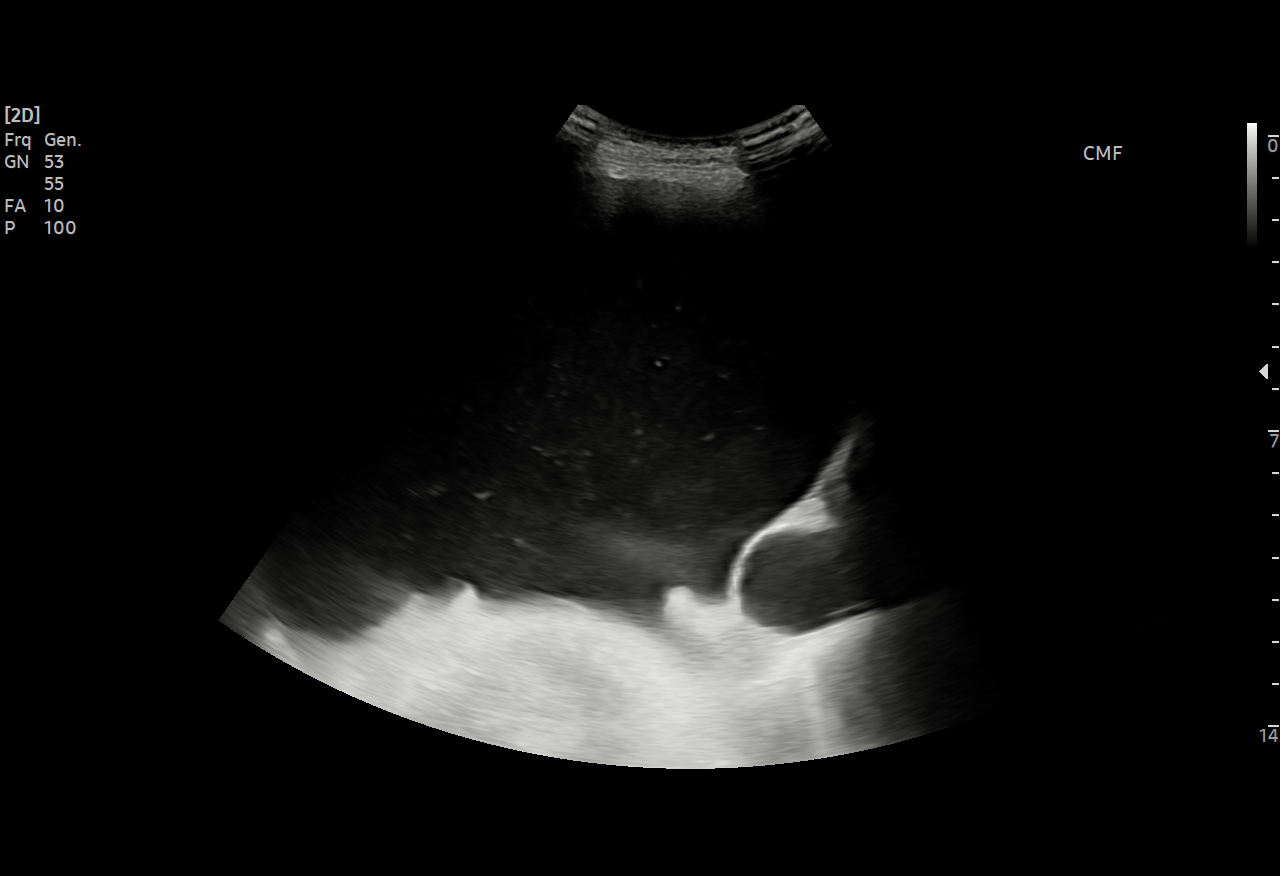
[im 2/4]
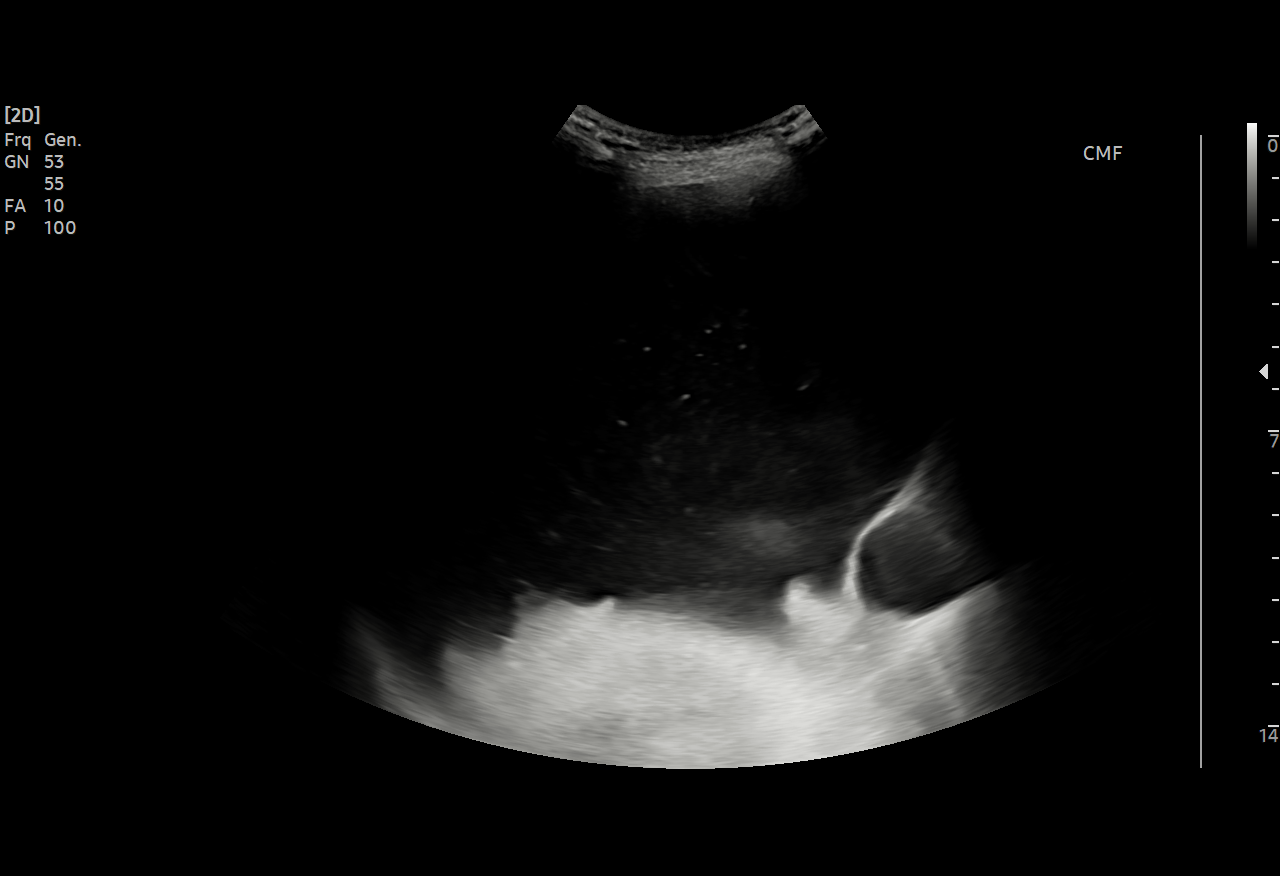
[im 3/4]
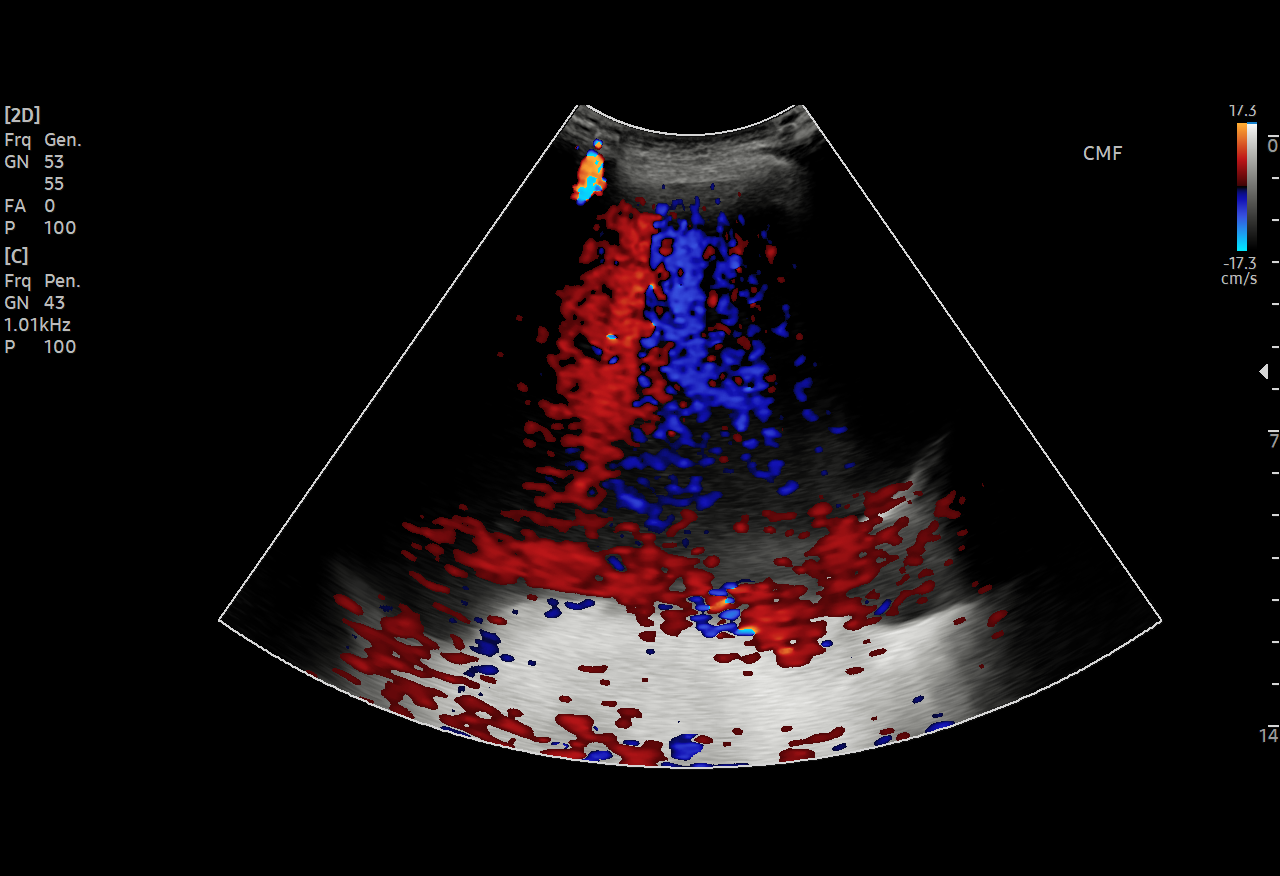
[im 4/4]
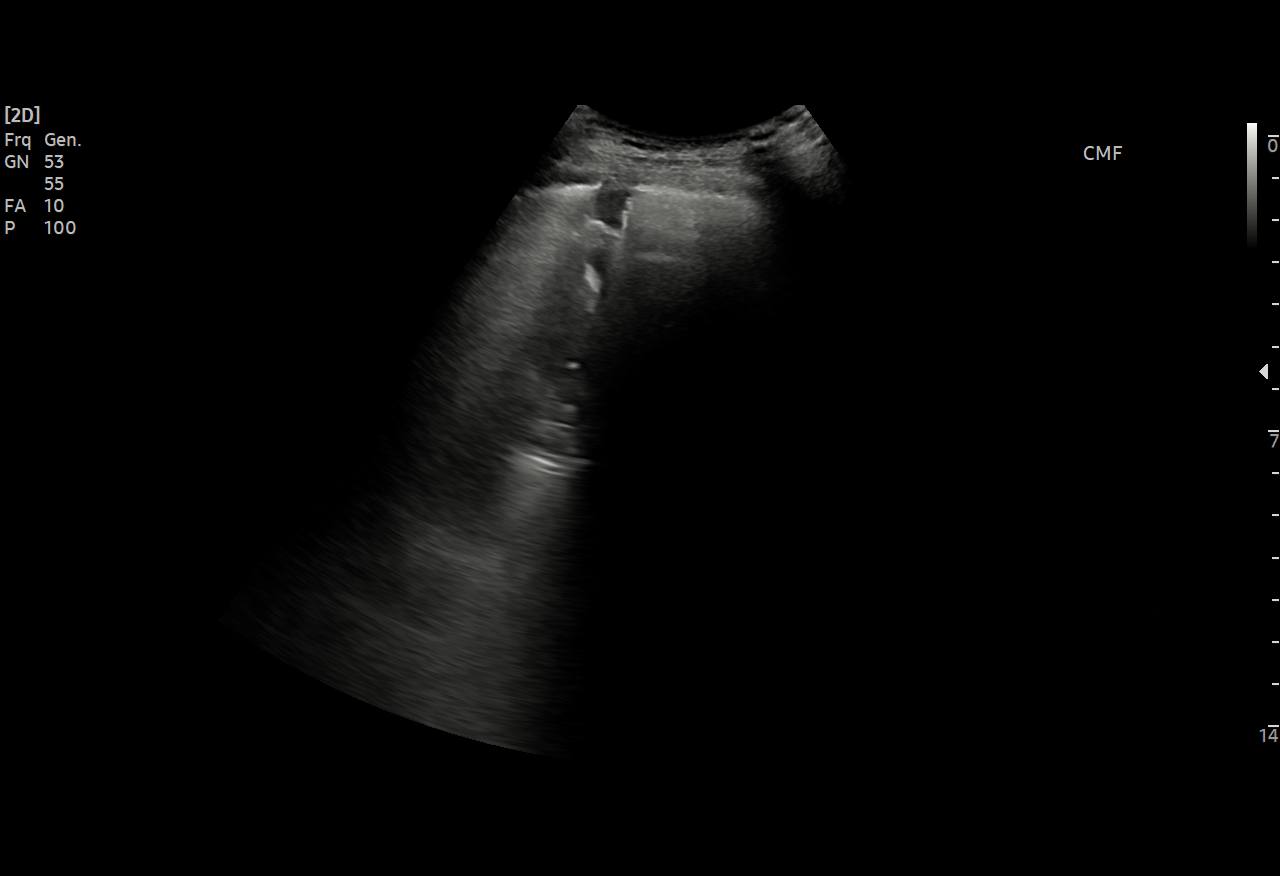

[4 of 4 positions shown; findings below may reference images not displayed]

EXAM:
ULTRASOUND GUIDED LEFT THORACENTESIS

MEDICATIONS:
1% lidocaine 10 mL

COMPLICATIONS:
None immediate.

PROCEDURE:
An ultrasound guided thoracentesis was thoroughly discussed with the
patient and questions answered. The benefits, risks, alternatives
and complications were also discussed. The patient understands and
wishes to proceed with the procedure. Written consent was obtained.

Ultrasound was performed to localize and mark an adequate pocket of
fluid in the left chest. The area was then prepped and draped in the
normal sterile fashion. 1% Lidocaine was used for local anesthesia.
Under ultrasound guidance a 6 Fr Safe-T-Centesis catheter was
introduced. Thoracentesis was performed. The catheter was removed
and a dressing applied.
FINDINGS: A total of approximately 1 L of clear amber fluid was removed.
Samples were sent to the laboratory as requested by the clinical
team.
IMPRESSION: Successful ultrasound guided left thoracentesis yielding 1 L of
pleural fluid.

No pneumothorax on post-procedure chest x-ray.

## 2021-05-26 MED ORDER — HEPARIN SOD (PORK) LOCK FLUSH 100 UNIT/ML IV SOLN
500.0000 [IU] | Freq: Once | INTRAVENOUS | Status: AC
  Administered 2021-05-26: 500 [IU]

## 2021-05-26 MED ORDER — SODIUM CHLORIDE 0.9% FLUSH
10.0000 mL | Freq: Once | INTRAVENOUS | Status: AC
  Administered 2021-05-26: 10 mL

## 2021-05-26 MED ORDER — LIDOCAINE HCL 1 % IJ SOLN
INTRAMUSCULAR | Status: AC
  Administered 2021-05-26: 10 mL
  Filled 2021-05-26: qty 20

## 2021-05-26 NOTE — Telephone Encounter (Signed)
Left message with follow-up appointments per 2/3 los. °

## 2021-05-26 NOTE — Procedures (Signed)
PROCEDURE SUMMARY:  Successful US guided left thoracentesis. Yielded 1 L of amber fluid. Patient tolerated procedure well. No immediate complications. EBL = trace  Specimen was sent for labs.  Post procedure chest X-ray reveals no pneumothorax  Lisvet Rasheed S Lorre Opdahl PA-C 05/26/2021 2:07 PM

## 2021-05-26 NOTE — Progress Notes (Signed)
DISCONTINUE OFF PATHWAY REGIMEN - Other   [id number]:[id number] (Leucovorin IV D1 + Fluorouracil IV D1/CIV D1,2 + Oxaliplatin IV D1) q14 Days:   A cycle is every 14 days:     Oxaliplatin      Leucovorin      Fluorouracil      Fluorouracil   **Always confirm dose/schedule in your pharmacy ordering system**  REASON: Disease Progression PRIOR TREATMENT: [id number] (Leucovorin IV D1 + Fluorouracil IV D1/CIV D1,2 + Oxaliplatin IV D1) q14 Days TREATMENT RESPONSE: Partial Response (PR)  START OFF PATHWAY REGIMEN - Other   [id number]:Ramucirumab 8 mg/kg Days 1, 15 + Paclitaxel 80 mg/m2 Days 1, 8, 15 q28 Days:   A cycle is every 28 days:     Ramucirumab      Paclitaxel   **Always confirm dose/schedule in your pharmacy ordering system**  Patient Characteristics: Intent of Therapy: Non-Curative / Palliative Intent, Discussed with Patient

## 2021-05-26 NOTE — Progress Notes (Signed)
[place]   [phone] Fax:(336) [phone]   Clinic Follow up Note   Patient Care Team: Lorrene Reid, PA-C as PCP - General Christene Lye, MD (General Surgery) [facility] [place] [name], MD as Consulting Physician (Hematology and Oncology) [name], RN (Inactive) as Oncology Nurse Navigator [name], MD as Consulting Physician (Gynecologic Oncology)  Date of Service:  05/26/2021  CHIEF COMPLAINT: f/u of metastatic gastric adenocarcinoma  CURRENT THERAPY:  Pending second line chemo paclitaxel on day 1, 8, 15, Ramucirumab and Nivo on day 1, 15, every 28 days  ASSESSMENT & PLAN:  Sheryl Pratt is a 56 y.o. female with   1. 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 Enlarged left adrenal gland.    -On 08/09/20, she underwent diagnostic laparoscopy with right salpingo-oophorectomy to assure the origin of the mass. The large ovarian mass showed adenocarcinoma with signet ring cell features, IHC studies show liver upper GI primary. Biopsy of other pelvic tissue was negative for metastasis. -She underwent EGD with Dr. Ardis Hughs on 08/26/20. Final pathology showed poorly differentiated adenofibroma with signet rings features. This is the same as her ovarian pathology.  --FO results showed MSI stable disease with low mutation rate, no targetable mutation. HER2(-). Her PD-L1 was negative. -I started her on first-line chemo with FOLFOX every 2 weeks on 09/08/20. This was held while she was receiving palliative radiation. Dose reduced due to neuropathy and side effects. -she was switched to maintenance Xeloda on 02/09/21, dose decreased due to skin toxicity. -restaging CT/bone scan on 05/24/21 (chest CT on 05/19/21) showed  progression in bones and concern for malignant pleural and lymphangitic metastatic disease. I reviewed the results with her today. She has developed a significant dyspnea in the past few weeks -We discussed changing treatment. Unfortunately, she would have to go back to chemo infusions. Given her overall manageable and improved neuropathy, I recommend second line chemotherapy paclitaxel, ramucirumab and nivolumab.  I also discussed the option of irinotecan with and without 5-FU, docetaxel, etc.  --Chemotherapy consent: Side effects including but does not not limited to, fatigue, nausea, vomiting, diarrhea, hair loss, neuropathy, fluid retention, renal and kidney dysfunction, neutropenic fever, needed for blood transfusion, bleeding, were discussed with patient in great detail. She agrees to proceed. -The goal of chemotherapy is palliative, to prolong her life and improve cancer-related symptoms -Plan to start early next week after insurance approval.   2.  Dyspnea -She has developed significant dyspnea on exertion in the past few weeks, related to pleural effusion and lung metastasis.  -We checked her oxygen level when she walks, it dropped to 90% on room air -I requested urgent left thoracentesis today for symptom relief, will also send cytology   3. Neuropathy G1 -she is currently taking B12, and she was prescribed gabapentin on 12/01/20, which has provided relief. -stable and controlled on gabapentin   4. Pulmonary nodules, h/o smoking  -she has a history of smoking, and only smokes an occasional cigarette now and uses nicotine gum. -pulmonary nodules first seen on CT CAP 12/2020, most of which are sub-solid.  -CT chest (PE protocol) on 05/19/21 showed an 11 mm lingular nodule.   5. Lower back and ribcage pain, secondary to bone mets -Improved with palliative radiation therapy to the right hip and left  pelvis 09/30/20 through 10/26/20. -She began Zometa on 12/29/20. -on gabapentin and oxycodone for  right hip and leg pain.    6. Social Support -She and her husband bought a house in [place], where their children live. They plan to retire there.  -She reports anxiety related to her treatments. I prescribed Xanax on 11/17/20. -she reports her insurance does not cover much in terms of her treatments. She notes she has met with our financial counselor and was told her husband makes too much money for them to qualify for any assistance.   7. Family History of Cancer  -The patient's brother had pancreatic cancer, diagnosed at age 59. She notes unknown cancer in another family member. -Genetic testing done 09/08/20 was negative, with VUS in MLH1 and LZTR1.     PLAN: -Scan reviewed, unfortunately she has had a significant cancer progression -We will stop Xeloda -Urgent left thoracentesis today -Change chemotherapy to paclitaxel, ramucirumab.  Plan to add nivolumab on C1D15  -f/u on C1D8 chemo    No problem-specific Assessment & Plan notes found for this encounter.   SUMMARY OF ONCOLOGIC HISTORY: Oncology History Overview Note  Cancer Staging Gastric adenocarcinoma [facility]) Staging form: Stomach, AJCC 8th Edition - Clinical stage from 08/26/2020: Stage IVB (cTX, cN0, pM1) - Signed by [name], MD on 09/06/2020 Stage prefix: Initial diagnosis Total positive nodes: 0 Histologic grade (G): G3 Histologic grading system: 3 grade system    Gastric adenocarcinoma ([place])  07/30/2020 Imaging   CT Abdomen and Pelvis with Contrast  IMPRESSION: 1. Large hypervascular soft tissue mass occupies the pelvis, compressing the urinary bladder and GI structures. No definite connection of this mass is seen to the vaginal cuff. The ovaries are not identified. 2. Enlarged left adrenal gland. Given the presence of soft tissue mass in the pelvis, this may represent metastatic disease to the adrenal gland. 3. Further evaluation with MRI of the abdomen and pelvis may be considered. Surgical consultation is  also advised. 4. Right lower pole nonobstructive nephrolithiasis. 5. Aortic atherosclerosis.     08/09/2020 Procedure   Exploratory laparotomy with right salpingo-oophorectomy, omentectomy washings, peritoneal biopsies, and omentectomy under the care of Dr. Berline Lopes    08/09/2020 Pathology Results   FINAL MICROSCOPIC DIAGNOSIS:   A. ADNEXAL MASS, RIGHT, SALPINGO OOPHORECTOMY:  - Adenocarcinoma with signet ring cell features.  - No ovarian surface involvement identified.  - Benign Fallopian tube.  - See comment.   The right adnexal mass is 17.8 cm in greatest dimension and is diffusely involved by adenocarcinoma with diffuse signet ring cell features. Immunohistochemistry shows positivity with cytokeratin 7 and cytokeratin 20.  The tumor is negative with estrogen receptor, GATA3, GCDFP, CD56, chromogranin, synaptic ficin, calretinin, inhibin, Napsin A, TTF-1, WT1 and PAX 8.  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  Possible primary sites  include upper gastrointestinal tract and pancreas.   08/19/2020 Initial Diagnosis   Adenocarcinoma ([place])   08/26/2020 Procedure   Upper Endoscopy by Dr Ardis Hughs  IMPRESSION - There were no overtly neoplastic findings. - There was mild, non-specific gastritis and bulbar duodenitis. Both sites were biopsied given large pelvic mass pathology.   08/26/2020 Pathology Results   Surgical [P], gastric antrum and gastric body bx - POORLY DIFFERENTIATED ADENOCARCINOMA WITH SIGNET RING FEATURES. - SEE MICROSCOPIC DESCRIPTION.    08/26/2020 Cancer Staging   Staging form: Stomach, AJCC 8th Edition - Clinical stage from 08/26/2020: Stage IVB (cTX, cN0, pM1) - Signed by Burr Medico,  [name], MD on 09/06/2020 Stage prefix: Initial diagnosis Total positive nodes: 0 Histologic grade (G): G3 Histologic grading system: 3 grade system    08/29/2020 PET scan   IMPRESSION: 1. Dominant finding is multifocal hypermetabolic  skeletal metastasis. Lesions are subtly evident on CT with subtle sclerosis. Lesions include the pelvis, spine and ribs. 2. Hypermetabolic metastasis in the RIGHT femoral neck with sclerosis may be at risk for pathologic fracture. 3. Resection of large pelvic mass. No residual carcinoma evident within the pelvis. 4. Benign LEFT adrenal adenoma. 5. Diffuse mild metabolic activity in the gastric fundus and proximal body is nonspecific. Recommend correlation with recent upper endoscopy.     09/08/2020 -  Chemotherapy   First-line FOLFOX q2weeks starting 09/08/20. Held 09/30/20 to proceed with palliative radiation. Restarted 11/03/20    09/26/2020 Genetic Testing   Negative hereditary cancer genetic testing: no pathogenic variants detected in [facility] CustomNext-Cancer +RNAinsight Panel.  Variants of uncertain significance detected in LZTR1 at  p.K452E (c.1354A>G) and MLH1 at p.P747S (c.2239C>T).  The report date is September 21, 2020.   The CustomNext-Cancer +RNAinsight Panel offered by [facility] and includes sequencing and rearrangement analysis for the following 91 genes: AIP, ALK, APC, ATM, AXIN2, BAP1, BARD1, BLM, BMPR1A, BRCA1, BRCA2, BRIP1, CDC73, CDH1, CDK4, CDKN1B, CDKN2A, CHEK2, CTNNA1, DICER1, FANCC, FH, FLCN, GALNT12, KIF1B, LZTR1, MAX, MEN1, MET, MLH1, MRE11A, MSH2, MSH3, MSH6, MUTYH, NBN, NF1, NF2, NTHL1, PALB2, PHOX2B, PMS2, POT1, PRKAR1A, PTCH1, PTEN, RAD50, RAD51C, RAD51D, RB1, RECQL, RET, SDHA, SDHAF2, SDHB, SDHC, SDHD, SMAD4, SMARCA4, SMARCB1, SMARCE1, STK11, SUFU, TMEM127, TP53, TSC1, TSC2, VHL and XRCC2 (sequencing and deletion/duplication); CASR, CFTR, CPA1, CTRC, EGFR, EGLN1, FAM175A, HOXB13, KIT, MITF, MLH3, PALLD, PDGFRA, POLD1, POLE, PRSS1, RINT1, RPS20, SPINK1 and TERT (sequencing only); EPCAM and GREM1 (deletion/duplication only). RNA data is routinely analyzed for use in variant interpretation for all genes.   10/04/2020 - 10/26/2020 Radiation Therapy   Palliative Radiation to  right hip/lumbar spine with Dr Lisbeth Renshaw   Site Technique Total Dose (Gy) Dose per Fx (Gy) Completed Fx Beam Energies  Femur Right: Ext_Rt Complex 37.5/37.5 2.5 15/15 15X  Pelvis: Pelvis_Left SI 3D 37.5/37.5 2.5 15/15 10X, 15X    12/28/2020 Imaging   CT CAP  IMPRESSION: 1. Extensive and diffuse sclerotic metastatic bone disease likely reflecting healing changes (lesions were largely lytic on the prior PET-CT). No pathologic fractures or spinal canal compromise. 2. No findings for abdominal/pelvic metastatic disease. 3. Numerous bilateral subsolid/ground-glass pulmonary nodules will require attention on future scans. 4. Stable left adrenal gland adenoma.   Aortic Atherosclerosis (ICD10-I70.0) and Emphysema (ICD10-J43.9).   12/28/2020 Imaging   Bone Scan  IMPRESSION: Multifocal radiotracer accumulation in the axial and appendicular skeleton consistent with metastatic disease. Relatively diffuse uptake is identified in the right femoral neck, compatible with the sclerotic lesion seen on the 12/28/2020 CT exam.   05/19/2021 Imaging   CT SCAN OF THE CHEST WITH AND WITHOUT IV CONTRAST, CTA OF THE PULMONARY ARTERIES:   IMPRESSION:   3-D RECONSTRUCTIONS DEMONSTRATE NO EVIDENCE OF PULMONARY EMBOLUS.   SMALL TO MODERATE LEFT PLEURAL EFFUSION AND BIBASILAR ATELECTASIS/CONSOLIDATION.   METASTATIC DISEASE TO THE THORACIC SPINE AND RIBS.   11 MM LINGULAR NODULE ON IMAGE 88.    05/24/2021 Imaging   EXAM: CT ABDOMEN AND PELVIS WITH CONTRAST  IMPRESSION: 1. Interval increase in size and number of extensive sclerotic osseous metastatic lesions throughout the included skeleton, consistent with post treatment sclerosis. 2. There are new moderate left, small right pleural effusions and associated atelectasis  or consolidation. Diffuse interlobular septal thickening and perhaps fine nodularity throughout the included bilateral lung bases. Findings are highly concerning for pleural and lymphangitic  metastatic disease. 3. Nonobstructive right nephrolithiasis.   Aortic Atherosclerosis (ICD10-I70.0).   05/24/2021 Imaging   EXAM: NUCLEAR MEDICINE WHOLE BODY BONE SCAN  IMPRESSION: 1. Progressive bony metastatic disease, with new and enlarging areas of radiotracer uptake throughout the spine, thoracic cage, and pelvis as above.   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 She was last seen by me on 04/10/21. She presents to the clinic alone; she notes she drove herself today. She reports she remains short of breath because of the fluid in her lungs. She notes this has limited her function, and she is not able to exercise. She reports residual neuropathy from her prior oxali infusions, but notes it improved after stopping the medicine. She also reports intermittent left breast pain and rib cage pain.   All other systems were reviewed with the patient and are negative.  MEDICAL HISTORY:  Past Medical History:  Diagnosis Date   Allergy    Arthritis    Bloating    Breast mass, right    Cancer ([place])    metastaic to bones and abd, May 2022   Constipation    Family history of gastric cancer 09/08/2020   Family history of ovarian cancer 09/08/2020   Family history of pancreatic cancer 09/08/2020   Family history of skin cancer 09/08/2020   Migraine    Pelvic mass    PONV (postoperative nausea and vomiting)    Seasonal allergies    Vitamin D deficiency     SURGICAL HISTORY: Past  Surgical History:  Procedure Laterality Date   APPENDECTOMY     AUGMENTATION MAMMAPLASTY Bilateral    BREAST BIOPSY Right 2014   fibroadenoma   BREAST SURGERY  18 years ago   augmentation.   IR IMAGING GUIDED PORT INSERTION  09/06/2020   LAPAROTOMY  08/09/2020   Procedure: EXPLORATORY LAPAROTOMY, MASS EXCISION, PERITONEAL BIOPSIES;  Surgeon: [name], MD;  Location: [facility];  Service: Gynecology;;   OMENTECTOMY N/A 08/09/2020   Procedure: OMENTECTOMY;  Surgeon: [name], MD;  Location: [facility];  Service: Gynecology;  Laterality: N/A;   SALPINGOOPHORECTOMY Right 08/09/2020   Procedure: OPEN RIGHT SALPINGO OOPHORECTOMY;  Surgeon: [name], MD;  Location: [facility];  Service: Gynecology;  Laterality: Right;   VAGINAL HYSTERECTOMY  2015    I have reviewed the social history and family history with the patient and they are unchanged from previous note.  ALLERGIES:  is allergic to codeine and wellbutrin [bupropion].  MEDICATIONS:  Current Outpatient Medications  Medication Sig Dispense Refill   acetaminophen (TYLENOL) 325 MG tablet Take by mouth every 6 (six) hours as needed for mild pain.     ALPRAZolam (XANAX) 0.25 MG tablet Take 1 tablet (0.25 mg total) by mouth 2 (two) times daily as needed  for anxiety. 20 tablet 0   capecitabine (XELODA) 500 MG tablet TAKE 2 TABLETS BY MOUTH IN THE  MORNING AND 3 TABLETS BY MOUTH  IN THE EVENING AFTER MEALS FOR  14 DAYS ON THEN 7 DAYS OFF AND  REPEAT EVERY 21 DAYS 70 tablet 0   cholecalciferol (VITAMIN D3) 25 MCG (1000 UNIT) tablet Take 1,000 Units by mouth daily.     diphenhydrAMINE (BENADRYL) 25 mg capsule Take 25 mg by mouth at bedtime as needed for allergies.     gabapentin (NEURONTIN) 100 MG capsule Take 2 capsules (200 mg total) by mouth 2 (two) times daily. 120 capsule 1   ibuprofen (ADVIL) 800 MG tablet Take 1 tablet (800 mg total) by mouth every 8 (eight) hours as needed for moderate pain. For AFTER surgery only 30  tablet 0   Ibuprofen-diphenhydrAMINE HCl (IBUPROFEN PM) 200-25 MG CAPS Take 1 tablet by mouth at bedtime as needed (sleep).     ipratropium (ATROVENT) 0.03 % nasal spray Place 2 sprays into both nostrils every 12 (twelve) hours. 30 mL 0   Multiple Vitamin (MULTIVITAMIN) tablet Take 1 tablet by mouth daily.     oxyCODONE (OXY IR/ROXICODONE) 5 MG immediate release tablet Take 1 tablet (5 mg total) by mouth every 4 (four) hours as needed for severe pain. For severe pain only, do not take and drive 20 tablet 0   predniSONE (DELTASONE) 20 MG tablet Take 2 tablets (40 mg total) by mouth daily with breakfast. 6 tablet 0   Psyllium (METAMUCIL PO) Take 1 Dose by mouth daily.     senna-docusate (SENOKOT-S) 8.6-50 MG tablet Take 2 tablets by mouth at bedtime. Do not take if having diarrhea (Patient taking differently: Take 2 tablets by mouth daily. Do not take if having diarrhea) 60 tablet 0   Tetrahydrozoline HCl (VISINE OP) Place 1 drop into both eyes daily as needed (itching Daralene Milch).     urea (CARMOL) 10 % cream Apply topically 2 (two) times daily. To palms and bottom of feet 71 g 0   vitamin B-12 (CYANOCOBALAMIN) 1000 MCG tablet Take 1,000 mcg by mouth daily.     vitamin C (ASCORBIC ACID) 500 MG tablet Take 500 mg by mouth daily.     vitamin E 180 MG (400 UNITS) capsule Take 400 Units by mouth daily.     No current facility-administered medications for this visit.    PHYSICAL EXAMINATION: ECOG PERFORMANCE STATUS: 3 - Symptomatic, >50% confined to bed  Vitals:   05/26/21 0839  BP: 104/70  Pulse: 87  Resp: 16  Temp: 98.3 F (36.8 C)  SpO2: 94%   Wt Readings from Last 3 Encounters:  05/26/21 130 lb 4.8 oz (59.1 kg)  04/28/21 130 lb (59 kg)  04/11/21 130 lb (59 kg)     GENERAL:alert, no distress and comfortable SKIN: skin color, texture, turgor are normal, no rashes or significant lesions EYES: normal, Conjunctiva are pink and non-injected, sclera clear NECK: supple, thyroid normal size,  non-tender, without nodularity LYMPH:  no palpable lymphadenopathy in the cervical, axillary  LUNGS: Mild tachypnea, decreased breath sound on lung bases, especially on left side, no wheezing or rales HEART: regular rate & rhythm and no murmurs and no lower extremity edema ABDOMEN:abdomen soft, non-tender and normal bowel sounds Musculoskeletal:no cyanosis of digits and no clubbing  NEURO: alert & oriented x 3 with fluent speech, no focal motor/sensory deficits  LABORATORY DATA:  I have reviewed the data as listed CBC Latest Ref Rng & Units  05/26/2021 04/10/2021 04/07/2021  WBC 4.0 - 10.5 K/uL 7.9 4.1 4.0  Hemoglobin 12.0 - 15.0 g/dL 12.2 11.7(L) 12.5  Hematocrit 36.0 - 46.0 % 36.6 35.4(L) 36.7  Platelets 150 - 400 K/uL 459(H) 199 226     CMP Latest Ref Rng & Units 05/26/2021 04/10/2021 04/07/2021  Glucose 70 - 99 mg/dL 82 104(H) 84  BUN 6 - 20 mg/dL _0 Creatinine 0.44 - 1.00 mg/dL 0.64 0.62 0.53(L)  Sodium 135 - 145 mmol/L 140 143 141  Potassium 3.5 - 5.1 mmol/L 4.3 4.0 4.1  Chloride 98 - 111 mmol/L 105 109 105  CO2 22 - 32 mmol/L _1 Calcium 8.9 - 10.3 mg/dL 8.9 8.7(L) 9.0  Total Protein 6.5 - 8.1 g/dL 7.2 7.0 6.6  Total Bilirubin 0.3 - 1.2 mg/dL 0.4 0.3 0.4  Alkaline Phos 38 - 126 U/L 132(H) 140(H) 171(H)  AST 15 - 41 U/L 14(L) 24 19  ALT 0 - 44 U/L _2 RADIOGRAPHIC STUDIES: I have personally reviewed the radiological images as listed and agreed with the findings in the report. DG Chest 1 View  Result Date: 05/26/2021 CLINICAL DATA:  Left pleural effusion EXAM: CHEST  1 VIEW COMPARISON:  Previous studies including CT abdomen done on 05/24/2021 FINDINGS: Cardiac size is within normal limits. Tip of right IJ chest port is seen in the superior vena cava close to the right atrium. There is prominence of interstitial markings in the parahilar regions and lower lung fields. There are patchy infiltrates in the left lower lung fields. There is minimal blunting of  left lateral CP angle. There is interval decrease in left pleural effusion in comparison with the CT abdomen done on 05/24/2021. There is no pneumothorax. IMPRESSION: Increased interstitial markings are seen in the parahilar regions and lower lung fields suggesting scarring or interstitial pneumonitis or lymphangitic spread of metastatic disease. There are patchy infiltrates in the left lower lung fields suggesting atelectasis/pneumonia. There is decrease in left pleural effusion after thoracentesis. There is no pneumothorax. Electronically Signed   By: Elmer Picker M.D.   On: 05/26/2021 13:42   US Thoracentesis Asp Pleural space w/IMG guide  Result Date: 05/26/2021 INDICATION: Metastatic gastric adenocarcinoma. Left pleural effusion. Request for diagnostic and therapeutic thoracentesis. EXAM: ULTRASOUND GUIDED LEFT THORACENTESIS MEDICATIONS: 1% lidocaine 10 mL COMPLICATIONS: None immediate. PROCEDURE: An ultrasound guided thoracentesis was thoroughly discussed with the patient and questions answered. The benefits, risks, alternatives and complications were also discussed. The patient understands and wishes to proceed with the procedure. Written consent was obtained. Ultrasound was performed to localize and mark an adequate pocket of fluid in the left chest. The area was then prepped and draped in the normal sterile fashion. 1% Lidocaine was used for local anesthesia. Under ultrasound guidance a 6 Fr Safe-T-Centesis catheter was introduced. Thoracentesis was performed. The catheter was removed and a dressing applied. FINDINGS: A total of approximately 1 L of clear amber fluid was removed. Samples were sent to the laboratory as requested by the clinical team. IMPRESSION: Successful ultrasound guided left thoracentesis yielding 1 L of pleural fluid. No pneumothorax on post-procedure chest x-ray. Read by: Gareth Eagle, PA-C Electronically Signed   By: Sandi Mariscal M.D.   On: 05/26/2021 14:24      Orders  Placed This Encounter  Procedures   US Thoracentesis Asp Pleural space w/IMG guide    Standing Status:   Future    Number of Occurrences:  1    Standing Expiration Date:   05/26/2022    Order Specific Question:   Are labs required for specimen collection?    Answer:   Yes    Order Specific Question:   Lab orders requested (DO NOT place separate lab orders, these will be automatically ordered during procedure specimen collection):    Answer:   Body Fluid Cell Count With Differential    Order Specific Question:   Lab orders requested (DO NOT place separate lab orders, these will be automatically ordered during procedure specimen collection):    Answer:   Cytology - Non Pap    Order Specific Question:   Lab orders requested (DO NOT place separate lab orders, these will be automatically ordered during procedure specimen collection):    Answer:   Body Fluid Culture    Order Specific Question:   Lab orders requested (DO NOT place separate lab orders, these will be automatically ordered during procedure specimen collection):    Answer:   Gram Stain    Order Specific Question:   Lab orders requested (DO NOT place separate lab orders, these will be automatically ordered during procedure specimen collection):    Answer:   Protein, Body Fluid    Order Specific Question:   Reason for Exam (SYMPTOM  OR DIAGNOSIS REQUIRED)    Answer:   symptom relieve    Order Specific Question:   Preferred imaging location?    Answer:   [facility]   CBC with Differential/Platelet    Standing Status:   Standing    Number of Occurrences:   50    Standing Expiration Date:   05/26/2022   CEA (IN HOUSE-[facility])    Standing Status:   Standing    Number of Occurrences:   20    Standing Expiration Date:   05/26/2022   Comprehensive metabolic panel    Standing Status:   Standing    Number of Occurrences:   50    Standing Expiration Date:   05/26/2022   All questions were answered. The patient knows to call the clinic  with any problems, questions or concerns. No barriers to learning was detected. The total time spent in the appointment was 40 minutes.     [name], MD 05/26/2021   I, Wilburn Mylar, am acting as scribe for [name], MD.   I have reviewed the above documentation for accuracy and completeness, and I agree with the above.

## 2021-05-29 ENCOUNTER — Other Ambulatory Visit: Payer: Self-pay

## 2021-05-30 ENCOUNTER — Inpatient Hospital Stay: Payer: 59

## 2021-05-30 ENCOUNTER — Other Ambulatory Visit: Payer: Self-pay | Admitting: *Deleted

## 2021-05-30 ENCOUNTER — Other Ambulatory Visit: Payer: Self-pay

## 2021-05-30 VITALS — BP 108/66 | HR 82 | Temp 98.2°F | Resp 20 | Ht 68.0 in | Wt 126.5 lb

## 2021-05-30 DIAGNOSIS — C786 Secondary malignant neoplasm of retroperitoneum and peritoneum: Secondary | ICD-10-CM

## 2021-05-30 DIAGNOSIS — C169 Malignant neoplasm of stomach, unspecified: Secondary | ICD-10-CM

## 2021-05-30 DIAGNOSIS — C7961 Secondary malignant neoplasm of right ovary: Secondary | ICD-10-CM | POA: Diagnosis not present

## 2021-05-30 DIAGNOSIS — Z95828 Presence of other vascular implants and grafts: Secondary | ICD-10-CM

## 2021-05-30 DIAGNOSIS — Z5111 Encounter for antineoplastic chemotherapy: Secondary | ICD-10-CM | POA: Diagnosis present

## 2021-05-30 DIAGNOSIS — G629 Polyneuropathy, unspecified: Secondary | ICD-10-CM | POA: Diagnosis not present

## 2021-05-30 DIAGNOSIS — Z5112 Encounter for antineoplastic immunotherapy: Secondary | ICD-10-CM | POA: Diagnosis present

## 2021-05-30 LAB — BODY FLUID CULTURE W GRAM STAIN: Culture: NO GROWTH

## 2021-05-30 LAB — TOTAL PROTEIN, URINE DIPSTICK: Protein, ur: <30 mg/dL — AB

## 2021-05-30 LAB — CYTOLOGY - NON PAP

## 2021-05-30 MED ORDER — SODIUM CHLORIDE 0.9 % IV SOLN
10.0000 mg | Freq: Once | INTRAVENOUS | Status: AC
  Administered 2021-05-30: 10 mg via INTRAVENOUS
  Filled 2021-05-30: qty 10

## 2021-05-30 MED ORDER — SODIUM CHLORIDE 0.9 % IV SOLN: Freq: Once | INTRAVENOUS | Status: AC

## 2021-05-30 MED ORDER — SODIUM CHLORIDE 0.9 % IV SOLN
8.0000 mg/kg | Freq: Once | INTRAVENOUS | Status: AC
  Administered 2021-05-30: 500 mg via INTRAVENOUS
  Filled 2021-05-30: qty 50

## 2021-05-30 MED ORDER — ZOLEDRONIC ACID 4 MG/100ML IV SOLN
4.0000 mg | Freq: Once | INTRAVENOUS | Status: AC
  Administered 2021-05-30: 4 mg via INTRAVENOUS
  Filled 2021-05-30: qty 100

## 2021-05-30 MED ORDER — FAMOTIDINE IN NACL 20-0.9 MG/50ML-% IV SOLN
20.0000 mg | Freq: Once | INTRAVENOUS | Status: AC
  Administered 2021-05-30: 20 mg via INTRAVENOUS
  Filled 2021-05-30: qty 50

## 2021-05-30 MED ORDER — HEPARIN SOD (PORK) LOCK FLUSH 100 UNIT/ML IV SOLN
500.0000 [IU] | Freq: Once | INTRAVENOUS | Status: AC | PRN
  Administered 2021-05-30: 500 [IU]

## 2021-05-30 MED ORDER — SODIUM CHLORIDE 0.9 % IV SOLN
80.0000 mg/m2 | Freq: Once | INTRAVENOUS | Status: AC
  Administered 2021-05-30: 132 mg via INTRAVENOUS
  Filled 2021-05-30: qty 22

## 2021-05-30 MED ORDER — DIPHENHYDRAMINE HCL 50 MG/ML IJ SOLN
25.0000 mg | Freq: Once | INTRAMUSCULAR | Status: AC
  Administered 2021-05-30: 25 mg via INTRAVENOUS
  Filled 2021-05-30: qty 1

## 2021-05-30 MED ORDER — SODIUM CHLORIDE 0.9% FLUSH
10.0000 mL | INTRAVENOUS | Status: DC | PRN
  Administered 2021-05-30: 10 mL

## 2021-05-30 NOTE — Progress Notes (Signed)
Ok to proceed with Taxol first per MD.   Acquanetta Belling, RPH, BCPS, BCOP 05/30/2021 9:06 AM

## 2021-05-30 NOTE — Patient Instructions (Signed)
[facility]   Discharge Instructions: Thank you for choosing [facility] to provide your oncology and hematology care.   If you have a lab appointment with the [facility], please go directly to the [facility] and check in at the registration area.   Wear comfortable clothing and clothing appropriate for easy access to any Portacath or PICC line.   We strive to give you quality time with your provider. You may need to reschedule your appointment if you arrive late (15 or more minutes).  Arriving late affects you and other patients whose appointments are after yours.  Also, if you miss three or more appointments without notifying the office, you may be dismissed from the clinic at the providers discretion.      For prescription refill requests, have your pharmacy contact our office and allow 72 hours for refills to be completed.    Today you received the following chemotherapy and/or immunotherapy agents: Ramucirumab (Cyramza) and Paclitaxel (Taxol)      To help prevent nausea and vomiting after your treatment, we encourage you to take your nausea medication as directed.  BELOW ARE SYMPTOMS THAT SHOULD BE REPORTED IMMEDIATELY: *FEVER GREATER THAN 100.4 F (38 C) OR HIGHER *CHILLS OR SWEATING *NAUSEA AND VOMITING THAT IS NOT CONTROLLED WITH YOUR NAUSEA MEDICATION *UNUSUAL SHORTNESS OF BREATH *UNUSUAL BRUISING OR BLEEDING *URINARY PROBLEMS (pain or burning when urinating, or frequent urination) *BOWEL PROBLEMS (unusual diarrhea, constipation, pain near the anus) TENDERNESS IN MOUTH AND THROAT WITH OR WITHOUT PRESENCE OF ULCERS (sore throat, sores in mouth, or a toothache) UNUSUAL RASH, SWELLING OR PAIN  UNUSUAL VAGINAL DISCHARGE OR ITCHING   Items with * indicate a potential emergency and should be followed up as soon as possible or go to the Emergency Department if any problems should occur.  Please show the CHEMOTHERAPY ALERT CARD or  IMMUNOTHERAPY ALERT CARD at check-in to the Emergency Department and triage nurse.  Should you have questions after your visit or need to cancel or reschedule your appointment, please contact [facility]  Dept: [phone]  and follow the prompts.  Office hours are 8:00 a.m. to 4:30 p.m. Monday - Friday. Please note that voicemails left after 4:00 p.m. may not be returned until the following business day.  We are closed weekends and major holidays. You have access to a nurse at all times for urgent questions. Please call the main number to the clinic Dept: [phone] and follow the prompts.   For any non-urgent questions, you may also contact your provider using MyChart. We now offer e-Visits for anyone 48 and older to request care online for non-urgent symptoms. For details visit [email or web address].   Also download the MyChart app! Go to the app store, search "MyChart", open the app, select [facility], and log in with your MyChart username and password.  Due to Covid, a mask is required upon entering the hospital/clinic. If you do not have a mask, one will be given to you upon arrival. For doctor visits, patients may have 1 support person aged 80 or older with them. For treatment visits, patients cannot have anyone with them due to current Covid guidelines and our immunocompromised population.

## 2021-05-31 ENCOUNTER — Telehealth: Payer: Self-pay

## 2021-05-31 NOTE — Telephone Encounter (Signed)
-----   Message from Severiano Gilbert, RN sent at 05/30/2021  2:29 PM EST ----- Regarding: First Time Cyramza and Taxol - Dr. Burr Medico First Time Cyramza and Taxol - Dr. Patric Dykes well

## 2021-05-31 NOTE — Telephone Encounter (Signed)
Sheryl Pratt states that she feels good. No N/V. She is eating, drinking and urinating well. She feels that the decadron IV yesterday gave her some strength. She was wondering if Dr. Burr Medico could give her a prescription for oral decadron to take. Told her to discuss this at her next visit with Dr. Burr Medico or message Dr. Burr Medico on My Chart.  Told Sheryl Gayheart that usually the physicians usually do not give the decadron regularly as it may have other adverse side effects. Pt verbalized understanding. Sheryl Bal states that is knows to call [phone] if she has any questions or concerns

## 2021-06-02 ENCOUNTER — Other Ambulatory Visit: Payer: Self-pay

## 2021-06-02 ENCOUNTER — Telehealth: Payer: Self-pay

## 2021-06-02 ENCOUNTER — Other Ambulatory Visit: Payer: Self-pay | Admitting: Hematology

## 2021-06-02 DIAGNOSIS — C786 Secondary malignant neoplasm of retroperitoneum and peritoneum: Secondary | ICD-10-CM

## 2021-06-02 NOTE — Telephone Encounter (Signed)
Spoke with pt via telephone regarding appt on Monday 06/05/2021 at [facility] for Thoracentesis.  Pt is scheduled at 2pm.  Pt confirmed appt for Monday 06/05/2021.

## 2021-06-02 NOTE — Telephone Encounter (Addendum)
Staff message from [facility] stating the following:  ----- Message -----  From: Elliot Gault  Sent: 06/02/2021   1:36 PM EST  To: Gaspar Bidding, [name], MD, *  Subject: MD denied                                       Good afternoon - I received a vm from the cw with [employer]. Stated that the MD denied the request of all 3 drugs being administered together. Stated that Opdivo is a single agent. They are aware that the drugs are being administered on separate days but they still consider this as one regimen. Our only option at this time is to file a Rapid Urgent Appeal.  Please let me know how you would like for me to proceed.   Thanks   Brandi     Dr. Burr Medico does not want to do a Rapid Appeal at this time; therefore, holding Nivolumab.  Discontinued Tx plan for Nivolumab.

## 2021-06-02 NOTE — Telephone Encounter (Signed)
Pt LVM stating she has a fever and feels she has fluid in her lungs.  Pt stated she was told in Chemo education class that if she spikes a fever to call Dr. Ernestina Penna office.

## 2021-06-05 ENCOUNTER — Other Ambulatory Visit: Payer: Self-pay

## 2021-06-05 ENCOUNTER — Ambulatory Visit (HOSPITAL_COMMUNITY)
Admission: RE | Admit: 2021-06-05 | Discharge: 2021-06-05 | Disposition: A | Discharge status: Home / Self Care | Payer: 59 | Source: Ambulatory Visit | Attending: Radiology | Admitting: Radiology

## 2021-06-05 ENCOUNTER — Ambulatory Visit (HOSPITAL_COMMUNITY)
Admission: RE | Admit: 2021-06-05 | Discharge: 2021-06-05 | Disposition: A | Discharge status: Home / Self Care | Payer: 59 | Source: Ambulatory Visit | Attending: Hematology | Admitting: Hematology

## 2021-06-05 DIAGNOSIS — Z9889 Other specified postprocedural states: Secondary | ICD-10-CM

## 2021-06-05 DIAGNOSIS — J90 Pleural effusion, not elsewhere classified: Secondary | ICD-10-CM | POA: Diagnosis not present

## 2021-06-05 DIAGNOSIS — C786 Secondary malignant neoplasm of retroperitoneum and peritoneum: Secondary | ICD-10-CM | POA: Insufficient documentation

## 2021-06-05 MED ORDER — LIDOCAINE HCL 1 % IJ SOLN
INTRAMUSCULAR | Status: AC
  Administered 2021-06-05: 10 mL
  Filled 2021-06-05: qty 20

## 2021-06-05 NOTE — Procedures (Signed)
Ultrasound-guided therapeutic left thoracentesis performed yielding 600 cc of hazy, amber colored fluid. No immediate complications. Follow-up chest x-ray pending. EBL none.

## 2021-06-06 MED FILL — Dexamethasone Sodium Phosphate Inj 100 MG/10ML: INTRAMUSCULAR | Qty: 1 | Status: AC

## 2021-06-07 ENCOUNTER — Other Ambulatory Visit: Payer: Self-pay

## 2021-06-07 ENCOUNTER — Inpatient Hospital Stay (HOSPITAL_BASED_OUTPATIENT_CLINIC_OR_DEPARTMENT_OTHER): Payer: 59 | Admitting: Hematology

## 2021-06-07 ENCOUNTER — Inpatient Hospital Stay: Payer: 59

## 2021-06-07 VITALS — BP 128/72 | HR 98 | Temp 98.5°F | Resp 22 | Wt 126.5 lb

## 2021-06-07 DIAGNOSIS — Z5112 Encounter for antineoplastic immunotherapy: Secondary | ICD-10-CM | POA: Diagnosis not present

## 2021-06-07 DIAGNOSIS — C786 Secondary malignant neoplasm of retroperitoneum and peritoneum: Secondary | ICD-10-CM

## 2021-06-07 DIAGNOSIS — C169 Malignant neoplasm of stomach, unspecified: Secondary | ICD-10-CM | POA: Diagnosis not present

## 2021-06-07 DIAGNOSIS — Z95828 Presence of other vascular implants and grafts: Secondary | ICD-10-CM

## 2021-06-07 LAB — CBC WITH DIFFERENTIAL/PLATELET
Abs Immature Granulocytes: 0.02 10*3/uL (ref 0.00–0.07)
Basophils Absolute: 0 10*3/uL (ref 0.0–0.1)
Basophils Relative: 1 %
Eosinophils Absolute: 0.2 10*3/uL (ref 0.0–0.5)
Eosinophils Relative: 2 %
HCT: 36.7 % (ref 36.0–46.0)
Hemoglobin: 12.3 g/dL (ref 12.0–15.0)
Immature Granulocytes: 0 %
Lymphocytes Relative: 11 %
Lymphs Abs: 0.7 10*3/uL (ref 0.7–4.0)
MCH: 34 pg (ref 26.0–34.0)
MCHC: 33.5 g/dL (ref 30.0–36.0)
MCV: 101.4 fL — ABNORMAL HIGH (ref 80.0–100.0)
Monocytes Absolute: 0.8 10*3/uL (ref 0.1–1.0)
Monocytes Relative: 11 %
Neutro Abs: 5 10*3/uL (ref 1.7–7.7)
Neutrophils Relative %: 75 %
Platelets: 332 10*3/uL (ref 150–400)
RBC: 3.62 MIL/uL — ABNORMAL LOW (ref 3.87–5.11)
RDW: 16.3 % — ABNORMAL HIGH (ref 11.5–15.5)
WBC: 6.7 10*3/uL (ref 4.0–10.5)
nRBC: 0 % (ref 0.0–0.2)

## 2021-06-07 LAB — COMPREHENSIVE METABOLIC PANEL
ALT: 15 U/L (ref 0–44)
AST: 20 U/L (ref 15–41)
Albumin: 3 g/dL — ABNORMAL LOW (ref 3.5–5.0)
Alkaline Phosphatase: 123 U/L (ref 38–126)
Anion gap: 6 (ref 5–15)
BUN: 16 mg/dL (ref 6–20)
CO2: 26 mmol/L (ref 22–32)
Calcium: 8.3 mg/dL — ABNORMAL LOW (ref 8.9–10.3)
Chloride: 103 mmol/L (ref 98–111)
Creatinine, Ser: 0.51 mg/dL (ref 0.44–1.00)
GFR, Estimated: >60 mL/min (ref >60)
Glucose, Bld: 126 mg/dL — ABNORMAL HIGH (ref 70–99)
Potassium: 3.9 mmol/L (ref 3.5–5.1)
Sodium: 135 mmol/L (ref 135–145)
Total Bilirubin: 0.3 mg/dL (ref 0.3–1.2)
Total Protein: 7.2 g/dL (ref 6.5–8.1)

## 2021-06-07 LAB — CEA (IN HOUSE-CHCC): CEA (CHCC-In House): 2.42 ng/mL (ref 0.00–5.00)

## 2021-06-07 LAB — TOTAL PROTEIN, URINE DIPSTICK: Protein, ur: <30 mg/dL

## 2021-06-07 MED ORDER — HEPARIN SOD (PORK) LOCK FLUSH 100 UNIT/ML IV SOLN
500.0000 [IU] | Freq: Once | INTRAVENOUS | Status: AC | PRN
  Administered 2021-06-07: 500 [IU]

## 2021-06-07 MED ORDER — SODIUM CHLORIDE 0.9 % IV SOLN
80.0000 mg/m2 | Freq: Once | INTRAVENOUS | Status: AC
  Administered 2021-06-07: 132 mg via INTRAVENOUS
  Filled 2021-06-07: qty 22

## 2021-06-07 MED ORDER — DIPHENHYDRAMINE HCL 50 MG/ML IJ SOLN
25.0000 mg | Freq: Once | INTRAMUSCULAR | Status: AC
  Administered 2021-06-07: 25 mg via INTRAVENOUS
  Filled 2021-06-07: qty 1

## 2021-06-07 MED ORDER — SODIUM CHLORIDE 0.9% FLUSH
10.0000 mL | Freq: Once | INTRAVENOUS | Status: AC
  Administered 2021-06-07: 10 mL

## 2021-06-07 MED ORDER — SODIUM CHLORIDE 0.9% FLUSH
10.0000 mL | INTRAVENOUS | Status: DC | PRN
  Administered 2021-06-07: 10 mL

## 2021-06-07 MED ORDER — ALBUTEROL SULFATE HFA 108 (90 BASE) MCG/ACT IN AERS: INHALATION_SPRAY | RESPIRATORY_TRACT | 1 refills | Status: AC

## 2021-06-07 MED ORDER — FAMOTIDINE IN NACL 20-0.9 MG/50ML-% IV SOLN
20.0000 mg | Freq: Once | INTRAVENOUS | Status: AC
  Administered 2021-06-07: 20 mg via INTRAVENOUS
  Filled 2021-06-07: qty 50

## 2021-06-07 MED ORDER — SODIUM CHLORIDE 0.9 % IV SOLN
10.0000 mg | Freq: Once | INTRAVENOUS | Status: AC
  Administered 2021-06-07: 10 mg via INTRAVENOUS
  Filled 2021-06-07: qty 10

## 2021-06-07 MED ORDER — SODIUM CHLORIDE 0.9 % IV SOLN: Freq: Once | INTRAVENOUS | Status: AC

## 2021-06-07 NOTE — Progress Notes (Signed)
[place]   [phone] [phone]   Clinic Follow up Note   Patient Care Team: Lorrene Reid, PA-C as PCP - General Christene Lye, MD (General Surgery) [facility] [place] [name], MD as Consulting Physician (Hematology and Oncology) [name], RN (Inactive) as Oncology Nurse Navigator [name], MD as Consulting Physician (Gynecologic Oncology)  Date of Service:  06/07/2021  CHIEF COMPLAINT: f/u of metastatic gastric adenocarcinoma  CURRENT THERAPY:  Second line chemo paclitaxel on day 1, 8, 15, Ramucirumab and Nivo on day 1, 15, every 28 days  ASSESSMENT & PLAN:  Sheryl Pratt is a 56 y.o. female with   1. 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 Enlarged left adrenal gland.    -On 08/09/20, she underwent diagnostic laparoscopy with right salpingo-oophorectomy to assure the origin of the mass. The large ovarian mass showed adenocarcinoma with signet ring cell features, IHC studies show liver upper GI primary. Biopsy of other pelvic tissue was negative for metastasis. -She underwent EGD with Dr. Ardis Hughs on 08/26/20. Final pathology showed poorly differentiated adenofibroma with signet rings features. This is the same as her ovarian pathology.  --FO results showed MSI stable disease with low mutation rate, no targetable mutation. HER2(-). Her PD-L1 was negative. -I started her on first-line chemo with FOLFOX every 2 weeks on 09/08/20. This was held while she was receiving palliative radiation. Dose reduced due to neuropathy and side effects. -she was switched to maintenance Xeloda on 02/09/21, dose decreased due to skin toxicity. -restaging CT/bone scan on 05/24/21 (chest CT on 05/19/21) showed  progression in bones and concern for malignant pleural and lymphangitic metastatic disease.  -she underwent thoracentesis on 2/3 and 2/13. Cytology from the first procedure confirmed metastatic carcinoma. I reviewed the results with her today. -we changed her treatment to second line chemotherapy paclitaxel, ramucirumab and nivolumab on 05/30/21. She tolerated well overall and notes the steroid was especially helpful. -labs reviewed, overall adequate to proceed with treatment.   2.  Dyspnea -She has developed significant dyspnea on exertion in the past few weeks, related to pleural effusion and lung metastasis.  -We checked her oxygen level when she walks, it dropped to 90% on room air (05/26/21) -I will refill her albuterol and order repeat thoracentesis to be done weekly.   3. Neuropathy G1 -she is currently taking B12, and she was prescribed gabapentin on 12/01/20, which has provided relief. -stable and controlled on gabapentin   4. Pulmonary nodules, h/o smoking  -she has a history of smoking, and only smokes an occasional cigarette now and uses nicotine gum. -pulmonary nodules first seen on CT CAP 12/2020, most of which are sub-solid.  -CT chest (PE protocol) on 05/19/21 showed an 11 mm lingular nodule.   5. Lower back and ribcage pain, secondary to bone mets -Improved with palliative radiation therapy to the right hip and left pelvis 09/30/20 through 10/26/20. -She began Zometa on 12/29/20. -on gabapentin and oxycodone for right hip and leg pain.    6. Social Support -She and her husband bought a house in [place], where their children live. They plan to retire there.  -She reports anxiety related to her treatments. I prescribed Xanax on 11/17/20. -she reports her insurance does not cover much in terms of her treatments. She notes she  has met with our financial counselor and was told her husband makes too much money for them to qualify for any assistance.   7. Family History of Cancer  -The  patient's brother had pancreatic cancer, diagnosed at age 46. She notes unknown cancer in another family member. -Genetic testing done 09/08/20 was negative, with VUS in MLH1 and LZTR1.     PLAN: -proceed with C1D8 paclitaxel today -I called in albuterol -I ordered weekly thoracentesis -lab, flush, f/u, and C1D15 paclitaxel, ramucirumab, and nivolumab next week   No problem-specific Assessment & Plan notes found for this encounter.   SUMMARY OF ONCOLOGIC HISTORY: Oncology History Overview Note  Cancer Staging Gastric adenocarcinoma [facility]) Staging form: Stomach, AJCC 8th Edition - Clinical stage from 08/26/2020: Stage IVB (cTX, cN0, pM1) - Signed by [name], MD on 09/06/2020 Stage prefix: Initial diagnosis Total positive nodes: 0 Histologic grade (G): G3 Histologic grading system: 3 grade system    Gastric adenocarcinoma ([place])  07/30/2020 Imaging   CT Abdomen and Pelvis with Contrast  IMPRESSION: 1. Large hypervascular soft tissue mass occupies the pelvis, compressing the urinary bladder and GI structures. No definite connection of this mass is seen to the vaginal cuff. The ovaries are not identified. 2. Enlarged left adrenal gland. Given the presence of soft tissue mass in the pelvis, this may represent metastatic disease to the adrenal gland. 3. Further evaluation with MRI of the abdomen and pelvis may be considered. Surgical consultation is also advised. 4. Right lower pole nonobstructive nephrolithiasis. 5. Aortic atherosclerosis.     08/09/2020 Procedure   Exploratory laparotomy with right salpingo-oophorectomy, omentectomy washings, peritoneal biopsies, and omentectomy under the care of Dr. Berline Lopes    08/09/2020 Pathology Results   FINAL MICROSCOPIC DIAGNOSIS:   A. ADNEXAL MASS, RIGHT, SALPINGO OOPHORECTOMY:  - Adenocarcinoma with signet ring cell features.  - No ovarian surface involvement identified.  - Benign Fallopian tube.  - See comment.   The right adnexal  mass is 17.8 cm in greatest dimension and is diffusely involved by adenocarcinoma with diffuse signet ring cell features. Immunohistochemistry shows positivity with cytokeratin 7 and cytokeratin 20.  The tumor is negative with estrogen receptor, GATA3, GCDFP, CD56, chromogranin, synaptic ficin, calretinin, inhibin, Napsin A, TTF-1, WT1 and PAX 8.  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  Possible primary sites  include upper gastrointestinal tract and pancreas.   08/19/2020 Initial Diagnosis   Adenocarcinoma ([place])   08/26/2020 Procedure   Upper Endoscopy by Dr Ardis Hughs  IMPRESSION - There were no overtly neoplastic findings. - There was mild, non-specific gastritis and bulbar duodenitis. Both sites were biopsied given large pelvic mass pathology.   08/26/2020 Pathology Results   Surgical [P], gastric antrum and gastric body bx - POORLY DIFFERENTIATED ADENOCARCINOMA WITH SIGNET RING FEATURES. - SEE MICROSCOPIC DESCRIPTION.    08/26/2020 Cancer Staging   Staging form: Stomach, AJCC 8th Edition - Clinical stage from 08/26/2020: Stage IVB (cTX, cN0, pM1) - Signed by [name], MD on 09/06/2020 Stage prefix: Initial diagnosis Total positive nodes: 0 Histologic grade (G): G3 Histologic grading system: 3 grade system    08/29/2020 PET scan   IMPRESSION: 1. Dominant finding is multifocal hypermetabolic skeletal metastasis. Lesions are subtly evident on CT with subtle sclerosis. Lesions include the pelvis, spine and ribs. 2. Hypermetabolic metastasis in the RIGHT femoral neck with sclerosis may be at risk for pathologic fracture. 3. Resection of large pelvic mass. No residual carcinoma evident within the  pelvis. 4. Benign LEFT adrenal adenoma. 5. Diffuse mild metabolic activity in the gastric fundus and proximal body is nonspecific. Recommend correlation with recent upper endoscopy.     09/08/2020 -  Chemotherapy   First-line FOLFOX  q2weeks starting 09/08/20. Held 09/30/20 to proceed with palliative radiation. Restarted 11/03/20    09/26/2020 Genetic Testing   Negative hereditary cancer genetic testing: no pathogenic variants detected in [facility] CustomNext-Cancer +RNAinsight Panel.  Variants of uncertain significance detected in LZTR1 at  p.K452E (c.1354A>G) and MLH1 at p.P747S (c.2239C>T).  The report date is September 21, 2020.   The CustomNext-Cancer +RNAinsight Panel offered by [facility] and includes sequencing and rearrangement analysis for the following 91 genes: AIP, ALK, APC, ATM, AXIN2, BAP1, BARD1, BLM, BMPR1A, BRCA1, BRCA2, BRIP1, CDC73, CDH1, CDK4, CDKN1B, CDKN2A, CHEK2, CTNNA1, DICER1, FANCC, FH, FLCN, GALNT12, KIF1B, LZTR1, MAX, MEN1, MET, MLH1, MRE11A, MSH2, MSH3, MSH6, MUTYH, NBN, NF1, NF2, NTHL1, PALB2, PHOX2B, PMS2, POT1, PRKAR1A, PTCH1, PTEN, RAD50, RAD51C, RAD51D, RB1, RECQL, RET, SDHA, SDHAF2, SDHB, SDHC, SDHD, SMAD4, SMARCA4, SMARCB1, SMARCE1, STK11, SUFU, TMEM127, TP53, TSC1, TSC2, VHL and XRCC2 (sequencing and deletion/duplication); CASR, CFTR, CPA1, CTRC, EGFR, EGLN1, FAM175A, HOXB13, KIT, MITF, MLH3, PALLD, PDGFRA, POLD1, POLE, PRSS1, RINT1, RPS20, SPINK1 and TERT (sequencing only); EPCAM and GREM1 (deletion/duplication only). RNA data is routinely analyzed for use in variant interpretation for all genes.   10/04/2020 - 10/26/2020 Radiation Therapy   Palliative Radiation to right hip/lumbar spine with Dr Lisbeth Renshaw   Site Technique Total Dose (Gy) Dose per Fx (Gy) Completed Fx Beam Energies  Femur Right: Ext_Rt Complex 37.5/37.5 2.5 15/15 15X  Pelvis: Pelvis_Left SI 3D 37.5/37.5 2.5 15/15 10X, 15X    12/28/2020 Imaging   CT CAP  IMPRESSION: 1. Extensive and diffuse sclerotic metastatic bone disease likely reflecting healing changes (lesions were largely lytic on the prior PET-CT). No pathologic fractures or spinal canal compromise. 2. No findings for abdominal/pelvic metastatic disease. 3. Numerous bilateral  subsolid/ground-glass pulmonary nodules will require attention on future scans. 4. Stable left adrenal gland adenoma.   Aortic Atherosclerosis (ICD10-I70.0) and Emphysema (ICD10-J43.9).   12/28/2020 Imaging   Bone Scan  IMPRESSION: Multifocal radiotracer accumulation in the axial and appendicular skeleton consistent with metastatic disease. Relatively diffuse uptake is identified in the right femoral neck, compatible with the sclerotic lesion seen on the 12/28/2020 CT exam.   05/19/2021 Imaging   CT SCAN OF THE CHEST WITH AND WITHOUT IV CONTRAST, CTA OF THE PULMONARY ARTERIES:   IMPRESSION:   3-D RECONSTRUCTIONS DEMONSTRATE NO EVIDENCE OF PULMONARY EMBOLUS.   SMALL TO MODERATE LEFT PLEURAL EFFUSION AND BIBASILAR ATELECTASIS/CONSOLIDATION.   METASTATIC DISEASE TO THE THORACIC SPINE AND RIBS.   11 MM LINGULAR NODULE ON IMAGE 88.    05/24/2021 Imaging   EXAM: CT ABDOMEN AND PELVIS WITH CONTRAST  IMPRESSION: 1. Interval increase in size and number of extensive sclerotic osseous metastatic lesions throughout the included skeleton, consistent with post treatment sclerosis. 2. There are new moderate left, small right pleural effusions and associated atelectasis or consolidation. Diffuse interlobular septal thickening and perhaps fine nodularity throughout the included bilateral lung bases. Findings are highly concerning for pleural and lymphangitic metastatic disease. 3. Nonobstructive right nephrolithiasis.   Aortic Atherosclerosis (ICD10-I70.0).   05/24/2021 Imaging   EXAM: NUCLEAR MEDICINE WHOLE BODY BONE SCAN  IMPRESSION: 1. Progressive bony metastatic disease, with new and enlarging areas of radiotracer uptake throughout the spine, thoracic cage, and pelvis as above.   05/26/2021 Pathology Results   A. PLEURAL FLUID, LEFT, THORACENTESIS:  FINAL MICROSCOPIC DIAGNOSIS:  - Malignant cells consistent with adenocarcinoma  The adenocarcinoma is immunophenotypically  consistent with a gastric primary. although the immunoprofile is not specific and may also be seen with pancreatic, biliary and small intestinal tumors.  The. tumor has the following immunophenotype:  CK7, positive; CK20, focally positive; CDX-2, positive; MOC-31, positive; TTF-1, negative; GATA-3, negative; PAX8, negative; calretinin, negative.  The immunohistochemical (IHC) stains have acceptable controls.    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 PLEURAL FLUID, LEFT, THORACENTESIS:    FINAL MICROSCOPIC DIAGNOSIS:  - Malignant cells consistent with adenocarcinoma  The adenocarcinoma is immunophenotypically consistent with a gastric primary. although the immunoprofile is not specific and may also be seen with pancreatic, biliary and small intestinal tumors.  The. tumor has the following immunophenotype:  CK7, positive; CK20, focally positive; CDX-2, positive; MOC-31, positive; TTF-1, negative; GATA-3, negative; PAX8, negative; calretinin, negative.  The immunohistochemical (IHC) stains have acceptable controls.    05/30/2021 -  Chemotherapy   Patient is on Treatment Plan : GASTROESOPHAGEAL Ramucirumab D1, 15  / PACLitaxel D1,8,15 q28d     06/12/2021 - 06/12/2021 Chemotherapy   Patient is on Treatment Plan : COLORECTAL Nivolumab q14d        INTERVAL HISTORY:  Sheryl Pratt is here for a follow up of metastatic gastric adenocarcinoma. She was last seen by me on 05/26/21. She was seen in the infusion area. She reports she felt somewhat better after her last thoracentesis. She notes her breathing is worse when she lies flat; she adds she has to prop herself up with pillows. She notes the steroid given with her chemo helped her the most.   All other systems were reviewed  with the patient and are negative.  MEDICAL HISTORY:  Past Medical History:  Diagnosis Date   Allergy    Arthritis    Bloating    Breast mass, right    Cancer ([place])    metastaic to bones and abd, May 2022   Constipation    Family history of gastric cancer 09/08/2020   Family history of ovarian cancer 09/08/2020   Family history of pancreatic cancer 09/08/2020   Family history of skin cancer 09/08/2020   Migraine    Pelvic mass    PONV (postoperative nausea and vomiting)    Seasonal allergies    Vitamin D deficiency     SURGICAL HISTORY: Past Surgical History:  Procedure Laterality Date   APPENDECTOMY     AUGMENTATION MAMMAPLASTY Bilateral    BREAST BIOPSY Right 2014   fibroadenoma   BREAST SURGERY  18 years ago   augmentation.   IR IMAGING GUIDED PORT INSERTION  09/06/2020   LAPAROTOMY  08/09/2020   Procedure: EXPLORATORY LAPAROTOMY, MASS EXCISION, PERITONEAL BIOPSIES;  Surgeon: [name], MD;  Location: [facility];  Service: Gynecology;;   OMENTECTOMY N/A 08/09/2020   Procedure: OMENTECTOMY;  Surgeon: [name], MD;  Location: [facility];  Service: Gynecology;  Laterality: N/A;   SALPINGOOPHORECTOMY Right 08/09/2020   Procedure: OPEN RIGHT SALPINGO OOPHORECTOMY;  Surgeon: [name], MD;  Location: [facility];  Service: Gynecology;  Laterality: Right;   VAGINAL HYSTERECTOMY  2015    I have reviewed the social history and family history with the patient and they  are unchanged from previous note.  ALLERGIES:  is allergic to codeine and wellbutrin [bupropion].  MEDICATIONS:  Current Outpatient Medications  Medication Sig Dispense Refill   acetaminophen (TYLENOL) 325 MG tablet Take by mouth every 6 (six) hours as needed for mild pain.     albuterol (VENTOLIN HFA) 108 (90 Base) MCG/ACT inhaler SMARTSIG:2 Inhalation By Mouth Every 4-6 Hours PRN 1 each 1   ALPRAZolam (XANAX) 0.25 MG tablet Take 1 tablet (0.25 mg total) by mouth 2 (two) times daily as needed for  anxiety. 20 tablet 0   capecitabine (XELODA) 500 MG tablet TAKE 2 TABLETS BY MOUTH IN THE  MORNING AND 3 TABLETS BY MOUTH  IN THE EVENING AFTER MEALS FOR  14 DAYS ON THEN 7 DAYS OFF AND  REPEAT EVERY 21 DAYS 70 tablet 0   cholecalciferol (VITAMIN D3) 25 MCG (1000 UNIT) tablet Take 1,000 Units by mouth daily.     diphenhydrAMINE (BENADRYL) 25 mg capsule Take 25 mg by mouth at bedtime as needed for allergies.     gabapentin (NEURONTIN) 100 MG capsule Take 2 capsules (200 mg total) by mouth 2 (two) times daily. 120 capsule 1   ibuprofen (ADVIL) 800 MG tablet Take 1 tablet (800 mg total) by mouth every 8 (eight) hours as needed for moderate pain. For AFTER surgery only 30 tablet 0   Ibuprofen-diphenhydrAMINE HCl (IBUPROFEN PM) 200-25 MG CAPS Take 1 tablet by mouth at bedtime as needed (sleep).     ipratropium (ATROVENT) 0.03 % nasal spray Place 2 sprays into both nostrils every 12 (twelve) hours. 30 mL 0   Multiple Vitamin (MULTIVITAMIN) tablet Take 1 tablet by mouth daily.     oxyCODONE (OXY IR/ROXICODONE) 5 MG immediate release tablet Take 1 tablet (5 mg total) by mouth every 4 (four) hours as needed for severe pain. For severe pain only, do not take and drive 20 tablet 0   predniSONE (DELTASONE) 20 MG tablet Take 2 tablets (40 mg total) by mouth daily with breakfast. 6 tablet 0   Psyllium (METAMUCIL PO) Take 1 Dose by mouth daily.     senna-docusate (SENOKOT-S) 8.6-50 MG tablet Take 2 tablets by mouth at bedtime. Do not take if having diarrhea (Patient taking differently: Take 2 tablets by mouth daily. Do not take if having diarrhea) 60 tablet 0   Tetrahydrozoline HCl (VISINE OP) Place 1 drop into both eyes daily as needed (itching Daralene Milch).     urea (CARMOL) 10 % cream Apply topically 2 (two) times daily. To palms and bottom of feet 71 g 0   vitamin B-12 (CYANOCOBALAMIN) 1000 MCG tablet Take 1,000 mcg by mouth daily.     vitamin C (ASCORBIC ACID) 500 MG tablet Take 500 mg by mouth daily.      vitamin E 180 MG (400 UNITS) capsule Take 400 Units by mouth daily.     No current facility-administered medications for this visit.   Facility-Administered Medications Ordered in Other Visits  Medication Dose Route Frequency Provider Last Rate Last Admin   heparin lock flush 100 unit/mL  500 Units Intracatheter Once PRN [name], MD       PACLitaxel (TAXOL) 132 mg in sodium chloride 0.9 % 250 mL chemo infusion (</= 53m/m2)  80 mg/m2 (Treatment Plan Recorded) Intravenous Once FTruitt Merle MD 272 mL/hr at 06/07/21 1015 132 mg at 06/07/21 1015   sodium chloride flush (NS) 0.9 % injection 10 mL  10 mL Intracatheter PRN FTruitt Merle MD  PHYSICAL EXAMINATION: ECOG PERFORMANCE STATUS: 3 - Symptomatic, >50% confined to bed  There were no vitals filed for this visit. Wt Readings from Last 3 Encounters:  06/07/21 126 lb 8 oz (57.4 kg)  05/30/21 126 lb 8 oz (57.4 kg)  05/26/21 130 lb 4.8 oz (59.1 kg)     GENERAL:alert, no distress and comfortable SKIN: skin color, texture, turgor are normal, no rashes or significant lesions EYES: normal, Conjunctiva are pink and non-injected, sclera clear  LUNGS: clear to auscultation and percussion with normal breathing effort, no wheezing, slightly decreased breath sound on both bottom of lungs  HEART: regular rate & rhythm and no murmurs and no lower extremity edema NEURO: alert & oriented x 3 with fluent speech, no focal motor/sensory deficits  LABORATORY DATA:  I have reviewed the data as listed CBC Latest Ref Rng & Units 06/07/2021 05/26/2021 04/10/2021  WBC 4.0 - 10.5 K/uL 6.7 7.9 4.1  Hemoglobin 12.0 - 15.0 g/dL 12.3 12.2 11.7(L)  Hematocrit 36.0 - 46.0 % 36.7 36.6 35.4(L)  Platelets 150 - 400 K/uL 332 459(H) 199     CMP Latest Ref Rng & Units 06/07/2021 05/26/2021 04/10/2021  Glucose 70 - 99 mg/dL 126(H) 82 104(H)  BUN 6 - 20 mg/dL _0 Creatinine 0.44 - 1.00 mg/dL 0.51 0.64 0.62  Sodium 135 - 145 mmol/L 135 140 143  Potassium 3.5 - 5.1  mmol/L 3.9 4.3 4.0  Chloride 98 - 111 mmol/L 103 105 109  CO2 22 - 32 mmol/L _1 Calcium 8.9 - 10.3 mg/dL 8.3(L) 8.9 8.7(L)  Total Protein 6.5 - 8.1 g/dL 7.2 7.2 7.0  Total Bilirubin 0.3 - 1.2 mg/dL 0.3 0.4 0.3  Alkaline Phos 38 - 126 U/L 123 132(H) 140(H)  AST 15 - 41 U/L 20 14(L) 24  ALT 0 - 44 U/L _2 RADIOGRAPHIC STUDIES: I have personally reviewed the radiological images as listed and agreed with the findings in the report. DG Chest 1 View  Result Date: 06/05/2021 CLINICAL DATA:  Post LEFT thoracentesis EXAM: CHEST  1 VIEW COMPARISON:  05/26/2021 FINDINGS: RIGHT jugular Port-A-Cath with tip projecting over SVC. Normal heart size, mediastinal contours, and pulmonary vascularity. Persistent bronchitic and interstitial changes. Subsegmental atelectasis at LEFT base. No pleural effusion or pneumothorax. Bones demineralized. IMPRESSION: Persistent bronchitic and interstitial changes. No pneumothorax following LEFT thoracentesis. Electronically Signed   By: Lavonia Dana M.D.   On: 06/05/2021 15:05   US Thoracentesis Asp Pleural space w/IMG guide  Result Date: 06/05/2021 INDICATION: Patient with history of metastatic gastric adenocarcinoma with recurrent malignant left pleural effusion, dyspnea. Request received for therapeutic left thoracentesis. EXAM: ULTRASOUND GUIDED THERAPEUTIC LEFT THORACENTESIS MEDICATIONS: 10 mL 1% lidocaine COMPLICATIONS: None immediate. PROCEDURE: An ultrasound guided thoracentesis was thoroughly discussed with the patient and questions answered. The benefits, risks, alternatives and complications were also discussed. The patient understands and wishes to proceed with the procedure. Written consent was obtained. Ultrasound was performed to localize and mark an adequate pocket of fluid in the left chest. The area was then prepped and draped in the normal sterile fashion. 1% Lidocaine was used for local anesthesia. Under ultrasound guidance a 6 Fr  Safe-T-Centesis catheter was introduced. Thoracentesis was performed. The catheter was removed and a dressing applied. FINDINGS: A total of approximately 600 cc of hazy, amber fluid was removed. IMPRESSION: Successful ultrasound guided therapeutic left thoracentesis yielding 600 cc of pleural fluid. Read by: Rowe Robert, PA-C Electronically Signed  By: Ruthann Cancer M.D.   On: 06/05/2021 15:06      Orders Placed This Encounter  Procedures   US Thoracentesis Asp Pleural space w/IMG guide    Standing Status:   Standing    Number of Occurrences:   5    Standing Expiration Date:   12/05/2021    Order Specific Question:   Are labs required for specimen collection?    Answer:   No    Order Specific Question:   Reason for Exam (SYMPTOM  OR DIAGNOSIS REQUIRED)    Answer:   symptom relieve    Order Specific Question:   Preferred imaging location?    Answer:   [facility]   All questions were answered. The patient knows to call the clinic with any problems, questions or concerns. No barriers to learning was detected. The total time spent in the appointment was 30 minutes.     [name], MD 06/07/2021   I, Wilburn Mylar, am acting as scribe for [name], MD.   I have reviewed the above documentation for accuracy and completeness, and I agree with the above.

## 2021-06-07 NOTE — Patient Instructions (Signed)
[facility]  Discharge Instructions: Thank you for choosing [facility] to provide your oncology and hematology care.   If you have a lab appointment with the Cancer Center, please go directly to the Cancer Center and check in at the registration area.   Wear comfortable clothing and clothing appropriate for easy access to any Portacath or PICC line.   We strive to give you quality time with your provider. You may need to reschedule your appointment if you arrive late (15 or more minutes).  Arriving late affects you and other patients whose appointments are after yours.  Also, if you miss three or more appointments without notifying the office, you may be dismissed from the clinic at the provider's discretion.      For prescription refill requests, have your pharmacy contact our office and allow 72 hours for refills to be completed.    Today you received the following chemotherapy and/or immunotherapy agent: Paclitaxel (Taxol)   To help prevent nausea and vomiting after your treatment, we encourage you to take your nausea medication as directed.  BELOW ARE SYMPTOMS THAT SHOULD BE REPORTED IMMEDIATELY: *FEVER GREATER THAN 100.4 F (38 C) OR HIGHER *CHILLS OR SWEATING *NAUSEA AND VOMITING THAT IS NOT CONTROLLED WITH YOUR NAUSEA MEDICATION *UNUSUAL SHORTNESS OF BREATH *UNUSUAL BRUISING OR BLEEDING *URINARY PROBLEMS (pain or burning when urinating, or frequent urination) *BOWEL PROBLEMS (unusual diarrhea, constipation, pain near the anus) TENDERNESS IN MOUTH AND THROAT WITH OR WITHOUT PRESENCE OF ULCERS (sore throat, sores in mouth, or a toothache) UNUSUAL RASH, SWELLING OR PAIN  UNUSUAL VAGINAL DISCHARGE OR ITCHING   Items with * indicate a potential emergency and should be followed up as soon as possible or go to the Emergency Department if any problems should occur.  Please show the CHEMOTHERAPY ALERT CARD or IMMUNOTHERAPY ALERT CARD at  check-in to the Emergency Department and triage nurse.  Should you have questions after your visit or need to cancel or reschedule your appointment, please contact [facility]  Dept: [phone]  and follow the prompts.  Office hours are 8:00 a.m. to 4:30 p.m. Monday - Friday. Please note that voicemails left after 4:00 p.m. may not be returned until the following business day.  We are closed weekends and major holidays. You have access to a nurse at all times for urgent questions. Please call the main number to the clinic Dept: [phone] and follow the prompts.   For any non-urgent questions, you may also contact your provider using MyChart. We now offer e-Visits for anyone 18 and older to request care online for non-urgent symptoms. For details visit [email or web address].   Also download the MyChart app! Go to the app store, search "MyChart", open the app, select , and log in with your MyChart username and password.  Due to Covid, a mask is required upon entering the hospital/clinic. If you do not have a mask, one will be given to you upon arrival. For doctor visits, patients may have 1 support person aged 18 or older with them. For treatment visits, patients cannot have anyone with them due to current Covid guidelines and our immunocompromised population.  

## 2021-06-09 ENCOUNTER — Telehealth: Payer: Self-pay | Admitting: Hematology

## 2021-06-09 NOTE — Telephone Encounter (Signed)
Scheduled follow-up appointments per 2/15 los. Patient is aware.

## 2021-06-12 ENCOUNTER — Other Ambulatory Visit (HOSPITAL_COMMUNITY): Payer: 59

## 2021-06-13 ENCOUNTER — Ambulatory Visit (HOSPITAL_COMMUNITY): Payer: 59

## 2021-06-13 MED FILL — Dexamethasone Sodium Phosphate Inj 100 MG/10ML: INTRAMUSCULAR | Qty: 1 | Status: AC

## 2021-06-14 ENCOUNTER — Inpatient Hospital Stay (HOSPITAL_BASED_OUTPATIENT_CLINIC_OR_DEPARTMENT_OTHER): Payer: 59 | Admitting: Hematology

## 2021-06-14 ENCOUNTER — Inpatient Hospital Stay: Payer: 59

## 2021-06-14 ENCOUNTER — Encounter: Payer: Self-pay | Admitting: Hematology

## 2021-06-14 ENCOUNTER — Other Ambulatory Visit: Payer: Self-pay

## 2021-06-14 VITALS — BP 108/70 | HR 91 | Temp 97.7°F | Resp 20 | Wt 124.8 lb

## 2021-06-14 DIAGNOSIS — C786 Secondary malignant neoplasm of retroperitoneum and peritoneum: Secondary | ICD-10-CM

## 2021-06-14 DIAGNOSIS — C169 Malignant neoplasm of stomach, unspecified: Secondary | ICD-10-CM | POA: Diagnosis not present

## 2021-06-14 DIAGNOSIS — Z5112 Encounter for antineoplastic immunotherapy: Secondary | ICD-10-CM | POA: Diagnosis not present

## 2021-06-14 DIAGNOSIS — Z95828 Presence of other vascular implants and grafts: Secondary | ICD-10-CM

## 2021-06-14 LAB — COMPREHENSIVE METABOLIC PANEL
ALT: 12 U/L (ref 0–44)
AST: 15 U/L (ref 15–41)
Albumin: 3.5 g/dL (ref 3.5–5.0)
Alkaline Phosphatase: 126 U/L (ref 38–126)
Anion gap: 6 (ref 5–15)
BUN: 14 mg/dL (ref 6–20)
CO2: 29 mmol/L (ref 22–32)
Calcium: 8.9 mg/dL (ref 8.9–10.3)
Chloride: 103 mmol/L (ref 98–111)
Creatinine, Ser: 0.46 mg/dL (ref 0.44–1.00)
GFR, Estimated: >60 mL/min (ref >60)
Glucose, Bld: 116 mg/dL — ABNORMAL HIGH (ref 70–99)
Potassium: 4.2 mmol/L (ref 3.5–5.1)
Sodium: 138 mmol/L (ref 135–145)
Total Bilirubin: 0.3 mg/dL (ref 0.3–1.2)
Total Protein: 7.2 g/dL (ref 6.5–8.1)

## 2021-06-14 LAB — CBC WITH DIFFERENTIAL/PLATELET
Abs Immature Granulocytes: 0.03 10*3/uL (ref 0.00–0.07)
Basophils Absolute: 0.1 10*3/uL (ref 0.0–0.1)
Basophils Relative: 1 %
Eosinophils Absolute: 0.1 10*3/uL (ref 0.0–0.5)
Eosinophils Relative: 2 %
HCT: 36.7 % (ref 36.0–46.0)
Hemoglobin: 12.1 g/dL (ref 12.0–15.0)
Immature Granulocytes: 1 %
Lymphocytes Relative: 20 %
Lymphs Abs: 1.1 10*3/uL (ref 0.7–4.0)
MCH: 33.2 pg (ref 26.0–34.0)
MCHC: 33 g/dL (ref 30.0–36.0)
MCV: 100.5 fL — ABNORMAL HIGH (ref 80.0–100.0)
Monocytes Absolute: 0.6 10*3/uL (ref 0.1–1.0)
Monocytes Relative: 10 %
Neutro Abs: 3.7 10*3/uL (ref 1.7–7.7)
Neutrophils Relative %: 66 %
Platelets: 413 10*3/uL — ABNORMAL HIGH (ref 150–400)
RBC: 3.65 MIL/uL — ABNORMAL LOW (ref 3.87–5.11)
RDW: 16 % — ABNORMAL HIGH (ref 11.5–15.5)
WBC: 5.5 10*3/uL (ref 4.0–10.5)
nRBC: 0 % (ref 0.0–0.2)

## 2021-06-14 LAB — TOTAL PROTEIN, URINE DIPSTICK: Protein, ur: NEGATIVE mg/dL

## 2021-06-14 MED ORDER — SODIUM CHLORIDE 0.9 % IV SOLN
8.0000 mg/kg | Freq: Once | INTRAVENOUS | Status: AC
  Administered 2021-06-14: 500 mg via INTRAVENOUS
  Filled 2021-06-14: qty 50

## 2021-06-14 MED ORDER — DIPHENHYDRAMINE HCL 50 MG/ML IJ SOLN
25.0000 mg | Freq: Once | INTRAMUSCULAR | Status: AC
  Administered 2021-06-14: 25 mg via INTRAVENOUS
  Filled 2021-06-14: qty 1

## 2021-06-14 MED ORDER — FAMOTIDINE IN NACL 20-0.9 MG/50ML-% IV SOLN
20.0000 mg | Freq: Once | INTRAVENOUS | Status: AC
  Administered 2021-06-14: 20 mg via INTRAVENOUS
  Filled 2021-06-14: qty 50

## 2021-06-14 MED ORDER — SODIUM CHLORIDE 0.9 % IV SOLN
10.0000 mg | Freq: Once | INTRAVENOUS | Status: AC
  Administered 2021-06-14: 10 mg via INTRAVENOUS
  Filled 2021-06-14: qty 10

## 2021-06-14 MED ORDER — SODIUM CHLORIDE 0.9 % IV SOLN
80.0000 mg/m2 | Freq: Once | INTRAVENOUS | Status: AC
  Administered 2021-06-14: 132 mg via INTRAVENOUS
  Filled 2021-06-14: qty 22

## 2021-06-14 MED ORDER — SODIUM CHLORIDE 0.9% FLUSH
10.0000 mL | Freq: Once | INTRAVENOUS | Status: AC
  Administered 2021-06-14: 10 mL

## 2021-06-14 MED ORDER — SODIUM CHLORIDE 0.9% FLUSH
10.0000 mL | INTRAVENOUS | Status: DC | PRN
  Administered 2021-06-14: 10 mL

## 2021-06-14 MED ORDER — HEPARIN SOD (PORK) LOCK FLUSH 100 UNIT/ML IV SOLN
500.0000 [IU] | Freq: Once | INTRAVENOUS | Status: AC | PRN
  Administered 2021-06-14: 500 [IU]

## 2021-06-14 MED ORDER — SODIUM CHLORIDE 0.9 % IV SOLN: Freq: Once | INTRAVENOUS | Status: AC

## 2021-06-14 NOTE — Patient Instructions (Signed)
[facility]  Discharge Instructions: Thank you for choosing [facility] to provide your oncology and hematology care.   If you have a lab appointment with the [facility], please go directly to the [facility] and check in at the registration area.   Wear comfortable clothing and clothing appropriate for easy access to any Portacath or PICC line.   We strive to give you quality time with your provider. You may need to reschedule your appointment if you arrive late (15 or more minutes).  Arriving late affects you and other patients whose appointments are after yours.  Also, if you miss three or more appointments without notifying the office, you may be dismissed from the clinic at the providers discretion.      For prescription refill requests, have your pharmacy contact our office and allow 72 hours for refills to be completed.    Today you received the following chemotherapy and/or immunotherapy agents: Cyramza, Paclitaxel.      To help prevent nausea and vomiting after your treatment, we encourage you to take your nausea medication as directed.  BELOW ARE SYMPTOMS THAT SHOULD BE REPORTED IMMEDIATELY: *FEVER GREATER THAN 100.4 F (38 C) OR HIGHER *CHILLS OR SWEATING *NAUSEA AND VOMITING THAT IS NOT CONTROLLED WITH YOUR NAUSEA MEDICATION *UNUSUAL SHORTNESS OF BREATH *UNUSUAL BRUISING OR BLEEDING *URINARY PROBLEMS (pain or burning when urinating, or frequent urination) *BOWEL PROBLEMS (unusual diarrhea, constipation, pain near the anus) TENDERNESS IN MOUTH AND THROAT WITH OR WITHOUT PRESENCE OF ULCERS (sore throat, sores in mouth, or a toothache) UNUSUAL RASH, SWELLING OR PAIN  UNUSUAL VAGINAL DISCHARGE OR ITCHING   Items with * indicate a potential emergency and should be followed up as soon as possible or go to the Emergency Department if any problems should occur.  Please show the CHEMOTHERAPY ALERT CARD or IMMUNOTHERAPY ALERT CARD at  check-in to the Emergency Department and triage nurse.  Should you have questions after your visit or need to cancel or reschedule your appointment, please contact [facility]  Dept: [phone]  and follow the prompts.  Office hours are 8:00 a.m. to 4:30 p.m. Monday - Friday. Please note that voicemails left after 4:00 p.m. may not be returned until the following business day.  We are closed weekends and major holidays. You have access to a nurse at all times for urgent questions. Please call the main number to the clinic Dept: [phone] and follow the prompts.   For any non-urgent questions, you may also contact your provider using MyChart. We now offer e-Visits for anyone 54 and older to request care online for non-urgent symptoms. For details visit [email or web address].   Also download the MyChart app! Go to the app store, search "MyChart", open the app, select [facility], and log in with your MyChart username and password.  Due to Covid, a mask is required upon entering the hospital/clinic. If you do not have a mask, one will be given to you upon arrival. For doctor visits, patients may have 1 support person aged 70 or older with them. For treatment visits, patients cannot have anyone with them due to current Covid guidelines and our immunocompromised population.

## 2021-06-14 NOTE — Progress Notes (Signed)
°[facility]   °[phone] [phone]   °Clinic Follow up Note  ° °Patient Care Team: °Abonza, Maritza, PA-C as PCP - General °Sankar, Seeplaputhur G, MD (General Surgery) °[facility] °Feng, Yan, MD as Consulting Physician (Hematology and Oncology) °Anderson, Cheryl L, RN (Inactive) as Oncology Nurse Navigator °Tucker, Katherine R, MD as Consulting Physician (Gynecologic Oncology) ° °Date of Service:  06/14/2021 ° °CHIEF COMPLAINT: f/u of metastatic gastric adenocarcinoma ° °CURRENT THERAPY:  °Second line chemo paclitaxel on day 1, 8, 15, Ramucirumab and Nivo on day 1, 15, every 28 days, starting 05/30/21 ° °ASSESSMENT & PLAN:  °Sheryl Pratt is a 56 y.o. female with  ° °1. 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 Enlarged left adrenal gland.    °-On 08/09/20, she underwent diagnostic laparoscopy with right salpingo-oophorectomy to assure the origin of the mass. The large ovarian mass showed adenocarcinoma with signet ring cell features, IHC studies show liver upper GI primary. Biopsy of other pelvic tissue was negative for metastasis. °-She underwent EGD with Dr. Jacobs on 08/26/20. Final pathology showed poorly differentiated adenofibroma with signet rings features. This is the same as her ovarian pathology.  °--FO results showed MSI stable disease with low mutation rate, no targetable mutation. HER2(-). Her PD-L1 was negative. °-I started her on first-line chemo with FOLFOX every 2 weeks on 09/08/20. This was held while she was receiving palliative radiation. Dose reduced due to neuropathy and side effects. °-she was switched to maintenance Xeloda on 02/09/21, dose decreased due to skin toxicity. °-restaging CT/bone scan on 05/24/21 (chest CT on  05/19/21) showed progression in bones and concern for malignant pleural and lymphangitic metastatic disease.  °-she underwent thoracentesis on 2/3 and 2/13. Cytology from the first procedure confirmed metastatic carcinoma. -we changed her treatment to second line chemotherapy paclitaxel, ramucirumab and nivolumab on 05/30/21. She tolerated well overall and notes the steroid was especially helpful. °-Her dyspnea has improved since she start treatment, her main AE is fatigue  °-labs reviewed, overall adequate to proceed with treatment. °-if fatigue gets much worse, will change treatment to every 2 weeks  °  °2.  Dyspnea °-She has developed significant dyspnea on exertion in the past few weeks, related to pleural effusion and lung metastasis.  °-We checked her oxygen level when she walks, it dropped to 90% on room air (05/26/21) °-I will refill her albuterol and order repeat thoracentesis to be done weekly. °  °3. Neuropathy G1 °-she is currently taking B12, and she was prescribed gabapentin on 12/01/20, which has provided relief. °-stable and controlled on gabapentin °  °4. Pulmonary nodules, h/o smoking  °-she has a history of smoking, and only smokes an occasional cigarette now and uses nicotine gum. °-pulmonary nodules first seen on CT CAP 12/2020, most of which are sub-solid.  °-CT chest (PE protocol) on 05/19/21 showed an 11 mm lingular nodule. °  °5. Lower back and ribcage pain, secondary to bone mets °-Improved with palliative radiation therapy to the right hip and left pelvis 09/30/20 through 10/26/20. °-She began Zometa on 12/29/20. °-on gabapentin and oxycodone for right hip and leg pain.  °  °6. Social Support °-She and her husband bought a house in [place], where their children live. They plan to retire there.  °-She reports anxiety related to her   her treatments. I prescribed Xanax on 11/17/20. -she reports her insurance does not cover much in terms of her treatments. She notes she has met with our financial counselor  and was told her husband makes too much money for them to qualify for any assistance.   7. Family History of Cancer  -The patient's brother had pancreatic cancer, diagnosed at age 53. She notes unknown cancer in another family member. -Genetic testing done 09/08/20 was negative, with VUS in MLH1 and LZTR1.     PLAN: -proceed with C1D15 paclitaxel today, off chemo next week  -thoracentesis as needed (she cancelled yesterday) -lab, flush, f/u, and C2D1 paclitaxel, ramucirumab, and nivolumab in 2 weeks as scheduled   No problem-specific Assessment & Plan notes found for this encounter.   SUMMARY OF ONCOLOGIC HISTORY: Oncology History Overview Note  Cancer Staging Gastric adenocarcinoma [facility]) Staging form: Stomach, AJCC 8th Edition - Clinical stage from 08/26/2020: Stage IVB (cTX, cN0, pM1) - Signed by [name], MD on 09/06/2020 Stage prefix: Initial diagnosis Total positive nodes: 0 Histologic grade (G): G3 Histologic grading system: 3 grade system    Gastric adenocarcinoma ([place])  07/30/2020 Imaging   CT Abdomen and Pelvis with Contrast  IMPRESSION: 1. Large hypervascular soft tissue mass occupies the pelvis, compressing the urinary bladder and GI structures. No definite connection of this mass is seen to the vaginal cuff. The ovaries are not identified. 2. Enlarged left adrenal gland. Given the presence of soft tissue mass in the pelvis, this may represent metastatic disease to the adrenal gland. 3. Further evaluation with MRI of the abdomen and pelvis may be considered. Surgical consultation is also advised. 4. Right lower pole nonobstructive nephrolithiasis. 5. Aortic atherosclerosis.     08/09/2020 Procedure   Exploratory laparotomy with right salpingo-oophorectomy, omentectomy washings, peritoneal biopsies, and omentectomy under the care of Dr. Berline Lopes    08/09/2020 Pathology Results   FINAL MICROSCOPIC DIAGNOSIS:   A. ADNEXAL MASS, RIGHT, SALPINGO OOPHORECTOMY:  -  Adenocarcinoma with signet ring cell features.  - No ovarian surface involvement identified.  - Benign Fallopian tube.  - See comment.   The right adnexal mass is 17.8 cm in greatest dimension and is diffusely involved by adenocarcinoma with diffuse signet ring cell features. Immunohistochemistry shows positivity with cytokeratin 7 and cytokeratin 20.  The tumor is negative with estrogen receptor, GATA3, GCDFP, CD56, chromogranin, synaptic ficin, calretinin, inhibin, Napsin A, TTF-1, WT1 and PAX 8.  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  Possible primary sites  include upper gastrointestinal tract and pancreas.   08/19/2020 Initial Diagnosis   Adenocarcinoma ([place])   08/26/2020 Procedure   Upper Endoscopy by Dr Ardis Hughs  IMPRESSION - There were no overtly neoplastic findings. - There was mild, non-specific gastritis and bulbar duodenitis. Both sites were biopsied given large pelvic mass pathology.   08/26/2020 Pathology Results   Surgical [P], gastric antrum and gastric body bx - POORLY DIFFERENTIATED ADENOCARCINOMA WITH SIGNET RING FEATURES. - SEE MICROSCOPIC DESCRIPTION.    08/26/2020 Cancer Staging   Staging form: Stomach, AJCC 8th Edition - Clinical stage from 08/26/2020: Stage IVB (cTX, cN0, pM1) - Signed by [name], MD on 09/06/2020 Stage prefix: Initial diagnosis Total positive nodes: 0 Histologic grade (G): G3 Histologic grading system: 3 grade system    08/29/2020 PET scan   IMPRESSION: 1. Dominant finding is multifocal hypermetabolic skeletal metastasis. Lesions are subtly evident on CT with subtle sclerosis. Lesions include the pelvis, spine and ribs.  Hypermetabolic metastasis in the RIGHT femoral neck with °sclerosis may be at risk for pathologic fracture. °3. Resection of large pelvic mass. No residual carcinoma evident °within the pelvis. °4. Benign LEFT adrenal adenoma. °5. Diffuse mild metabolic activity in  the gastric fundus and °proximal body is nonspecific. Recommend correlation with recent °upper endoscopy. °  °  °09/08/2020 -  Chemotherapy  ° First-line FOLFOX q2weeks starting 09/08/20. Held 09/30/20 to proceed with palliative radiation. Restarted 11/03/20 ° °  °09/26/2020 Genetic Testing  ° Negative hereditary cancer genetic testing: no pathogenic variants detected in [facility] [employer]-Cancer +RNAinsight Panel.  Variants of uncertain significance detected in LZTR1 at  p.K452E (c.1354A>G) and MLH1 at p.P747S (c.2239C>T).  The report date is September 21, 2020.  ° °The CustomNext-Cancer +RNAinsight Panel offered by [facility] and includes sequencing and rearrangement analysis for the following 91 genes: AIP, ALK, APC, ATM, AXIN2, BAP1, BARD1, BLM, BMPR1A, BRCA1, BRCA2, BRIP1, CDC73, CDH1, CDK4, CDKN1B, CDKN2A, CHEK2, CTNNA1, DICER1, FANCC, FH, FLCN, GALNT12, KIF1B, LZTR1, MAX, MEN1, MET, MLH1, MRE11A, MSH2, MSH3, MSH6, MUTYH, NBN, NF1, NF2, NTHL1, PALB2, PHOX2B, PMS2, POT1, PRKAR1A, PTCH1, PTEN, RAD50, RAD51C, RAD51D, RB1, RECQL, RET, SDHA, SDHAF2, SDHB, SDHC, SDHD, SMAD4, SMARCA4, SMARCB1, SMARCE1, STK11, SUFU, TMEM127, TP53, TSC1, TSC2, VHL and XRCC2 (sequencing and deletion/duplication); CASR, CFTR, CPA1, CTRC, EGFR, EGLN1, FAM175A, HOXB13, KIT, MITF, MLH3, PALLD, PDGFRA, POLD1, POLE, PRSS1, RINT1, RPS20, SPINK1 and TERT (sequencing only); EPCAM and GREM1 (deletion/duplication only). RNA data is routinely analyzed for use in variant interpretation for all genes. °  °10/04/2020 - 10/26/2020 Radiation Therapy  ° Palliative Radiation to right hip/lumbar spine with Dr Moody  ° °Site Technique Total Dose (Gy) Dose per Fx (Gy) Completed Fx Beam Energies  °Femur Right: Ext_Rt Complex 37.5/37.5 2.5 15/15 15X  °Pelvis: Pelvis_Left SI 3D 37.5/37.5 2.5 15/15 10X, 15X  °  °12/28/2020 Imaging  ° CT CAP ° °IMPRESSION: °1. Extensive and diffuse sclerotic metastatic bone disease likely °reflecting healing changes (lesions were largely lytic  on the prior °PET-CT). No pathologic fractures or spinal canal compromise. °2. No findings for abdominal/pelvic metastatic disease. °3. Numerous bilateral subsolid/ground-glass pulmonary nodules will require attention on future scans. °4. Stable left adrenal gland adenoma. °  °Aortic Atherosclerosis (ICD10-I70.0) and Emphysema (ICD10-J43.9). °  °12/28/2020 Imaging  ° Bone Scan ° °IMPRESSION: °Multifocal radiotracer accumulation in the axial and appendicular °skeleton consistent with metastatic disease. Relatively diffuse °uptake is identified in the right femoral neck, compatible with the °sclerotic lesion seen on the 12/28/2020 CT exam. °  °05/19/2021 Imaging  ° CT SCAN OF THE CHEST WITH AND WITHOUT IV CONTRAST, CTA OF THE PULMONARY ARTERIES:  ° °IMPRESSION:  ° °3-D RECONSTRUCTIONS DEMONSTRATE NO EVIDENCE OF PULMONARY EMBOLUS.  ° °SMALL TO MODERATE LEFT PLEURAL EFFUSION AND BIBASILAR ATELECTASIS/CONSOLIDATION.  ° °METASTATIC DISEASE TO THE THORACIC SPINE AND RIBS.  ° °11 MM LINGULAR NODULE ON IMAGE 88.  °  °05/24/2021 Imaging  ° EXAM: °CT ABDOMEN AND PELVIS WITH CONTRAST ° °IMPRESSION: °1. Interval increase in size and number of extensive sclerotic °osseous metastatic lesions throughout the included skeleton, °consistent with post treatment sclerosis. °2. There are new moderate left, small right pleural effusions and °associated atelectasis or consolidation. Diffuse interlobular septal °thickening and perhaps fine nodularity throughout the included °bilateral lung bases. Findings are highly concerning for pleural and °lymphangitic metastatic disease. °3. Nonobstructive right nephrolithiasis. °  °Aortic Atherosclerosis (ICD10-I70.0). °  °05/24/2021 Imaging  ° EXAM: °NUCLEAR MEDICINE WHOLE BODY BONE SCAN ° °IMPRESSION: °1. Progressive bony metastatic disease, with   with new and enlarging areas of radiotracer uptake throughout the spine, thoracic cage, and pelvis as above.   05/26/2021 Pathology Results   A. PLEURAL FLUID, LEFT,  THORACENTESIS:    FINAL MICROSCOPIC DIAGNOSIS:  - Malignant cells consistent with adenocarcinoma  The adenocarcinoma is immunophenotypically consistent with a gastric primary. although the immunoprofile is not specific and may also be seen with pancreatic, biliary and small intestinal tumors.  The. tumor has the following immunophenotype:  CK7, positive; CK20, focally positive; CDX-2, positive; MOC-31, positive; TTF-1, negative; GATA-3, negative; PAX8, negative; calretinin, negative.  The immunohistochemical (IHC) stains have acceptable controls.    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 PLEURAL FLUID, LEFT, THORACENTESIS:    FINAL MICROSCOPIC DIAGNOSIS:  - Malignant cells consistent with adenocarcinoma  The adenocarcinoma is immunophenotypically consistent with a gastric primary. although the immunoprofile is not specific and may also be seen with pancreatic, biliary and small intestinal tumors.  The. tumor has the following immunophenotype:  CK7, positive; CK20, focally positive; CDX-2, positive; MOC-31, positive; TTF-1, negative; GATA-3, negative; PAX8, negative; calretinin, negative.  The immunohistochemical (IHC) stains have acceptable controls.    05/30/2021 -  Chemotherapy   Patient is on Treatment Plan : GASTROESOPHAGEAL Ramucirumab D1, 15  / PACLitaxel D1,8,15 q28d     06/12/2021 - 06/12/2021 Chemotherapy   Patient is on Treatment Plan : COLORECTAL Nivolumab q14d        INTERVAL HISTORY:  Sheryl Pratt is here for a follow up of metastatic gastric adenocarcinoma. She was last seen by me on 06/07/21. She was seen in the infusion area. She notes she cancelled her thoracentesis on 2/17 because she didn't feel she needed it. She reports her breathing is  better. She reports intermittent headaches and fluctuating bowel movements.   All other systems were reviewed with the patient and are negative.  MEDICAL HISTORY:  Past Medical History:  Diagnosis Date   Allergy    Arthritis    Bloating    Breast mass, right    Cancer ([place])    metastaic to bones and abd, May 2022   Constipation    Family history of gastric cancer 09/08/2020   Family history of ovarian cancer 09/08/2020   Family history of pancreatic cancer 09/08/2020   Family history of skin cancer 09/08/2020   Migraine    Pelvic mass    PONV (postoperative nausea and vomiting)    Seasonal allergies    Vitamin D deficiency     SURGICAL HISTORY: Past Surgical History:  Procedure Laterality Date   APPENDECTOMY     AUGMENTATION MAMMAPLASTY Bilateral    BREAST BIOPSY Right 2014   fibroadenoma   BREAST SURGERY  18 years ago   augmentation.   IR IMAGING GUIDED PORT INSERTION  09/06/2020   LAPAROTOMY  08/09/2020   Procedure: EXPLORATORY LAPAROTOMY, MASS EXCISION, PERITONEAL BIOPSIES;  Surgeon: [name], MD;  Location: [facility];  Service: Gynecology;;   OMENTECTOMY N/A 08/09/2020   Procedure: OMENTECTOMY;  Surgeon: [name], MD;  Location: [facility];  Service: Gynecology;  Laterality: N/A;   SALPINGOOPHORECTOMY Right 08/09/2020   Procedure: OPEN RIGHT SALPINGO OOPHORECTOMY;  Surgeon: [name], MD;  Location: [facility];  Service: Gynecology;  Laterality: Right;   VAGINAL HYSTERECTOMY  2015  ° ° °I have reviewed the social history and family history with the patient and they are unchanged from previous note. ° °ALLERGIES:  is allergic to codeine and wellbutrin [bupropion]. ° °MEDICATIONS:  °Current Outpatient Medications  °Medication Sig Dispense Refill  ° acetaminophen (TYLENOL) 325 MG tablet Take by mouth every 6 (six) hours as needed for mild pain.    ° albuterol (VENTOLIN HFA) 108 (90 Base) MCG/ACT inhaler SMARTSIG:2 Inhalation By Mouth Every 4-6 Hours PRN 1 each  1  ° ALPRAZolam (XANAX) 0.25 MG tablet Take 1 tablet (0.25 mg total) by mouth 2 (two) times daily as needed for anxiety. 20 tablet 0  ° capecitabine (XELODA) 500 MG tablet TAKE 2 TABLETS BY MOUTH IN THE  MORNING AND 3 TABLETS BY MOUTH  IN THE EVENING AFTER MEALS FOR  14 DAYS ON THEN 7 DAYS OFF AND  REPEAT EVERY 21 DAYS 70 tablet 0  ° cholecalciferol (VITAMIN D3) 25 MCG (1000 UNIT) tablet Take 1,000 Units by mouth daily.    ° diphenhydrAMINE (BENADRYL) 25 mg capsule Take 25 mg by mouth at bedtime as needed for allergies.    ° gabapentin (NEURONTIN) 100 MG capsule Take 2 capsules (200 mg total) by mouth 2 (two) times daily. 120 capsule 1  ° ibuprofen (ADVIL) 800 MG tablet Take 1 tablet (800 mg total) by mouth every 8 (eight) hours as needed for moderate pain. For AFTER surgery only 30 tablet 0  ° Ibuprofen-diphenhydrAMINE HCl (IBUPROFEN PM) 200-25 MG CAPS Take 1 tablet by mouth at bedtime as needed (sleep).    ° ipratropium (ATROVENT) 0.03 % nasal spray Place 2 sprays into both nostrils every 12 (twelve) hours. 30 mL 0  ° Multiple Vitamin (MULTIVITAMIN) tablet Take 1 tablet by mouth daily.    ° oxyCODONE (OXY IR/ROXICODONE) 5 MG immediate release tablet Take 1 tablet (5 mg total) by mouth every 4 (four) hours as needed for severe pain. For severe pain only, do not take and drive 20 tablet 0  ° predniSONE (DELTASONE) 20 MG tablet Take 2 tablets (40 mg total) by mouth daily with breakfast. 6 tablet 0  ° Psyllium (METAMUCIL PO) Take 1 Dose by mouth daily.    ° senna-docusate (SENOKOT-S) 8.6-50 MG tablet Take 2 tablets by mouth at bedtime. Do not take if having diarrhea (Patient taking differently: Take 2 tablets by mouth daily. Do not take if having diarrhea) 60 tablet 0  ° Tetrahydrozoline HCl (VISINE OP) Place 1 drop into both eyes daily as needed (itching /redness).    ° urea (CARMOL) 10 % cream Apply topically 2 (two) times daily. To palms and bottom of feet 71 g 0  ° vitamin B-12 (CYANOCOBALAMIN) 1000 MCG tablet  Take 1,000 mcg by mouth daily.    ° vitamin C (ASCORBIC ACID) 500 MG tablet Take 500 mg by mouth daily.    ° vitamin E 180 MG (400 UNITS) capsule Take 400 Units by mouth daily.    ° °No current facility-administered medications for this visit.  ° °Facility-Administered Medications Ordered in Other Visits  °Medication Dose Route Frequency Provider Last Rate Last Admin  ° heparin lock flush 100 unit/mL  500 Units Intracatheter Once PRN Feng, Yan, MD      ° PACLitaxel (TAXOL) 132 mg in sodium chloride 0.9 % 250 mL chemo infusion (</= 80mg/m2)  80 mg/m2 (Treatment Plan Recorded) Intravenous Once Feng, Yan, MD      ° ramucirumab (CYRAMZA) 500 mg in sodium chloride 0.9 % 200 mL   chemo infusion  8 mg/kg (Treatment Plan Recorded) Intravenous Once [name], MD       sodium chloride flush (NS) 0.9 % injection 10 mL  10 mL Intracatheter PRN [name], MD        PHYSICAL EXAMINATION: ECOG PERFORMANCE STATUS: 2 - Symptomatic, <50% confined to bed  There were no vitals filed for this visit. Wt Readings from Last 3 Encounters:  06/14/21 124 lb 12.8 oz (56.6 kg)  06/07/21 126 lb 8 oz (57.4 kg)  05/30/21 126 lb 8 oz (57.4 kg)     GENERAL:alert, no distress and comfortable SKIN: skin color, texture, turgor are normal, no rashes or significant lesions EYES: normal, Conjunctiva are pink and non-injected, sclera clear LUNGS: clear to auscultation and percussion with normal breathing effort, (+) mildly decreased breath sounds on the right HEART: regular rate & rhythm and no murmurs and no lower extremity edema NEURO: alert & oriented x 3 with fluent speech, no focal motor/sensory deficits  LABORATORY DATA:  I have reviewed the data as listed CBC Latest Ref Rng & Units 06/14/2021 06/07/2021 05/26/2021  WBC 4.0 - 10.5 K/uL 5.5 6.7 7.9  Hemoglobin 12.0 - 15.0 g/dL 12.1 12.3 12.2  Hematocrit 36.0 - 46.0 % 36.7 36.7 36.6  Platelets 150 - 400 K/uL 413(H) 332 459(H)     CMP Latest Ref Rng & Units 06/14/2021 06/07/2021  05/26/2021  Glucose 70 - 99 mg/dL 116(H) 126(H) 82  BUN 6 - 20 mg/dL _0 Creatinine 0.44 - 1.00 mg/dL 0.46 0.51 0.64  Sodium 135 - 145 mmol/L 138 135 140  Potassium 3.5 - 5.1 mmol/L 4.2 3.9 4.3  Chloride 98 - 111 mmol/L 103 103 105  CO2 22 - 32 mmol/L _1 Calcium 8.9 - 10.3 mg/dL 8.9 8.3(L) 8.9  Total Protein 6.5 - 8.1 g/dL 7.2 7.2 7.2  Total Bilirubin 0.3 - 1.2 mg/dL 0.3 0.3 0.4  Alkaline Phos 38 - 126 U/L 126 123 132(H)  AST 15 - 41 U/L 15 20 14(L)  ALT 0 - 44 U/L _2 RADIOGRAPHIC STUDIES: I have personally reviewed the radiological images as listed and agreed with the findings in the report. No results found.    No orders of the defined types were placed in this encounter.  All questions were answered. The patient knows to call the clinic with any problems, questions or concerns. No barriers to learning was detected. The total time spent in the appointment was 30 minutes.     [name], MD 06/14/2021   I, Wilburn Mylar, am acting as scribe for [name], MD.   I have reviewed the above documentation for accuracy and completeness, and I agree with the above.

## 2021-06-15 ENCOUNTER — Other Ambulatory Visit: Payer: Self-pay | Admitting: Oncology

## 2021-06-16 ENCOUNTER — Other Ambulatory Visit: Payer: Self-pay | Admitting: Hematology

## 2021-06-16 MED ORDER — DEXAMETHASONE 4 MG PO TABS: 4.0000 mg | ORAL_TABLET | Freq: Every day | ORAL | 0 refills | Status: AC

## 2021-06-20 ENCOUNTER — Other Ambulatory Visit (HOSPITAL_COMMUNITY): Payer: Self-pay

## 2021-06-25 NOTE — Progress Notes (Signed)
Sheryl Pratt OFFICE PROGRESS NOTE  Lorrene Reid, PA-C [place] [address]  DIAGNOSIS:  f/u of metastatic gastric adenocarcinoma  Oncology History Overview Note  Cancer Staging Gastric adenocarcinoma [facility]) Staging form: Stomach, AJCC 8th Edition - Clinical stage from 08/26/2020: Stage IVB (cTX, cN0, pM1) - Signed by [name], MD on 09/06/2020 Stage prefix: Initial diagnosis Total positive nodes: 0 Histologic grade (G): G3 Histologic grading system: 3 grade system    Gastric adenocarcinoma ([place])  07/30/2020 Imaging   CT Abdomen and Pelvis with Contrast  IMPRESSION: 1. Large hypervascular soft tissue mass occupies the pelvis, compressing the urinary bladder and GI structures. No definite connection of this mass is seen to the vaginal cuff. The ovaries are not identified. 2. Enlarged left adrenal gland. Given the presence of soft tissue mass in the pelvis, this may represent metastatic disease to the adrenal gland. 3. Further evaluation with MRI of the abdomen and pelvis may be considered. Surgical consultation is also advised. 4. Right lower pole nonobstructive nephrolithiasis. 5. Aortic atherosclerosis.     08/09/2020 Procedure   Exploratory laparotomy with right salpingo-oophorectomy, omentectomy washings, peritoneal biopsies, and omentectomy under the care of Dr. Berline Lopes    08/09/2020 Pathology Results   FINAL MICROSCOPIC DIAGNOSIS:   A. ADNEXAL MASS, RIGHT, SALPINGO OOPHORECTOMY:  - Adenocarcinoma with signet ring cell features.  - No ovarian surface involvement identified.  - Benign Fallopian tube.  - See comment.   The right adnexal mass is 17.8 cm in greatest dimension and is diffusely involved by adenocarcinoma with diffuse signet ring cell features. Immunohistochemistry shows positivity with cytokeratin 7 and cytokeratin 20.  The tumor is negative with estrogen receptor, GATA3, GCDFP, CD56, chromogranin, synaptic ficin,  calretinin, inhibin, Napsin A, TTF-1, WT1 and PAX 8.  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  Possible primary sites  include upper gastrointestinal tract and pancreas.   08/19/2020 Initial Diagnosis   Adenocarcinoma ([place])   08/26/2020 Procedure   Upper Endoscopy by Dr Ardis Hughs  IMPRESSION - There were no overtly neoplastic findings. - There was mild, non-specific gastritis and bulbar duodenitis. Both sites were biopsied given large pelvic mass pathology.   08/26/2020 Pathology Results   Surgical [P], gastric antrum and gastric body bx - POORLY DIFFERENTIATED ADENOCARCINOMA WITH SIGNET RING FEATURES. - SEE MICROSCOPIC DESCRIPTION.    08/26/2020 Cancer Staging   Staging form: Stomach, AJCC 8th Edition - Clinical stage from 08/26/2020: Stage IVB (cTX, cN0, pM1) - Signed by [name], MD on 09/06/2020 Stage prefix: Initial diagnosis Total positive nodes: 0 Histologic grade (G): G3 Histologic grading system: 3 grade system    08/29/2020 PET scan   IMPRESSION: 1. Dominant finding is multifocal hypermetabolic skeletal metastasis. Lesions are subtly evident on CT with subtle sclerosis. Lesions include the pelvis, spine and ribs. 2. Hypermetabolic metastasis in the RIGHT femoral neck with sclerosis may be at risk for pathologic fracture. 3. Resection of large pelvic mass. No residual carcinoma evident within the pelvis. 4. Benign LEFT adrenal adenoma. 5. Diffuse mild metabolic activity in the gastric fundus and proximal body is nonspecific. Recommend correlation with recent upper endoscopy.     09/08/2020 -  Chemotherapy   First-line FOLFOX q2weeks starting 09/08/20. Held 09/30/20 to proceed with palliative radiation. Restarted 11/03/20    09/26/2020 Genetic Testing   Negative hereditary cancer genetic testing: no pathogenic variants detected in [facility] CustomNext-Cancer +RNAinsight Panel.  Variants of uncertain significance detected in  LZTR1 at  p.K452E (c.1354A>G) and MLH1 at p.P747S (c.2239C>T).  The report date is September 21, 2020.   The CustomNext-Cancer +RNAinsight Panel offered by [facility] and includes sequencing and rearrangement analysis for the following 91 genes: AIP, ALK, APC, ATM, AXIN2, BAP1, BARD1, BLM, BMPR1A, BRCA1, BRCA2, BRIP1, CDC73, CDH1, CDK4, CDKN1B, CDKN2A, CHEK2, CTNNA1, DICER1, FANCC, FH, FLCN, GALNT12, KIF1B, LZTR1, MAX, MEN1, MET, MLH1, MRE11A, MSH2, MSH3, MSH6, MUTYH, NBN, NF1, NF2, NTHL1, PALB2, PHOX2B, PMS2, POT1, PRKAR1A, PTCH1, PTEN, RAD50, RAD51C, RAD51D, RB1, RECQL, RET, SDHA, SDHAF2, SDHB, SDHC, SDHD, SMAD4, SMARCA4, SMARCB1, SMARCE1, STK11, SUFU, TMEM127, TP53, TSC1, TSC2, VHL and XRCC2 (sequencing and deletion/duplication); CASR, CFTR, CPA1, CTRC, EGFR, EGLN1, FAM175A, HOXB13, KIT, MITF, MLH3, PALLD, PDGFRA, POLD1, POLE, PRSS1, RINT1, RPS20, SPINK1 and TERT (sequencing only); EPCAM and GREM1 (deletion/duplication only). RNA data is routinely analyzed for use in variant interpretation for all genes.   10/04/2020 - 10/26/2020 Radiation Therapy   Palliative Radiation to right hip/lumbar spine with Dr Lisbeth Renshaw   Site Technique Total Dose (Gy) Dose per Fx (Gy) Completed Fx Beam Energies  Femur Right: Ext_Rt Complex 37.5/37.5 2.5 15/15 15X  Pelvis: Pelvis_Left SI 3D 37.5/37.5 2.5 15/15 10X, 15X    12/28/2020 Imaging   CT CAP  IMPRESSION: 1. Extensive and diffuse sclerotic metastatic bone disease likely reflecting healing changes (lesions were largely lytic on the prior PET-CT). No pathologic fractures or spinal canal compromise. 2. No findings for abdominal/pelvic metastatic disease. 3. Numerous bilateral subsolid/ground-glass pulmonary nodules will require attention on future scans. 4. Stable left adrenal gland adenoma.   Aortic Atherosclerosis (ICD10-I70.0) and Emphysema (ICD10-J43.9).   12/28/2020 Imaging   Bone Scan  IMPRESSION: Multifocal radiotracer accumulation in the axial and  appendicular skeleton consistent with metastatic disease. Relatively diffuse uptake is identified in the right femoral neck, compatible with the sclerotic lesion seen on the 12/28/2020 CT exam.   05/19/2021 Imaging   CT SCAN OF THE CHEST WITH AND WITHOUT IV CONTRAST, CTA OF THE PULMONARY ARTERIES:   IMPRESSION:   3-D RECONSTRUCTIONS DEMONSTRATE NO EVIDENCE OF PULMONARY EMBOLUS.   SMALL TO MODERATE LEFT PLEURAL EFFUSION AND BIBASILAR ATELECTASIS/CONSOLIDATION.   METASTATIC DISEASE TO THE THORACIC SPINE AND RIBS.   11 MM LINGULAR NODULE ON IMAGE 88.    05/24/2021 Imaging   EXAM: CT ABDOMEN AND PELVIS WITH CONTRAST  IMPRESSION: 1. Interval increase in size and number of extensive sclerotic osseous metastatic lesions throughout the included skeleton, consistent with post treatment sclerosis. 2. There are new moderate left, small right pleural effusions and associated atelectasis or consolidation. Diffuse interlobular septal thickening and perhaps fine nodularity throughout the included bilateral lung bases. Findings are highly concerning for pleural and lymphangitic metastatic disease. 3. Nonobstructive right nephrolithiasis.   Aortic Atherosclerosis (ICD10-I70.0).   05/24/2021 Imaging   EXAM: NUCLEAR MEDICINE WHOLE BODY BONE SCAN  IMPRESSION: 1. Progressive bony metastatic disease, with new and enlarging areas of radiotracer uptake throughout the spine, thoracic cage, and pelvis as above.   05/26/2021 Pathology Results   A. PLEURAL FLUID, LEFT, THORACENTESIS:    FINAL MICROSCOPIC DIAGNOSIS:  - Malignant cells consistent with adenocarcinoma  The adenocarcinoma is immunophenotypically consistent with a gastric primary. although the immunoprofile is not specific and may also be seen with pancreatic, biliary and small intestinal tumors.  The. tumor has the following immunophenotype:  CK7, positive; CK20, focally positive; CDX-2, positive; MOC-31, positive; TTF-1, negative; GATA-3,  negative; PAX8, negative; calretinin, negative.  The immunohistochemical (IHC) stains have acceptable controls.    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 PLEURAL FLUID, LEFT, THORACENTESIS:    FINAL MICROSCOPIC DIAGNOSIS:  - Malignant cells consistent with adenocarcinoma  The adenocarcinoma is immunophenotypically consistent with a gastric primary. although the immunoprofile is not specific and may also be seen with pancreatic, biliary and small intestinal tumors.  The. tumor has the following immunophenotype:  CK7, positive; CK20, focally positive; CDX-2, positive; MOC-31, positive; TTF-1, negative; GATA-3, negative; PAX8, negative; calretinin, negative.  The immunohistochemical (IHC) stains have acceptable controls.    05/30/2021 -  Chemotherapy   Patient is on Treatment Plan : GASTROESOPHAGEAL Ramucirumab D1, 15  / PACLitaxel D1,8,15 q28d     06/12/2021 - 06/12/2021 Chemotherapy   Patient is on Treatment Plan : COLORECTAL Nivolumab q14d       CURRENT THERAPY: Chemotherapy with cyramaza on days 1 and 15 and paclitaxel on days 1, 8, and 15 every 4 weeks. Status post 1 cycle.  She is here today for day 1 cycle 2.  INTERVAL HISTORY: Sheryl Pratt 56 y.o. female returns to the clinic today for a follow-up visit.  In the interval since last being seen, the patient underwent a left-sided thoracentesis yesterday. They yielded 375 cc's of fluid.  Her postprocedure chest x-ray did not show any pneumothorax or complications from this procedure.  She has had shortness of breath for several weeks; however, since her procedure yesterday, she has had significant shortness of breath and pain in between her shoulder blades and in her neck.  Because of this, she was having a  challenging time sleeping last night.  She took her prescription pain medication with oxycodone.  She denies any fever or chills.  She denies any lower extremity swelling.  She is only able to walk a few steps without getting short of breath.    She is here today for day 1 cycle 2.  Her most recent chemotherapy was on 06/14/2021. her other complaints are related to increased urination without dysuria and increased constipation the last 2 to 3 days. She is here today for evaluation and repeat blood work before undergoing her next cycle of treatment.        MEDICAL HISTORY: Past Medical History:  Diagnosis Date   Allergy    Arthritis    Bloating    Breast mass, right    Cancer ([facility])    metastaic to bones and abd, May 2022   Constipation    Family history of gastric cancer 09/08/2020   Family history of ovarian cancer 09/08/2020   Family history of pancreatic cancer 09/08/2020   Family history of skin cancer 09/08/2020   Migraine    Pelvic mass    PONV (postoperative nausea and vomiting)    Seasonal allergies    Vitamin D deficiency     ALLERGIES:  is allergic to codeine and wellbutrin [bupropion].  MEDICATIONS:  Current Outpatient Medications  Medication Sig Dispense Refill   acetaminophen (TYLENOL) 325 MG tablet Take by mouth every 6 (six) hours as needed for mild pain.     albuterol (VENTOLIN HFA) 108 (90 Base) MCG/ACT inhaler SMARTSIG:2 Inhalation By Mouth Every 4-6 Hours PRN 1 each 1   ALPRAZolam (XANAX) 0.25 MG tablet Take 1 tablet (0.25 mg total) by mouth 2 (two) times daily as needed  for anxiety. 20 tablet 0   capecitabine (XELODA) 500 MG tablet TAKE 2 TABLETS BY MOUTH IN THE  MORNING AND 3 TABLETS BY MOUTH  IN THE EVENING AFTER MEALS FOR  14 DAYS ON THEN 7 DAYS OFF AND  REPEAT EVERY 21 DAYS 70 tablet 0   cholecalciferol (VITAMIN D3) 25 MCG (1000 UNIT) tablet Take 1,000 Units by mouth daily.     dexamethasone (DECADRON) 4 MG tablet Take 1 tablet (4 mg total) by mouth daily.  Take 3-5 days after chemo for nausea and low appetite 20 tablet 0   diphenhydrAMINE (BENADRYL) 25 mg capsule Take 25 mg by mouth at bedtime as needed for allergies.     gabapentin (NEURONTIN) 100 MG capsule Take 2 capsules (200 mg total) by mouth 2 (two) times daily. 120 capsule 1   ibuprofen (ADVIL) 800 MG tablet Take 1 tablet (800 mg total) by mouth every 8 (eight) hours as needed for moderate pain. For AFTER surgery only 30 tablet 0   Ibuprofen-diphenhydrAMINE HCl (IBUPROFEN PM) 200-25 MG CAPS Take 1 tablet by mouth at bedtime as needed (sleep).     ipratropium (ATROVENT) 0.03 % nasal spray Place 2 sprays into both nostrils every 12 (twelve) hours. 30 mL 0   Multiple Vitamin (MULTIVITAMIN) tablet Take 1 tablet by mouth daily.     oxyCODONE (OXY IR/ROXICODONE) 5 MG immediate release tablet Take 1 tablet (5 mg total) by mouth every 4 (four) hours as needed for severe pain. For severe pain only, do not take and drive 20 tablet 0   predniSONE (DELTASONE) 20 MG tablet Take 2 tablets (40 mg total) by mouth daily with breakfast. 6 tablet 0   Psyllium (METAMUCIL PO) Take 1 Dose by mouth daily.     senna-docusate (SENOKOT-S) 8.6-50 MG tablet Take 2 tablets by mouth at bedtime. Do not take if having diarrhea (Patient taking differently: Take 2 tablets by mouth daily. Do not take if having diarrhea) 60 tablet 0   Tetrahydrozoline HCl (VISINE OP) Place 1 drop into both eyes daily as needed (itching Daralene Milch).     urea (CARMOL) 10 % cream Apply topically 2 (two) times daily. To palms and bottom of feet 71 g 0   vitamin B-12 (CYANOCOBALAMIN) 1000 MCG tablet Take 1,000 mcg by mouth daily.     vitamin C (ASCORBIC ACID) 500 MG tablet Take 500 mg by mouth daily.     vitamin E 180 MG (400 UNITS) capsule Take 400 Units by mouth daily.     No current facility-administered medications for this visit.    SURGICAL HISTORY:  Past Surgical History:  Procedure Laterality Date   APPENDECTOMY     AUGMENTATION  MAMMAPLASTY Bilateral    BREAST BIOPSY Right 2014   fibroadenoma   BREAST SURGERY  18 years ago   augmentation.   IR IMAGING GUIDED PORT INSERTION  09/06/2020   LAPAROTOMY  08/09/2020   Procedure: EXPLORATORY LAPAROTOMY, MASS EXCISION, PERITONEAL BIOPSIES;  Surgeon: [name], MD;  Location: [facility];  Service: Gynecology;;   OMENTECTOMY N/A 08/09/2020   Procedure: OMENTECTOMY;  Surgeon: [name], MD;  Location: [facility];  Service: Gynecology;  Laterality: N/A;   SALPINGOOPHORECTOMY Right 08/09/2020   Procedure: OPEN RIGHT SALPINGO OOPHORECTOMY;  Surgeon: [name], MD;  Location: [facility];  Service: Gynecology;  Laterality: Right;   VAGINAL HYSTERECTOMY  2015    REVIEW OF SYSTEMS:   Review of Systems  Constitutional: Positive for fatigue.  Negative for appetite change, chills,  fever and unexpected weight change.  HENT: Negative for mouth sores, nosebleeds, sore throat and trouble swallowing.   Eyes: Negative for eye problems and icterus.  Respiratory: Positive for shortness of breath.  Negative for cough, hemoptysis, and wheezing.   Cardiovascular: Negative for chest pain and leg swelling.  Gastrointestinal: Positive for constipation.  Negative for abdominal pain, diarrhea, nausea and vomiting.  Genitourinary: Positive for frequent urination.  Negative for bladder incontinence, difficulty urinating, dysuria, frequency and hematuria.   Musculoskeletal: Positive for pain between the shoulder blades bilaterally.  Negative for back pain, gait problem, neck pain and neck stiffness.  Skin: Negative for itching and rash.  Neurological: Negative for dizziness, extremity weakness, gait problem, headaches, light-headedness and seizures.  Hematological: Negative for adenopathy. Does not bruise/bleed easily.  Psychiatric/Behavioral: Negative for confusion, depression and sleep disturbance. The patient is not nervous/anxious.     PHYSICAL EXAMINATION:  Blood pressure 117/82,  pulse (!) 102, temperature 97.6 F (36.4 C), temperature source Oral, resp. rate 17, weight 128 lb (58.1 kg), last menstrual period 07/02/2012, SpO2 96 %.  ECOG PERFORMANCE STATUS: 2-3  Physical Exam  Constitutional: Oriented to person, place, and time and chronically ill-appearing female.  HENT:  Head: Normocephalic and atraumatic.  Mouth/Throat: Oropharynx is clear and moist. No oropharyngeal exudate.  Eyes: Conjunctivae are normal. Right eye exhibits no discharge. Left eye exhibits no discharge. No scleral icterus.  Neck: Normal range of motion. Neck supple.  Cardiovascular: Normal rate, regular rhythm, normal heart sounds and intact distal pulses.   Pulmonary/Chest: Effort normal and breath sounds normal.  Crackles in the lower lung field bilaterally.  Appears uncomfortable breathing when speaking.  Tachypneic.  Abdominal: Soft. Bowel sounds are normal. Exhibits no distension and no mass. There is no tenderness.  Musculoskeletal: Normal range of motion. Exhibits no edema.  Lymphadenopathy:    No cervical adenopathy.  Neurological: Alert and oriented to person, place, and time. Exhibits also wasting.  Examined in the wheelchair. Skin: Skin is warm and dry. No rash noted. Not diaphoretic. No erythema. No pallor.  Psychiatric: Mood, memory and judgment normal.  Vitals reviewed.  LABORATORY DATA: Lab Results  Component Value Date   WBC 9.7 06/28/2021   HGB 13.0 06/28/2021   HCT 39.1 06/28/2021   MCV 99.5 06/28/2021   PLT 325 06/28/2021      Chemistry      Component Value Date/Time   NA 137 06/28/2021 0746   NA 141 04/07/2021 0914   K 4.0 06/28/2021 0746   CL 101 06/28/2021 0746   CO2 27 06/28/2021 0746   BUN 13 06/28/2021 0746   BUN 10 04/07/2021 0914   CREATININE 0.53 06/28/2021 0746   CREATININE 0.64 05/26/2021 0836      Component Value Date/Time   CALCIUM 8.8 (L) 06/28/2021 0746   ALKPHOS 128 (H) 06/28/2021 0746   AST 17 06/28/2021 0746   AST 14 (L) 05/26/2021 0836    ALT 15 06/28/2021 0746   ALT 10 05/26/2021 0836   BILITOT 0.4 06/28/2021 0746   BILITOT 0.4 05/26/2021 0836       RADIOGRAPHIC STUDIES:  DG Chest 1 View  Result Date: 06/05/2021 CLINICAL DATA:  Post LEFT thoracentesis EXAM: CHEST  1 VIEW COMPARISON:  05/26/2021 FINDINGS: RIGHT jugular Port-A-Cath with tip projecting over SVC. Normal heart size, mediastinal contours, and pulmonary vascularity. Persistent bronchitic and interstitial changes. Subsegmental atelectasis at LEFT base. No pleural effusion or pneumothorax. Bones demineralized. IMPRESSION: Persistent bronchitic and interstitial changes. No pneumothorax following LEFT thoracentesis.  Electronically Signed   By: Lavonia Dana M.D.   On: 06/05/2021 15:05   US Thoracentesis Asp Pleural space w/IMG guide  Result Date: 06/27/2021 INDICATION: Patient with history of metastatic gastric adenocarcinoma with recurrent malignant left pleural effusion and dyspnea. Request received for therapeutic thoracentesis. No fluid noted on right side during ultrasound exam. EXAM: ULTRASOUND GUIDED THERAPEUTIC LEFT THORACENTESIS MEDICATIONS: 10 mL 1 % lidocaine COMPLICATIONS: None immediate. PROCEDURE: An ultrasound guided thoracentesis was thoroughly discussed with the patient and questions answered. The benefits, risks, alternatives and complications were also discussed. The patient understands and wishes to proceed with the procedure. Written consent was obtained. Ultrasound was performed to localize and mark an adequate pocket of fluid in the left chest. The area was then prepped and draped in the normal sterile fashion. 1% Lidocaine was used for local anesthesia. Under ultrasound guidance a 6 Fr Safe-T-Centesis catheter was introduced. Thoracentesis was performed. The catheter was removed and a dressing applied. FINDINGS: A total of approximately 375 cc of cloudy, dark yellow fluid was removed. IMPRESSION: Successful ultrasound guided left thoracentesis yielding  375 cc of pleural fluid. Read by: Narda Rutherford, AGNP-BC Electronically Signed   By: Albin Felling M.D.   On: 06/27/2021 10:43   US Thoracentesis Asp Pleural space w/IMG guide  Result Date: 06/05/2021 INDICATION: Patient with history of metastatic gastric adenocarcinoma with recurrent malignant left pleural effusion, dyspnea. Request received for therapeutic left thoracentesis. EXAM: ULTRASOUND GUIDED THERAPEUTIC LEFT THORACENTESIS MEDICATIONS: 10 mL 1% lidocaine COMPLICATIONS: None immediate. PROCEDURE: An ultrasound guided thoracentesis was thoroughly discussed with the patient and questions answered. The benefits, risks, alternatives and complications were also discussed. The patient understands and wishes to proceed with the procedure. Written consent was obtained. Ultrasound was performed to localize and mark an adequate pocket of fluid in the left chest. The area was then prepped and draped in the normal sterile fashion. 1% Lidocaine was used for local anesthesia. Under ultrasound guidance a 6 Fr Safe-T-Centesis catheter was introduced. Thoracentesis was performed. The catheter was removed and a dressing applied. FINDINGS: A total of approximately 600 cc of hazy, amber fluid was removed. IMPRESSION: Successful ultrasound guided therapeutic left thoracentesis yielding 600 cc of pleural fluid. Read by: Rowe Robert, PA-C Electronically Signed   By: Ruthann Cancer M.D.   On: 06/05/2021 15:06     ASSESSMENT/PLAN:  JOYIA RIEHLE is a 56 y.o. female with    1. 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 Enlarged left adrenal gland.    -On 08/09/20, she underwent diagnostic laparoscopy with right salpingo-oophorectomy to assure the origin of the mass. The large ovarian mass showed adenocarcinoma  with signet ring cell features, IHC studies show liver upper GI primary. Biopsy of other pelvic tissue was negative for metastasis. -She underwent EGD with Dr. Ardis Hughs on 08/26/20. Final pathology showed poorly differentiated adenofibroma with signet rings features. This is the same as her ovarian pathology.  --FO results showed MSI stable disease with low mutation rate, no targetable mutation. HER2(-). Her PD-L1 was negative. -Dr. Burr Medico started her on first-line chemo with FOLFOX every 2 weeks on 09/08/20. This was held while she was receiving palliative radiation. Dose reduced due to neuropathy and side effects. -she was switched to maintenance Xeloda on 02/09/21, dose decreased due to skin toxicity. -restaging CT/bone scan on 05/24/21 (chest  CT on 05/19/21) showed progression in bones and concern for malignant pleural and lymphangitic metastatic disease.  -She underwent thoracentesis on 2/3 and 2/13. Cytology from the first procedure confirmed metastatic carcinoma. -Dr. Burr Medico changed her treatment to second line chemotherapy paclitaxel, ramucirumab and nivolumab on 05/30/21.  Day 15 of treatment was on 06/14/2021. -The patient was seen with Dr. Burr Medico today (06/28/2021).  She is having worsening shortness of breath today and is having a hard time speaking without becoming shortness of breath.  Her post procedure chest x-ray did not show any pneumothorax applications.  Her oxygen is 96%.  Her oxygen drops to 90% when ambulating a few steps. -Dr. Burr Medico recommends the patient undergo a stat CT scan of the chest to rule out PE or disease progression in the lung.  Dr. Burr Medico recommends that the patient be evaluated in the emergency room due to her shortness of breath and pain. -The patient will be transferred to the emergency room.  Dr. Burr Medico also recommended that if the patient had disease progression in the emergency room, that she be considered for admission for symptom management and discussion on transitioning to comfort  care. -I have tentatively sent a scheduling message to reschedule her appointment and an office visit next week.   2.  Dyspnea -She has developed significant dyspnea on exertion in the past few weeks, related to pleural effusion and lung metastasis.  -When checked, her oxygen level when she walks, it dropped to 90% on room air (06/28/21) -Dr. Burr Medico prescribes her albuterol and order repeat thoracentesis to be done weekly PRN.  -Most recent on 06/27/21 and yielded 375 ccs ever since her breathing yesterday, she has been endorsing significant shortness of breath, chest and back and neck tightness/pain.   -Her oxygen is 96 on room air but drops to 90 when ambulating. -Dr. Burr Medico recommends    3. Neuropathy G1 -she is currently taking B12, and she was prescribed gabapentin on 12/01/20, which has provided relief. -stable and controlled on gabapentin   4. Pulmonary nodules, h/o smoking  -she has a history of smoking, and only smokes an occasional cigarette now and uses nicotine gum. -pulmonary nodules first seen on CT CAP 12/2020, most of which are sub-solid.  -CT chest (PE protocol) on 05/19/21 showed an 11 mm lingular nodule.   5. Lower back and ribcage pain, secondary to bone mets -Improved with palliative radiation therapy to the right hip and left pelvis 09/30/20 through 10/26/20. -She began Zometa on 12/29/20. -on gabapentin and oxycodone for right hip and leg pain.    6. Social Support -She and her husband bought a house in [place], where their children live. They plan to retire there.  -She reports anxiety related to her treatments. Dr. Burr Medico prescribed Xanax on 11/17/20. -she reports her insurance does not cover much in terms of her treatments. She notes she has met with our financial counselor and was told her husband makes too much money for them to qualify for any assistance.   7. Family History of Cancer  -The patient's brother had pancreatic cancer, diagnosed at age 41. She notes unknown  cancer in another family member. -Genetic testing done 09/08/20 was negative, with VUS in MLH1 and LZTR1.     PLAN: -Dr. Burr Medico recommends sending to ER for consideration of stat CTA to rule out disease progression, PE, etc for her severe shortness of breath -If disease progression, Dr. Burr Medico may recommend transition to comfort care    No orders of the defined  types were placed in this encounter.     Cassandra L Heilingoetter, PA-C 06/28/21  Addendum  I have seen the patient, examined her. I agree with the assessment and and plan and have edited the notes.  Sheryl Pratt presents to clinic for chemo today,she had repeated thoracentesis yesterday, however her dyspnea has been gradually getting worse lately.  She was tachypneic at rest in the clinic.  Oxygen in low 90's. I sent her to ED for further evaluation and ruled out PE.  I saw her again in ED in late afternoon, and reviewed her CT scan images and findings with her.  PE was negative, unfortunately she has worsening bilateral pulmonary infiltrates, most consistent with worsening lymphangitic metastasis, pleural effusion was minimal.  I discussed the option of hospital admission for antibiotics and supportive care, versus going home with oral antibiotics, and a transition to hospice.  She is not a candidate for further chemotherapy due to her poor performance status and cancer progression. I discussed the hospice service in detail with her today.  She would like to think about it, and discussed with her family.  We will follow-up with her tomorrow to see if she is ready for hospice enrollment.   I spent a total of 40 mins for her care today.   [name]  06/28/2021

## 2021-06-27 ENCOUNTER — Ambulatory Visit (HOSPITAL_COMMUNITY)
Admission: RE | Admit: 2021-06-27 | Discharge: 2021-06-27 | Disposition: A | Discharge status: Home / Self Care | Payer: 59 | Source: Ambulatory Visit | Attending: Physician Assistant | Admitting: Physician Assistant

## 2021-06-27 ENCOUNTER — Ambulatory Visit (HOSPITAL_COMMUNITY)
Admission: RE | Admit: 2021-06-27 | Discharge: 2021-06-27 | Disposition: A | Discharge status: Home / Self Care | Payer: 59 | Source: Ambulatory Visit | Attending: Hematology | Admitting: Hematology

## 2021-06-27 ENCOUNTER — Other Ambulatory Visit: Payer: Self-pay

## 2021-06-27 ENCOUNTER — Other Ambulatory Visit (HOSPITAL_COMMUNITY): Payer: Self-pay | Admitting: Physician Assistant

## 2021-06-27 DIAGNOSIS — R0600 Dyspnea, unspecified: Secondary | ICD-10-CM | POA: Insufficient documentation

## 2021-06-27 DIAGNOSIS — C786 Secondary malignant neoplasm of retroperitoneum and peritoneum: Secondary | ICD-10-CM | POA: Insufficient documentation

## 2021-06-27 DIAGNOSIS — Z9889 Other specified postprocedural states: Secondary | ICD-10-CM

## 2021-06-27 DIAGNOSIS — J910 Malignant pleural effusion: Secondary | ICD-10-CM | POA: Insufficient documentation

## 2021-06-27 DIAGNOSIS — Z85028 Personal history of other malignant neoplasm of stomach: Secondary | ICD-10-CM | POA: Insufficient documentation

## 2021-06-27 MED ORDER — LIDOCAINE HCL 1 % IJ SOLN
INTRAMUSCULAR | Status: AC
  Administered 2021-06-27: 10 mL
  Filled 2021-06-27: qty 20

## 2021-06-27 MED FILL — Dexamethasone Sodium Phosphate Inj 100 MG/10ML: INTRAMUSCULAR | Qty: 1 | Status: AC

## 2021-06-27 NOTE — Procedures (Addendum)
PROCEDURE SUMMARY: ? ?Successful US guided therapeutic left thoracentesis. ?Yielded 375 cc of cloudy, dark yellow fluid. ?Pt tolerated procedure well. ?No immediate complications. ? ?Specimen not sent for labs. ?CXR ordered. ? ?EBL < 1 mL ? ?Tyson Alias, AGNP ?06/27/2021 ?10:21 AM ? ?   ?

## 2021-06-28 ENCOUNTER — Inpatient Hospital Stay: Payer: 59

## 2021-06-28 ENCOUNTER — Emergency Department (HOSPITAL_COMMUNITY)
Admission: EM | Admit: 2021-06-28 | Discharge: 2021-06-28 | Disposition: A | Discharge status: Home / Self Care | Payer: 59 | Attending: Emergency Medicine | Admitting: Emergency Medicine

## 2021-06-28 ENCOUNTER — Other Ambulatory Visit: Payer: Self-pay

## 2021-06-28 ENCOUNTER — Encounter (HOSPITAL_COMMUNITY): Payer: Self-pay

## 2021-06-28 ENCOUNTER — Inpatient Hospital Stay: Payer: 59 | Admitting: Dietician

## 2021-06-28 ENCOUNTER — Inpatient Hospital Stay: Payer: 59 | Attending: Gynecologic Oncology | Admitting: Physician Assistant

## 2021-06-28 ENCOUNTER — Emergency Department (HOSPITAL_COMMUNITY): Payer: 59

## 2021-06-28 VITALS — BP 117/82 | HR 102 | Temp 97.6°F | Resp 17 | Wt 128.0 lb

## 2021-06-28 DIAGNOSIS — C799 Secondary malignant neoplasm of unspecified site: Secondary | ICD-10-CM

## 2021-06-28 DIAGNOSIS — C169 Malignant neoplasm of stomach, unspecified: Secondary | ICD-10-CM | POA: Diagnosis not present

## 2021-06-28 DIAGNOSIS — R0602 Shortness of breath: Secondary | ICD-10-CM

## 2021-06-28 DIAGNOSIS — J189 Pneumonia, unspecified organism: Secondary | ICD-10-CM

## 2021-06-28 DIAGNOSIS — Z20822 Contact with and (suspected) exposure to covid-19: Secondary | ICD-10-CM | POA: Insufficient documentation

## 2021-06-28 DIAGNOSIS — C786 Secondary malignant neoplasm of retroperitoneum and peritoneum: Secondary | ICD-10-CM

## 2021-06-28 DIAGNOSIS — Z95828 Presence of other vascular implants and grafts: Secondary | ICD-10-CM

## 2021-06-28 LAB — COMPREHENSIVE METABOLIC PANEL
ALT: 15 U/L (ref 0–44)
ALT: 17 U/L (ref 0–44)
AST: 17 U/L (ref 15–41)
AST: 20 U/L (ref 15–41)
Albumin: 3.6 g/dL (ref 3.5–5.0)
Albumin: 3 g/dL — ABNORMAL LOW (ref 3.5–5.0)
Alkaline Phosphatase: 128 U/L — ABNORMAL HIGH (ref 38–126)
Alkaline Phosphatase: 114 U/L (ref 38–126)
Anion gap: 9 (ref 5–15)
Anion gap: 6 (ref 5–15)
BUN: 13 mg/dL (ref 6–20)
BUN: 12 mg/dL (ref 6–20)
CO2: 27 mmol/L (ref 22–32)
CO2: 27 mmol/L (ref 22–32)
Calcium: 8.8 mg/dL — ABNORMAL LOW (ref 8.9–10.3)
Calcium: 8.2 mg/dL — ABNORMAL LOW (ref 8.9–10.3)
Chloride: 101 mmol/L (ref 98–111)
Chloride: 102 mmol/L (ref 98–111)
Creatinine, Ser: 0.53 mg/dL (ref 0.44–1.00)
Creatinine, Ser: 0.4 mg/dL — ABNORMAL LOW (ref 0.44–1.00)
GFR, Estimated: >60 mL/min (ref >60)
GFR, Estimated: >60 mL/min (ref >60)
Glucose, Bld: 165 mg/dL — ABNORMAL HIGH (ref 70–99)
Glucose, Bld: 81 mg/dL (ref 70–99)
Potassium: 4 mmol/L (ref 3.5–5.1)
Potassium: 4 mmol/L (ref 3.5–5.1)
Sodium: 137 mmol/L (ref 135–145)
Sodium: 135 mmol/L (ref 135–145)
Total Bilirubin: 0.4 mg/dL (ref 0.3–1.2)
Total Bilirubin: 0.3 mg/dL (ref 0.3–1.2)
Total Protein: 7 g/dL (ref 6.5–8.1)
Total Protein: 7 g/dL (ref 6.5–8.1)

## 2021-06-28 LAB — CBC WITH DIFFERENTIAL/PLATELET
Abs Immature Granulocytes: 0.08 10*3/uL — ABNORMAL HIGH (ref 0.00–0.07)
Abs Immature Granulocytes: 0.07 10*3/uL (ref 0.00–0.07)
Basophils Absolute: 0 10*3/uL (ref 0.0–0.1)
Basophils Absolute: 0 10*3/uL (ref 0.0–0.1)
Basophils Relative: 0 %
Basophils Relative: 0 %
Eosinophils Absolute: 0 10*3/uL (ref 0.0–0.5)
Eosinophils Absolute: 0 10*3/uL (ref 0.0–0.5)
Eosinophils Relative: 0 %
Eosinophils Relative: 1 %
HCT: 39.1 % (ref 36.0–46.0)
HCT: 38.9 % (ref 36.0–46.0)
Hemoglobin: 13 g/dL (ref 12.0–15.0)
Hemoglobin: 12.8 g/dL (ref 12.0–15.0)
Immature Granulocytes: 1 %
Immature Granulocytes: 1 %
Lymphocytes Relative: 14 %
Lymphocytes Relative: 13 %
Lymphs Abs: 1.3 10*3/uL (ref 0.7–4.0)
Lymphs Abs: 1.1 10*3/uL (ref 0.7–4.0)
MCH: 33.1 pg (ref 26.0–34.0)
MCH: 33.6 pg (ref 26.0–34.0)
MCHC: 33.2 g/dL (ref 30.0–36.0)
MCHC: 32.9 g/dL (ref 30.0–36.0)
MCV: 99.5 fL (ref 80.0–100.0)
MCV: 102.1 fL — ABNORMAL HIGH (ref 80.0–100.0)
Monocytes Absolute: 1 10*3/uL (ref 0.1–1.0)
Monocytes Absolute: 1.1 10*3/uL — ABNORMAL HIGH (ref 0.1–1.0)
Monocytes Relative: 10 %
Monocytes Relative: 13 %
Neutro Abs: 7.3 10*3/uL (ref 1.7–7.7)
Neutro Abs: 6.2 10*3/uL (ref 1.7–7.7)
Neutrophils Relative %: 75 %
Neutrophils Relative %: 72 %
Platelets: 325 10*3/uL (ref 150–400)
Platelets: 322 10*3/uL (ref 150–400)
RBC: 3.93 MIL/uL (ref 3.87–5.11)
RBC: 3.81 MIL/uL — ABNORMAL LOW (ref 3.87–5.11)
RDW: 16.3 % — ABNORMAL HIGH (ref 11.5–15.5)
RDW: 16.6 % — ABNORMAL HIGH (ref 11.5–15.5)
WBC: 9.7 10*3/uL (ref 4.0–10.5)
WBC: 8.7 10*3/uL (ref 4.0–10.5)
nRBC: 0 % (ref 0.0–0.2)
nRBC: 0 % (ref 0.0–0.2)

## 2021-06-28 LAB — TOTAL PROTEIN, URINE DIPSTICK: Protein, ur: NEGATIVE mg/dL

## 2021-06-28 LAB — RESP PANEL BY RT-PCR (FLU A&B, COVID) ARPGX2
Influenza A by PCR: NEGATIVE
Influenza B by PCR: NEGATIVE
SARS Coronavirus 2 by RT PCR: NEGATIVE

## 2021-06-28 LAB — CEA (IN HOUSE-CHCC): CEA (CHCC-In House): 2.55 ng/mL (ref 0.00–5.00)

## 2021-06-28 LAB — BLOOD GAS, VENOUS
Acid-Base Excess: 3 mmol/L — ABNORMAL HIGH (ref 0.0–2.0)
Bicarbonate: 27.9 mmol/L (ref 20.0–28.0)
O2 Saturation: 70.9 %
Patient temperature: 37
pCO2, Ven: 43 mmHg — ABNORMAL LOW (ref 44–60)
pH, Ven: 7.42 (ref 7.25–7.43)
pO2, Ven: 42 mmHg (ref 32–45)

## 2021-06-28 MED ORDER — AMOXICILLIN-POT CLAVULANATE 875-125 MG PO TABS: 1.0000 | ORAL_TABLET | Freq: Two times a day (BID) | ORAL | 0 refills | Status: AC

## 2021-06-28 MED ORDER — IOHEXOL 350 MG/ML SOLN
80.0000 mL | Freq: Once | INTRAVENOUS | Status: AC | PRN
  Administered 2021-06-28: 57 mL via INTRAVENOUS

## 2021-06-28 MED ORDER — METHYLPREDNISOLONE SODIUM SUCC 125 MG IJ SOLR
125.0000 mg | Freq: Once | INTRAMUSCULAR | Status: AC
  Administered 2021-06-28: 125 mg via INTRAVENOUS
  Filled 2021-06-28: qty 2

## 2021-06-28 MED ORDER — HEPARIN SOD (PORK) LOCK FLUSH 100 UNIT/ML IV SOLN
500.0000 [IU] | Freq: Once | INTRAVENOUS | Status: AC
  Administered 2021-06-28: 500 [IU]
  Filled 2021-06-28: qty 5

## 2021-06-28 MED ORDER — MAGNESIUM SULFATE 2 GM/50ML IV SOLN
2.0000 g | Freq: Once | INTRAVENOUS | Status: AC
  Administered 2021-06-28: 2 g via INTRAVENOUS
  Filled 2021-06-28: qty 50

## 2021-06-28 MED ORDER — ALBUTEROL SULFATE (2.5 MG/3ML) 0.083% IN NEBU
2.5000 mg | INHALATION_SOLUTION | Freq: Once | RESPIRATORY_TRACT | Status: AC
  Administered 2021-06-28: 2.5 mg via RESPIRATORY_TRACT
  Filled 2021-06-28: qty 3

## 2021-06-28 MED ORDER — AMOXICILLIN-POT CLAVULANATE 875-125 MG PO TABS
1.0000 | ORAL_TABLET | Freq: Once | ORAL | Status: AC
  Administered 2021-06-28: 1 via ORAL
  Filled 2021-06-28: qty 1

## 2021-06-28 MED ORDER — PREDNISONE 10 MG PO TABS: ORAL_TABLET | ORAL | 0 refills | Status: AC

## 2021-06-28 MED ORDER — SODIUM CHLORIDE 0.9% FLUSH
10.0000 mL | Freq: Once | INTRAVENOUS | Status: AC
  Administered 2021-06-28: 10 mL

## 2021-06-28 NOTE — Progress Notes (Signed)
Patient is currently in [facility] Emergency Department. Will reschedule nutrition follow up as able. ?

## 2021-06-28 NOTE — ED Triage Notes (Signed)
Pt coming from cancer center with worsening SOB over the last 3-4 days. Patient reports that she has had SOB over the last three weeks and has received 3 thoracenteses with the most recent one being yesterday. Patient reports that she feels like this last one did not help like the others ones have. Cancer center reports that they want to rule out PE vs. Disease progression. Pt reports that she had labs drawn this am.  ? ?Pt has stage 4 gastric cancer, receiving chemo and last tx was 2 wks ago.  ?

## 2021-06-28 NOTE — ED Provider Notes (Signed)
[facility] DEPT Provider Note   CSN: [id number] Arrival date & time: 06/28/21  0848     History  Chief Complaint  Patient presents with   Shortness of Breath    Sheryl Pratt is a 56 y.o. female.  HPI Patient went to her oncology office today for an infusion to treat cancer when she was found to be short of breath and hypoxic with ambulation.  She was therefore sent here for further evaluation.  She has been short of breath for several weeks.  Shortness of breath worse at nighttime.    Home Medications Prior to Admission medications   Medication Sig Start Date End Date Taking? Authorizing Provider  acetaminophen (TYLENOL) 325 MG tablet Take by mouth every 6 (six) hours as needed for mild pain.    [provider]  albuterol (VENTOLIN HFA) 108 (90 Base) MCG/ACT inhaler SMARTSIG:2 Inhalation By Mouth Every 4-6 Hours PRN 06/07/21   [name], MD  ALPRAZolam Duanne Moron) 0.25 MG tablet Take 1 tablet (0.25 mg total) by mouth 2 (two) times daily as needed for anxiety. 11/17/20   [name], MD  capecitabine (XELODA) 500 MG tablet TAKE 2 TABLETS BY MOUTH IN THE  MORNING AND 3 TABLETS BY MOUTH  IN THE EVENING AFTER MEALS FOR  14 DAYS ON THEN 7 DAYS OFF AND  REPEAT EVERY 21 DAYS 05/18/21   [name], MD  cholecalciferol (VITAMIN D3) 25 MCG (1000 UNIT) tablet Take 1,000 Units by mouth daily.    [provider]  dexamethasone (DECADRON) 4 MG tablet Take 1 tablet (4 mg total) by mouth daily. Take 3-5 days after chemo for nausea and low appetite 06/16/21   [name], MD  diphenhydrAMINE (BENADRYL) 25 mg capsule Take 25 mg by mouth at bedtime as needed for allergies.    [provider]  gabapentin (NEURONTIN) 100 MG capsule Take 2 capsules (200 mg total) by mouth 2 (two) times daily. 03/24/21   [name], MD  ibuprofen (ADVIL) 800 MG tablet Take 1 tablet (800 mg total) by mouth every 8 (eight) hours as needed for moderate pain. For AFTER surgery only  08/02/20   Joylene John D, NP  Ibuprofen-diphenhydrAMINE HCl (IBUPROFEN PM) 200-25 MG CAPS Take 1 tablet by mouth at bedtime as needed (sleep).    [provider]  ipratropium (ATROVENT) 0.03 % nasal spray Place 2 sprays into both nostrils every 12 (twelve) hours. 06/27/20   Lorrene Reid, PA-C  Multiple Vitamin (MULTIVITAMIN) tablet Take 1 tablet by mouth daily.    [provider]  oxyCODONE (OXY IR/ROXICODONE) 5 MG immediate release tablet Take 1 tablet (5 mg total) by mouth every 4 (four) hours as needed for severe pain. For severe pain only, do not take and drive 24/82/50   [name], MD  predniSONE (DELTASONE) 20 MG tablet Take 2 tablets (40 mg total) by mouth daily with breakfast. 04/28/21   Lorrene Reid, PA-C  Psyllium (METAMUCIL PO) Take 1 Dose by mouth daily.    [provider]  senna-docusate (SENOKOT-S) 8.6-50 MG tablet Take 2 tablets by mouth at bedtime. Do not take if having diarrhea Patient taking differently: Take 2 tablets by mouth daily. Do not take if having diarrhea 08/02/20   Joylene John D, NP  Tetrahydrozoline HCl (VISINE OP) Place 1 drop into both eyes daily as needed (itching Daralene Milch).    [provider]  urea (CARMOL) 10 % cream Apply topically 2 (two) times daily. To palms and bottom of  feet 03/15/21   [name], MD  vitamin B-12 (CYANOCOBALAMIN) 1000 MCG tablet Take 1,000 mcg by mouth daily.    [provider]  vitamin C (ASCORBIC ACID) 500 MG tablet Take 500 mg by mouth daily.    [provider]  vitamin E 180 MG (400 UNITS) capsule Take 400 Units by mouth daily.    [provider]  prochlorperazine (COMPAZINE) 10 MG tablet TAKE 1 TABLET BY MOUTH EVERY 6 HOURS AS NEEDED FOR NAUSEA OR VOMITING 04/07/21 05/25/21  [name], MD      Allergies    Codeine and Wellbutrin [bupropion]    Review of Systems   Review of Systems  Physical Exam Updated Vital Signs LMP 07/02/2012    SpO2 96%  Physical  Exam Vitals and nursing note reviewed.  Constitutional:      General: She is not in acute distress.    Appearance: She is well-developed. She is ill-appearing. She is not toxic-appearing or diaphoretic.  HENT:     Head: Normocephalic and atraumatic.     Right Ear: External ear normal.     Left Ear: External ear normal.  Eyes:     Conjunctiva/sclera: Conjunctivae normal.     Pupils: Pupils are equal, round, and reactive to light.  Neck:     Trachea: Phonation normal.  Cardiovascular:     Rate and Rhythm: Normal rate and regular rhythm.     Heart sounds: Normal heart sounds.  Pulmonary:     Effort: Pulmonary effort is normal. No respiratory distress.     Breath sounds: No stridor.     Comments: Decreased air movement bilaterally, few scattered rhonchi no wheezes or rails.  There is no increased work of breathing.  She is tachypneic. Abdominal:     Palpations: Abdomen is soft.     Tenderness: There is no abdominal tenderness.  Musculoskeletal:        General: Normal range of motion.     Cervical back: Normal range of motion and neck supple.  Skin:    General: Skin is warm and dry.  Neurological:     Mental Status: She is alert and oriented to person, place, and time.     Cranial Nerves: No cranial nerve deficit.     Sensory: No sensory deficit.     Motor: No abnormal muscle tone.     Coordination: Coordination normal.  Psychiatric:        Mood and Affect: Mood normal.        Behavior: Behavior normal.        Thought Content: Thought content normal.        Judgment: Judgment normal.    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 Request received  for therapeutic thoracentesis. No fluid noted on right side during ultrasound exam. EXAM: ULTRASOUND GUIDED THERAPEUTIC LEFT THORACENTESIS MEDICATIONS: 10 mL 1 % lidocaine COMPLICATIONS: None immediate. PROCEDURE: An ultrasound guided thoracentesis was thoroughly discussed with the patient and questions answered. The benefits, risks, alternatives and complications were also discussed. The patient understands and wishes to proceed with the procedure. Written consent was obtained. Ultrasound was performed to localize and mark an adequate pocket of fluid in the left chest.  The area was then prepped and draped in the normal sterile fashion. 1% Lidocaine was used for local anesthesia. Under ultrasound guidance a 6 Fr Safe-T-Centesis catheter was introduced. Thoracentesis was performed. The catheter was removed and a dressing applied. FINDINGS: A total of approximately 375 cc of cloudy, dark yellow fluid was removed. IMPRESSION: Successful ultrasound guided left thoracentesis yielding 375 cc of pleural fluid. Read by: Narda Rutherford, AGNP-BC Electronically Signed   By: Albin Felling M.D.   On: 06/27/2021 10:43    Procedures Procedures    Medications Ordered in ED Medications - No data to display  ED Course/ Medical Decision Making/ A&P Clinical Course as of 06/29/21 2300  Wed Jun 28, 2021  1554 Case discussed with her oncologist Dr. Lysle Rubens who will come to the ED to talk to the patient about plan of care to include hospice versus hospitalization. [EW]    Clinical Course User Index [EW] Daleen Bo, MD                           Medical Decision Making Patient presenting with shortness of breath, dyspnea on exertion and oxygen saturation 90% on room air.  She is not on chronic oxygen.  She has a history of asthma for which she uses inhaled medication.  She had thoracentesis yesterday, apparently treating recurrent malignant effusion.  Problems Addressed: SOB (shortness of breath): acute illness or  injury that poses a threat to life or bodily functions    Details: Associated with hypoxia, and dyspnea on exertion  Amount and/or Complexity of Data Reviewed Independent Historian:     Details: She is a cogent historian External Data Reviewed: labs, radiology and notes.    Details: Notes from radiology yesterday thoracentesis yielded 375 cc of pleural fluid.  Note from oncology today indicating patient with worrisome progression of illness despite routine treatment, presenting now with potentially worsening gastric carcinoma malignant to the lung Labs: ordered. Radiology: ordered and independent interpretation performed.    Details: Chest x-ray, CT angiogram, chest to rule out PE-chest x-ray abnormal, CT ordered to delineate abnormality.  No PE present on CT, diffuse groundglass infiltrate, worrisome for metastatic cancer. Discussion of management or test interpretation with external provider(s): Case discussed with oncology who came to the ED to see the patient.  She will for hospice care.  The patient wants to think about that.  Patient will be discharged with oral antibiotics at the request of the oncologist.  Risk Prescription drug management. Decision regarding hospitalization. Risk Details: Patient with metastatic cancer, presenting with shortness of breath, worsening despite recent thoracocentesis.  No evidence for acute sepsis, serious bacterial infection or significant metabolic instability.  No distinct oxygen requirement at this time.  Plan to have hospice discussion, by oncology, while patient is at home.  Dr. Burr Medico stated that she could order oxygen if the patient needed it.  There is no requirement for hospitalization or further interventions from the ED at this time.           Final Clinical Impression(s) / ED Diagnoses Final diagnoses:  None    Rx / DC Orders ED Discharge Orders     None         Daleen Bo, MD 06/29/21 2304

## 2021-06-28 NOTE — ED Notes (Signed)
Coming from Dr Iline Oven office-presented today with SOB-sending to ED for PE R/O ?

## 2021-06-28 NOTE — Discharge Instructions (Signed)
Continue using your usual medications including albuterol.  You can use this inhaler, 2 puffs every 2-3 hours as needed for cough or trouble breathing.  Start the antibiotic and prednisone prescriptions tomorrow morning.  Follow-up with Dr. Burr Medico, as soon as possible for further care and treatment.  Return here if your condition worsens. ?

## 2021-06-29 ENCOUNTER — Other Ambulatory Visit: Payer: Self-pay

## 2021-06-29 ENCOUNTER — Telehealth: Payer: Self-pay

## 2021-06-29 DIAGNOSIS — C801 Malignant (primary) neoplasm, unspecified: Secondary | ICD-10-CM

## 2021-06-29 DIAGNOSIS — C169 Malignant neoplasm of stomach, unspecified: Secondary | ICD-10-CM

## 2021-06-29 DIAGNOSIS — C786 Secondary malignant neoplasm of retroperitoneum and peritoneum: Secondary | ICD-10-CM

## 2021-06-29 DIAGNOSIS — R0602 Shortness of breath: Secondary | ICD-10-CM

## 2021-06-29 DIAGNOSIS — C7961 Secondary malignant neoplasm of right ovary: Secondary | ICD-10-CM

## 2021-06-29 NOTE — Progress Notes (Signed)
Verbal order from Dr. Burr Medico for referral to Palliative Care and Home O2 (1 to 2 L) [place] continuously.  Faxed referral and orders to [facility] [phone]  F [phone]).  Fax confirmation received.  ?

## 2021-06-29 NOTE — Telephone Encounter (Signed)
Spoke with Dr. Burr Medico regarding decision for Hospice.  Per pt's conversation with Dr. Burr Medico on 06/28/2021 pt has disease progression and chemotherapy is no longer working.  Pt is severely SOB.  Pt and family are still deciding on whether the pt will get Hospice here or in [place] where the pt's children live.  Pt is undecided d/t pt's husband still work here in [place].  Relayed pt's decision to Dr. Burr Medico and Dr Burr Medico would like for the pt to at least get Palliative Care while she decides on her decision.  Dr. Burr Medico would like for pt to have O2 1 to 2 L as needed.  Placed order for palliative Care with [facility]iative [facility] - [phone].  Order for oxygen placed at this time. ?

## 2021-06-30 ENCOUNTER — Telehealth: Payer: Self-pay

## 2021-06-30 NOTE — Telephone Encounter (Signed)
Spoke with patient and scheduled a Mychart Palliative Consult for 07/03/21 @ 1 PM.  ? ?Consent obtained; updated Outlook/Netsmart/Team List and Epic.  ? ? ?

## 2021-07-03 ENCOUNTER — Other Ambulatory Visit: Payer: Self-pay

## 2021-07-03 ENCOUNTER — Other Ambulatory Visit: Payer: Self-pay | Admitting: Physician Assistant

## 2021-07-03 ENCOUNTER — Telehealth: Payer: 59 | Admitting: Hospice

## 2021-07-03 DIAGNOSIS — C7800 Secondary malignant neoplasm of unspecified lung: Secondary | ICD-10-CM

## 2021-07-03 DIAGNOSIS — R0609 Other forms of dyspnea: Secondary | ICD-10-CM

## 2021-07-03 DIAGNOSIS — C169 Malignant neoplasm of stomach, unspecified: Secondary | ICD-10-CM

## 2021-07-03 DIAGNOSIS — Z515 Encounter for palliative care: Secondary | ICD-10-CM

## 2021-07-03 NOTE — Progress Notes (Signed)
[place] [facility] Consult Note Telephone: [phone]  Fax: [phone]  PATIENT NAME: Sheryl Pratt [address] [place] [address] [phone] (home)  DOB: 09-Jul-1965 MRN: [id number]  PRIMARY CARE PROVIDER:    Lorrene Reid, PA-C,  [place]. [place] [address] [phone]  REFERRING PROVIDER:   Dr. [name]   RESPONSIBLE PARTY:    Contact Information     Name Relation Home Work Mobile   Siobhan, Zaro [phone]  [phone]       TELEHEALTH VISIT STATEMENT Due to the COVID-19 crisis, this visit was done via telemedicine from my office and it was initiated and consent by this patient and or family.  I connected with patient OR PROXY by a telephone/video  and verified that I am speaking with the correct person. I discussed the limitations of evaluation and management by telemedicine. The patient expressed understanding and agreed to proceed. Palliative Care was asked to follow this patient to address advance care planning, complex medical decision making and goals of care clarification. This is the initial visit.     ASSESSMENT AND / RECOMMENDATIONS:   Advance Care Planning: Our advance care planning conversation included a discussion about:    The value and importance of advance care planning  Difference between Hospice and Palliative care Exploration of goals of care in the event of a sudden injury or illness  Identification and preparation of a healthcare agent  Review and updating or creation of an  advance directive document . Decision not to resuscitate or to de-escalate disease focused treatments due to poor prognosis.  CODE STATUS: Extensive discussion on ramifications and implications of CODE STATUS.  Patient elects DO NOT RESUSCITATE.  NP signed DNR form to put it in the mail for patient as requested.  Goals of Care: Goals include to maximize quality of life and  symptom management  I spent  16 minutes providing this initial consultation. More than 50% of the time in this consultation was spent on counseling patient and coordinating communication. 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  Seen in Ed for SOB 06/28/2021. Currently on Augmentin and Prednisone taper. Not currently on oxygen supplementation. Albuterol breathing treatment is on hand.  Encourage slow deep breathing, spacing activities as tolerated.  Balance of rest and performance activity. 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 Therapeutic thoracentesis is ongoing, last 06/27/21. No more chemo/radiation at this time.  Routine CBC CMP. Pain: Generalized.  Managed with Oxycodone, Tylenol and Gabapentin Constipation: Mix 17 grams of Miralax in 4 to 6 ounces of fluid daily prn constipation. Education on the [employer].  Follow up: Palliative care will continue to follow for complex medical decision making, advance care planning, and clarification of goals. Return 6 weeks or prn. Encouraged to call provider sooner with any concerns.   Family /Caregiver/Community Supports: Patient lives at home with house. Patient is likely to go to [place] to live with her daughter in a week.  She was given [facility] palliative care number to call and inform us when eventually she lives.  HOSPICE ELIGIBILITY/DIAGNOSIS: TBD  Chief Complaint: Initial Palliative care visit  HISTORY OF PRESENT ILLNESS:  Sheryl Pratt is a 56 y.o. year old female  with multiple morbidities requiring close monitoring and with high risk of complications and  mortality: metastatic gastric adenocarcinoma with recurrent malignant left pleural effusion and  dyspnea; metastatic to lung and bone.   History obtained from review of EMR, discussion with primary team, caregiver, family and/or Ms. Owens Shark.  Review and summarization of Epic records shows history from other than patient. Rest of 10 point ROS asked and negative.  I reviewed as needed, available labs, patient records, imaging, studies and related documents from the EMR.  Recent Labs  Lab 06/28/21 0746 06/28/21 1023  WBC 9.7 8.7  HGB 13.0 12.8  HCT 39.1 38.9  PLT 325 322  MCV 99.5 102.1*   Recent Labs  Lab 06/28/21 0746 06/28/21 1023  NA 137 135  K 4.0 4.0  CL 101 102  CO2 27 27  BUN 13 12  CREATININE 0.53 0.40*  GLUCOSE 165* 81   Latest GFR by Cockcroft Gault (not valid in AKI or ESRD) Estimated Creatinine Clearance: 71.2 mL/min (A) (by C-G formula based on SCr of 0.4 mg/dL (L)). Recent Labs  Lab 06/28/21 1023  AST 20  ALT 17  ALKPHOS 114     ROS General: NAD EYES: denies vision changes ENMT: denies dysphagia Cardiovascular: denies chest pain/discomfort Pulmonary: denies cough, endorses shortness of breath on exertion.   Abdomen: endorses good appetite, denies constipation/diarrhea GU: denies dysuria, urinary frequency MSK:  endorses weakness,  no falls reported Skin: denies rashes or wounds Neurological: denies pain, denies insomnia, alert and oriented x4 Psych: Endorses positive mood Heme/lymph/immuno: denies bruises, abnormal bleeding   PAST MEDICAL HISTORY:  Active Ambulatory Problems    Diagnosis Date Noted   Breast microcalcification, mammographic 07/31/2012   Lump or mass in breast 07/31/2012   Breast mass, right    Hx of appendectomy 02/04/2019   Status post hysterectomy with oophorectomy-2015 02/04/2019   Vegan diet 02/04/2019   Smoker- 10 pack yr or less 02/04/2019   Mixed hyperlipidemia 04/14/2019   Hypertriglyceridemia 04/14/2019   Vitamin D deficiency 04/14/2019   Family history of combined hyperlipidemia 04/14/2019   Pelvic mass in female 08/02/2020   Gastric  adenocarcinoma ([place]) 08/19/2020   Family history of pancreatic cancer 09/08/2020   Family history of gastric cancer 09/08/2020   Family history of ovarian cancer 09/08/2020   Family history of skin cancer 09/08/2020   Port-A-Cath in place 09/20/2020   Genetic testing 09/26/2020   Hordeolum externum left lower eyelid 05/25/2020   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[place]) 09/30/2020   Shortness of breath 06/28/2021   Resolved Ambulatory Problems    Diagnosis Date Noted   No Resolved Ambulatory Problems   Past Medical History:  Diagnosis Date   Allergy    Arthritis    Bloating    Cancer (HCC)    Constipation    Migraine    Pelvic mass    PONV (postoperative nausea and vomiting)    Seasonal allergies     SOCIAL HX:  Social History   Tobacco Use   Smoking status: Former    Packs/day: 0.25    Years: 10.00    Pack years: 2.50    Types: Cigarettes    Quit date: 05/2021    Years since quitting: 0.1   Smokeless tobacco: Never   Tobacco comments:    1-2 cigs QD  Substance Use Topics   Alcohol use: Not Currently    Comment: occasional     FAMILY HX:  Family History  Problem Relation Age of Onset   Pancreatic cancer Brother        d. 22   Gastric cancer Paternal Uncle        d.  25   Skin cancer Paternal Grandmother        metastatic; dx unknown age   Pancreatic cancer Other        mother's half brother's son; d. 87s   Skin cancer Other        mother's half brother   Ovarian cancer Cousin        paternal cousin; d. 16s   Uterine cancer Neg Hx    Breast cancer Neg Hx    Colon cancer Neg Hx       ALLERGIES:  Allergies  Allergen Reactions   Codeine Nausea Only   Wellbutrin [Bupropion]     insomnia      PERTINENT MEDICATIONS:  Outpatient Encounter Medications as of 07/03/2021  Medication Sig   acetaminophen (TYLENOL) 325 MG tablet Take by mouth every 6 (six) hours as needed for mild pain.   albuterol (VENTOLIN HFA) 108 (90 Base) MCG/ACT inhaler SMARTSIG:2  Inhalation By Mouth Every 4-6 Hours PRN (Patient taking differently: Inhale 2 puffs into the lungs every 4 (four) hours as needed for wheezing or shortness of breath.)   ALPRAZolam (XANAX) 0.25 MG tablet Take 1 tablet (0.25 mg total) by mouth 2 (two) times daily as needed for anxiety.   amoxicillin-clavulanate (AUGMENTIN) 875-125 MG tablet Take 1 tablet by mouth 2 (two) times daily. One po bid x 7 days   capecitabine (XELODA) 500 MG tablet TAKE 2 TABLETS BY MOUTH IN THE  MORNING AND 3 TABLETS BY MOUTH  IN THE EVENING AFTER MEALS FOR  14 DAYS ON THEN 7 DAYS OFF AND  REPEAT EVERY 21 DAYS   cholecalciferol (VITAMIN D3) 25 MCG (1000 UNIT) tablet Take 1,000 Units by mouth daily.   dexamethasone (DECADRON) 4 MG tablet Take 1 tablet (4 mg total) by mouth daily. Take 3-5 days after chemo for nausea and low appetite   diphenhydrAMINE (BENADRYL) 25 mg capsule Take 25 mg by mouth at bedtime as needed for allergies.   gabapentin (NEURONTIN) 100 MG capsule Take 2 capsules (200 mg total) by mouth 2 (two) times daily.   ibuprofen (ADVIL) 800 MG tablet Take 1 tablet (800 mg total) by mouth every 8 (eight) hours as needed for moderate pain. For AFTER surgery only   Ibuprofen-diphenhydrAMINE HCl (IBUPROFEN PM) 200-25 MG CAPS Take 1 tablet by mouth at bedtime as needed (sleep).   ipratropium (ATROVENT) 0.03 % nasal spray Place 2 sprays into both nostrils every 12 (twelve) hours.   Multiple Vitamin (MULTIVITAMIN) tablet Take 1 tablet by mouth daily.   oxyCODONE (OXY IR/ROXICODONE) 5 MG immediate release tablet Take 1 tablet (5 mg total) by mouth every 4 (four) hours as needed for severe pain. For severe pain only, do not take and drive   predniSONE (DELTASONE) 10 MG tablet Take q day 6,5,4,3,2,1   prochlorperazine (COMPAZINE) 10 MG tablet Take by mouth.   Psyllium (METAMUCIL PO) Take 1 Dose by mouth daily.   senna-docusate (SENOKOT-S) 8.6-50 MG tablet Take 2 tablets by mouth at bedtime. Do not take if having diarrhea  (Patient taking differently: Take 2 tablets by mouth daily. Do not take if having diarrhea)   Tetrahydrozoline HCl (VISINE OP) Place 1 drop into both eyes daily as needed (itching Daralene Milch).   urea (CARMOL) 10 % cream Apply topically 2 (two) times daily. To palms and bottom of feet   vitamin B-12 (CYANOCOBALAMIN) 1000 MCG tablet Take 1,000 mcg by mouth daily.   vitamin C (ASCORBIC ACID) 500 MG tablet Take 500 mg by mouth  daily.   vitamin E 180 MG (400 UNITS) capsule Take 400 Units by mouth daily.   No facility-administered encounter medications on file as of 07/03/2021.     Thank you for the opportunity to participate in the care of Ms. Owens Shark.  The palliative care team will continue to follow. Please call our office at [phone] if we can be of additional assistance.   Note: Portions of this note were generated with [employer]. Dictation errors may occur despite best attempts at proofreading.  Teodoro Spray, NP

## 2021-07-03 NOTE — Addendum Note (Signed)
Addended by: Laverda Sorenson I on: 07/03/2021 04:13 PM ? ? Modules accepted: Level of Service ? ?

## 2021-07-04 ENCOUNTER — Encounter: Payer: Self-pay | Admitting: Hematology

## 2021-07-04 ENCOUNTER — Other Ambulatory Visit: Payer: 59

## 2021-07-04 DIAGNOSIS — Z515 Encounter for palliative care: Secondary | ICD-10-CM

## 2021-07-05 ENCOUNTER — Other Ambulatory Visit: Payer: Self-pay

## 2021-07-05 ENCOUNTER — Other Ambulatory Visit: Payer: 59

## 2021-07-05 ENCOUNTER — Inpatient Hospital Stay: Payer: 59

## 2021-07-05 DIAGNOSIS — Z515 Encounter for palliative care: Secondary | ICD-10-CM

## 2021-07-05 NOTE — Progress Notes (Signed)
PATIENT NAME: Sheryl Pratt ?DOB: [birth year] ?MRN: [id number] ? ?PRIMARY CARE PROVIDER: Lorrene Reid, PA-C ? ?RESPONSIBLE PARTY:  ?Acct ID - Guarantor Home Phone Work Phone Relationship Acct Type  ?[id number] DARIEN, MIGNOGNA* [phone]  Self P/F  ?   [place], [place], [place] [address]  ? ?PLAN OF CARE and INTERVENTION:  ?ADVANCE CARE PLANNING/GOALS OF CARE: Comfort, DNR, Symptom Management ?CAREGIVER EDUCATION: Safety, Symptom Management, Hospice vs Palliative ?DISEASE STATUS: RN Palliative care telephonic encounter completed today with patient's daughter, [place]. Telehealth visit was completed on 07/03/21 with NP and daughter would like to follow-up and request supplemental oxygen. [place] shares that her mom is still having significant shortness of breath and her current medication regime is no longer as effective as it previously has been. She was recently seen in ED for shortness of breath and was prescribed Augmentin and Prednisone taper. She has completed the prednisone and has one more dose of the Augmentin. Patient has PRN oxycodone and albuterol inhaler. These medications ease symptoms for only about 30 min- 1 hour. Daughter shared that the plan is to move patient to [place] with her and her brother where she will have more support and assistance. Education provided re: Hospice services vs Palliative care services. Reviewed Dr. Ernestina Penna note re: hospice recommendation. Patient and daughter in agreement with Hospice services. Plan is to reach out to hospice MD for approval and will follow up with Dr. Burr Medico.  ?HISTORY OF PRESENT ILLNESS: This is a 56 year old female with gastric adenocarcinoma with metastatic disease to lungs and bones. Palliative care has been asked to follow for additional support, care and complex decision making.  ? ?CODE STATUS: DNR ?Physical Exam: Deferred ?. ? ?  ?Duration: 60 min ? ? ? ?Cornelius Moras, RN ? ?

## 2021-07-06 ENCOUNTER — Telehealth: Payer: Self-pay | Admitting: Physician Assistant

## 2021-07-06 NOTE — Telephone Encounter (Signed)
Sherri from [facility] called requesting that Herb Grays be the "attending of record" for patient during hospice care time.  ? ?Spoke with Herb Grays and she was agreeable. Called Sherri back at [phone] and left message on secure line with the ok from [place]. AS, CMA ?

## 2021-07-10 ENCOUNTER — Encounter: Payer: Self-pay | Admitting: Physician Assistant

## 2021-07-11 ENCOUNTER — Inpatient Hospital Stay: Payer: 59

## 2021-07-11 ENCOUNTER — Inpatient Hospital Stay: Payer: 59 | Admitting: Nurse Practitioner

## 2021-07-11 NOTE — Progress Notes (Signed)
PATIENT NAME: Sheryl Pratt ?DOB: 12/12/1965 ?MRN: [id number] ? ?PRIMARY CARE PROVIDER: Lorrene Reid, PA-C ? ?RESPONSIBLE PARTY:  ?Acct ID - Guarantor Home Phone Work Phone Relationship Acct Type  ?[id number] Sheryl Pratt, Sheryl Pratt* [phone]  Self P/F  ?   Sheryl Pratt, Sheryl Pratt, Sheryl Pratt [address]  ? ?RN telephonic encounter completed today with patient's daughter, Sheryl Pratt. [place] made aware that the order for Hospice eval has been received and that the referral center will be reaching out to her soon. Per Sheryl Pratt, patient has had more difficulty with her breathing over the past 24 hours. [place] made aware that Hospice RN will make visit today and will assist in managing symptoms. Hospice referral department made aware that patient is symptomatic and will need to be seen ASAP.  ? ? ? ?Cornelius Moras, RN ? ?

## 2021-07-12 ENCOUNTER — Inpatient Hospital Stay: Payer: 59

## 2021-07-26 ENCOUNTER — Other Ambulatory Visit: Payer: 59

## 2021-07-26 ENCOUNTER — Ambulatory Visit: Payer: 59

## 2021-07-26 ENCOUNTER — Ambulatory Visit: Payer: 59 | Admitting: Hematology

## 2021-08-21 DEATH — deceased

## 2023-03-13 NOTE — Telephone Encounter (Signed)
Telephone call
